# Patient Record
Sex: Female | Born: 1935
Health system: Southern US, Community
[De-identification: ages and names within clinical notes are randomized; demographics above are authoritative.]

## PROBLEM LIST (undated history)

## (undated) DIAGNOSIS — I10 Essential (primary) hypertension: Secondary | ICD-10-CM

## (undated) DIAGNOSIS — F4321 Adjustment disorder with depressed mood: Secondary | ICD-10-CM

## (undated) DIAGNOSIS — M549 Dorsalgia, unspecified: Secondary | ICD-10-CM

## (undated) DIAGNOSIS — E538 Deficiency of other specified B group vitamins: Secondary | ICD-10-CM

## (undated) DIAGNOSIS — S0300XA Dislocation of jaw, unspecified side, initial encounter: Secondary | ICD-10-CM

## (undated) DIAGNOSIS — E049 Nontoxic goiter, unspecified: Secondary | ICD-10-CM

## (undated) HISTORY — PX: KNEE SURGERY: SHX244

## (undated) HISTORY — PX: THYROIDECTOMY: SHX17

## (undated) HISTORY — DX: Dorsalgia, unspecified: M54.9

## (undated) HISTORY — PX: TUBAL LIGATION: SHX77

## (undated) HISTORY — DX: Deficiency of other specified B group vitamins: E53.8

## (undated) HISTORY — DX: Adjustment disorder with depressed mood: F43.21

## (undated) HISTORY — DX: Nontoxic goiter, unspecified: E04.9

## (undated) HISTORY — DX: Dislocation of jaw, unspecified side, initial encounter: S03.00XA

---

## 1997-10-20 ENCOUNTER — Emergency Department (HOSPITAL_COMMUNITY): Admission: EM | Admit: 1997-10-20 | Discharge: 1997-10-20 | Payer: Self-pay | Admitting: *Deleted

## 1998-08-01 ENCOUNTER — Other Ambulatory Visit: Admission: RE | Admit: 1998-08-01 | Discharge: 1998-08-01 | Payer: Self-pay | Admitting: Gynecology

## 1999-10-26 ENCOUNTER — Other Ambulatory Visit: Admission: RE | Admit: 1999-10-26 | Discharge: 1999-10-26 | Payer: Self-pay | Admitting: Gynecology

## 2000-08-15 ENCOUNTER — Other Ambulatory Visit: Admission: RE | Admit: 2000-08-15 | Discharge: 2000-08-15 | Payer: Self-pay | Admitting: Gynecology

## 2002-08-10 ENCOUNTER — Encounter: Payer: Self-pay | Admitting: Gynecology

## 2002-08-10 ENCOUNTER — Ambulatory Visit (HOSPITAL_COMMUNITY): Admission: RE | Admit: 2002-08-10 | Discharge: 2002-08-10 | Payer: Self-pay | Admitting: Family Medicine

## 2002-08-20 ENCOUNTER — Encounter: Admission: RE | Admit: 2002-08-20 | Discharge: 2002-08-20 | Payer: Self-pay | Admitting: Gynecology

## 2002-08-20 ENCOUNTER — Encounter: Payer: Self-pay | Admitting: Gynecology

## 2003-02-16 ENCOUNTER — Encounter: Admission: RE | Admit: 2003-02-16 | Discharge: 2003-02-16 | Payer: Self-pay | Admitting: Family Medicine

## 2003-02-23 ENCOUNTER — Encounter: Admission: RE | Admit: 2003-02-23 | Discharge: 2003-02-23 | Payer: Self-pay | Admitting: Gastroenterology

## 2003-10-13 ENCOUNTER — Ambulatory Visit (HOSPITAL_COMMUNITY): Admission: RE | Admit: 2003-10-13 | Discharge: 2003-10-13 | Payer: Self-pay | Admitting: Family Medicine

## 2004-01-24 ENCOUNTER — Ambulatory Visit: Payer: Self-pay | Admitting: Internal Medicine

## 2004-06-13 ENCOUNTER — Ambulatory Visit: Payer: Self-pay | Admitting: Family Medicine

## 2004-08-02 ENCOUNTER — Ambulatory Visit (HOSPITAL_COMMUNITY): Admission: RE | Admit: 2004-08-02 | Discharge: 2004-08-02 | Payer: Self-pay | Admitting: Endocrinology

## 2004-08-23 ENCOUNTER — Other Ambulatory Visit: Admission: RE | Admit: 2004-08-23 | Discharge: 2004-08-23 | Payer: Self-pay | Admitting: Interventional Radiology

## 2004-08-23 ENCOUNTER — Encounter (INDEPENDENT_AMBULATORY_CARE_PROVIDER_SITE_OTHER): Payer: Self-pay | Admitting: Specialist

## 2004-08-23 ENCOUNTER — Encounter: Admission: RE | Admit: 2004-08-23 | Discharge: 2004-08-23 | Payer: Self-pay | Admitting: Endocrinology

## 2004-10-17 ENCOUNTER — Ambulatory Visit (HOSPITAL_COMMUNITY): Admission: RE | Admit: 2004-10-17 | Discharge: 2004-10-17 | Payer: Self-pay | Admitting: Family Medicine

## 2004-11-20 ENCOUNTER — Ambulatory Visit: Payer: Self-pay | Admitting: Family Medicine

## 2005-02-08 ENCOUNTER — Ambulatory Visit: Payer: Self-pay | Admitting: Family Medicine

## 2005-02-26 ENCOUNTER — Encounter: Admission: RE | Admit: 2005-02-26 | Discharge: 2005-02-26 | Payer: Self-pay | Admitting: Endocrinology

## 2005-03-20 ENCOUNTER — Ambulatory Visit: Payer: Self-pay | Admitting: Internal Medicine

## 2005-05-22 ENCOUNTER — Ambulatory Visit: Payer: Self-pay | Admitting: Internal Medicine

## 2005-07-16 ENCOUNTER — Encounter: Admission: RE | Admit: 2005-07-16 | Discharge: 2005-07-16 | Payer: Self-pay | Admitting: Endocrinology

## 2005-08-06 ENCOUNTER — Ambulatory Visit: Payer: Self-pay | Admitting: Internal Medicine

## 2005-10-02 ENCOUNTER — Ambulatory Visit: Payer: Self-pay | Admitting: Internal Medicine

## 2005-11-02 ENCOUNTER — Ambulatory Visit (HOSPITAL_COMMUNITY): Admission: RE | Admit: 2005-11-02 | Discharge: 2005-11-02 | Payer: Self-pay | Admitting: Internal Medicine

## 2005-12-04 ENCOUNTER — Ambulatory Visit: Payer: Self-pay | Admitting: Internal Medicine

## 2006-01-09 ENCOUNTER — Encounter: Admission: RE | Admit: 2006-01-09 | Discharge: 2006-01-09 | Payer: Self-pay | Admitting: Endocrinology

## 2006-03-06 ENCOUNTER — Ambulatory Visit: Payer: Self-pay | Admitting: Internal Medicine

## 2006-04-22 ENCOUNTER — Ambulatory Visit: Payer: Self-pay | Admitting: Internal Medicine

## 2006-07-22 ENCOUNTER — Ambulatory Visit: Payer: Self-pay | Admitting: Internal Medicine

## 2006-07-22 LAB — CONVERTED CEMR LAB
BUN: 17 mg/dL (ref 6–23)
CO2: 29 meq/L (ref 19–32)
Calcium: 9.4 mg/dL (ref 8.4–10.5)
Chloride: 109 meq/L (ref 96–112)
Creatinine, Ser: 0.7 mg/dL (ref 0.4–1.2)
GFR calc Af Amer: 107 mL/min
GFR calc non Af Amer: 88 mL/min
Glucose, Bld: 90 mg/dL (ref 70–99)
Potassium: 4.1 meq/L (ref 3.5–5.1)
Sodium: 143 meq/L (ref 135–145)
TSH: 0.76 microintl units/mL (ref 0.35–5.50)
Vit D, 1,25-Dihydroxy: 14 — ABNORMAL LOW (ref 20–57)
Vitamin B-12: 263 pg/mL (ref 211–911)

## 2006-12-10 ENCOUNTER — Ambulatory Visit: Payer: Self-pay | Admitting: Internal Medicine

## 2006-12-13 ENCOUNTER — Ambulatory Visit: Payer: Self-pay | Admitting: Internal Medicine

## 2006-12-20 ENCOUNTER — Ambulatory Visit (HOSPITAL_COMMUNITY): Admission: RE | Admit: 2006-12-20 | Discharge: 2006-12-20 | Payer: Self-pay | Admitting: Internal Medicine

## 2007-01-31 ENCOUNTER — Telehealth: Payer: Self-pay | Admitting: Internal Medicine

## 2007-02-05 ENCOUNTER — Encounter: Payer: Self-pay | Admitting: Internal Medicine

## 2007-02-05 ENCOUNTER — Ambulatory Visit: Payer: Self-pay | Admitting: Internal Medicine

## 2007-02-05 DIAGNOSIS — E538 Deficiency of other specified B group vitamins: Secondary | ICD-10-CM | POA: Insufficient documentation

## 2007-02-05 DIAGNOSIS — E042 Nontoxic multinodular goiter: Secondary | ICD-10-CM | POA: Insufficient documentation

## 2007-02-05 DIAGNOSIS — E559 Vitamin D deficiency, unspecified: Secondary | ICD-10-CM | POA: Insufficient documentation

## 2007-02-05 DIAGNOSIS — E1159 Type 2 diabetes mellitus with other circulatory complications: Secondary | ICD-10-CM | POA: Insufficient documentation

## 2007-02-05 DIAGNOSIS — I10 Essential (primary) hypertension: Secondary | ICD-10-CM | POA: Insufficient documentation

## 2007-02-05 DIAGNOSIS — E039 Hypothyroidism, unspecified: Secondary | ICD-10-CM | POA: Insufficient documentation

## 2007-02-07 ENCOUNTER — Encounter: Admission: RE | Admit: 2007-02-07 | Discharge: 2007-02-07 | Payer: Self-pay | Admitting: Internal Medicine

## 2007-03-17 ENCOUNTER — Telehealth (INDEPENDENT_AMBULATORY_CARE_PROVIDER_SITE_OTHER): Payer: Self-pay | Admitting: *Deleted

## 2007-03-17 ENCOUNTER — Encounter: Payer: Self-pay | Admitting: Internal Medicine

## 2007-03-24 ENCOUNTER — Ambulatory Visit: Payer: Self-pay | Admitting: Internal Medicine

## 2007-03-25 DIAGNOSIS — M26629 Arthralgia of temporomandibular joint, unspecified side: Secondary | ICD-10-CM | POA: Insufficient documentation

## 2007-03-25 DIAGNOSIS — F4321 Adjustment disorder with depressed mood: Secondary | ICD-10-CM | POA: Insufficient documentation

## 2007-04-23 LAB — CONVERTED CEMR LAB: Pap Smear: NORMAL

## 2007-05-12 ENCOUNTER — Encounter (INDEPENDENT_AMBULATORY_CARE_PROVIDER_SITE_OTHER): Payer: Self-pay | Admitting: *Deleted

## 2007-05-12 ENCOUNTER — Ambulatory Visit: Payer: Self-pay | Admitting: Internal Medicine

## 2007-05-12 DIAGNOSIS — R229 Localized swelling, mass and lump, unspecified: Secondary | ICD-10-CM | POA: Insufficient documentation

## 2007-05-12 DIAGNOSIS — J069 Acute upper respiratory infection, unspecified: Secondary | ICD-10-CM | POA: Insufficient documentation

## 2007-05-12 LAB — CONVERTED CEMR LAB
BUN: 20 mg/dL (ref 6–23)
CO2: 30 meq/L (ref 19–32)
Calcium: 9.4 mg/dL (ref 8.4–10.5)
Chloride: 104 meq/L (ref 96–112)
Creatinine, Ser: 0.9 mg/dL (ref 0.4–1.2)
Free T4: 0.8 ng/dL (ref 0.6–1.6)
GFR calc Af Amer: 80 mL/min
GFR calc non Af Amer: 66 mL/min
Glucose, Bld: 93 mg/dL (ref 70–99)
Potassium: 4.3 meq/L (ref 3.5–5.1)
Sodium: 141 meq/L (ref 135–145)
TSH: 0.69 microintl units/mL (ref 0.35–5.50)

## 2007-05-15 ENCOUNTER — Encounter: Payer: Self-pay | Admitting: Internal Medicine

## 2007-06-13 ENCOUNTER — Encounter: Admission: RE | Admit: 2007-06-13 | Discharge: 2007-06-13 | Payer: Self-pay | Admitting: Otolaryngology

## 2007-07-02 ENCOUNTER — Encounter: Payer: Self-pay | Admitting: Internal Medicine

## 2007-07-24 ENCOUNTER — Encounter (INDEPENDENT_AMBULATORY_CARE_PROVIDER_SITE_OTHER): Payer: Self-pay | Admitting: Diagnostic Radiology

## 2007-07-24 ENCOUNTER — Other Ambulatory Visit: Admission: RE | Admit: 2007-07-24 | Discharge: 2007-07-24 | Payer: Self-pay | Admitting: Diagnostic Radiology

## 2007-07-24 ENCOUNTER — Encounter: Payer: Self-pay | Admitting: Internal Medicine

## 2007-07-24 ENCOUNTER — Encounter: Admission: RE | Admit: 2007-07-24 | Discharge: 2007-07-24 | Payer: Self-pay | Admitting: Otolaryngology

## 2007-10-03 ENCOUNTER — Ambulatory Visit: Payer: Self-pay | Admitting: Internal Medicine

## 2007-10-03 DIAGNOSIS — M549 Dorsalgia, unspecified: Secondary | ICD-10-CM | POA: Insufficient documentation

## 2007-11-03 ENCOUNTER — Telehealth: Payer: Self-pay | Admitting: Internal Medicine

## 2007-11-13 ENCOUNTER — Encounter (INDEPENDENT_AMBULATORY_CARE_PROVIDER_SITE_OTHER): Payer: Self-pay | Admitting: Otolaryngology

## 2007-11-13 ENCOUNTER — Ambulatory Visit (HOSPITAL_COMMUNITY): Admission: RE | Admit: 2007-11-13 | Discharge: 2007-11-15 | Payer: Self-pay | Admitting: Otolaryngology

## 2007-11-13 ENCOUNTER — Encounter: Payer: Self-pay | Admitting: Internal Medicine

## 2007-12-03 ENCOUNTER — Encounter: Payer: Self-pay | Admitting: Internal Medicine

## 2007-12-22 ENCOUNTER — Ambulatory Visit (HOSPITAL_COMMUNITY): Admission: RE | Admit: 2007-12-22 | Discharge: 2007-12-22 | Payer: Self-pay | Admitting: Internal Medicine

## 2008-01-06 ENCOUNTER — Encounter: Payer: Self-pay | Admitting: Internal Medicine

## 2008-03-25 ENCOUNTER — Ambulatory Visit: Payer: Self-pay | Admitting: Internal Medicine

## 2008-03-25 DIAGNOSIS — K219 Gastro-esophageal reflux disease without esophagitis: Secondary | ICD-10-CM | POA: Insufficient documentation

## 2008-03-25 LAB — CONVERTED CEMR LAB
BUN: 18 mg/dL (ref 6–23)
CO2: 29 meq/L (ref 19–32)
Calcium: 9.4 mg/dL (ref 8.4–10.5)
Chloride: 104 meq/L (ref 96–112)
Cholesterol: 206 mg/dL (ref 0–200)
Creatinine, Ser: 0.8 mg/dL (ref 0.4–1.2)
Direct LDL: 110.9 mg/dL
Folate: 11.2 ng/mL
Free T4: 0.8 ng/dL (ref 0.6–1.6)
GFR calc Af Amer: 91 mL/min
GFR calc non Af Amer: 75 mL/min
Glucose, Bld: 98 mg/dL (ref 70–99)
HDL: 39.4 mg/dL (ref >39.0)
Potassium: 4 meq/L (ref 3.5–5.1)
Sodium: 139 meq/L (ref 135–145)
TSH: 2.21 microintl units/mL (ref 0.35–5.50)
Total CHOL/HDL Ratio: 5.2
Triglycerides: 343 mg/dL (ref 0–149)
VLDL: 69 mg/dL — ABNORMAL HIGH (ref 0–40)
Vitamin B-12: 714 pg/mL (ref 211–911)

## 2008-03-28 ENCOUNTER — Telehealth: Payer: Self-pay | Admitting: Internal Medicine

## 2008-03-28 LAB — CONVERTED CEMR LAB: Vit D, 1,25-Dihydroxy: 37 (ref 30–89)

## 2008-03-30 ENCOUNTER — Encounter: Payer: Self-pay | Admitting: Internal Medicine

## 2008-04-09 HISTORY — PX: EYE SURGERY: SHX253

## 2008-06-29 ENCOUNTER — Encounter: Payer: Self-pay | Admitting: Internal Medicine

## 2008-06-29 ENCOUNTER — Telehealth: Payer: Self-pay | Admitting: Internal Medicine

## 2008-07-12 ENCOUNTER — Encounter: Payer: Self-pay | Admitting: Internal Medicine

## 2009-01-20 ENCOUNTER — Ambulatory Visit (HOSPITAL_COMMUNITY): Admission: RE | Admit: 2009-01-20 | Discharge: 2009-01-20 | Payer: Self-pay | Admitting: Internal Medicine

## 2009-01-26 ENCOUNTER — Encounter: Payer: Self-pay | Admitting: Internal Medicine

## 2009-01-26 ENCOUNTER — Ambulatory Visit: Payer: Self-pay | Admitting: Internal Medicine

## 2009-02-21 ENCOUNTER — Encounter: Payer: Self-pay | Admitting: Internal Medicine

## 2009-02-28 ENCOUNTER — Telehealth: Payer: Self-pay | Admitting: Internal Medicine

## 2009-03-16 ENCOUNTER — Telehealth: Payer: Self-pay | Admitting: Internal Medicine

## 2009-08-25 ENCOUNTER — Telehealth: Payer: Self-pay | Admitting: Internal Medicine

## 2009-10-07 ENCOUNTER — Ambulatory Visit: Payer: Self-pay | Admitting: Internal Medicine

## 2009-10-20 ENCOUNTER — Ambulatory Visit: Payer: Self-pay | Admitting: Internal Medicine

## 2009-10-20 DIAGNOSIS — E1169 Type 2 diabetes mellitus with other specified complication: Secondary | ICD-10-CM | POA: Insufficient documentation

## 2009-10-20 DIAGNOSIS — E781 Pure hyperglyceridemia: Secondary | ICD-10-CM

## 2009-10-20 LAB — CONVERTED CEMR LAB
ALT: 23 units/L (ref 0–35)
AST: 22 units/L (ref 0–37)
Albumin: 4.1 g/dL (ref 3.5–5.2)
Alkaline Phosphatase: 46 units/L (ref 39–117)
BUN: 25 mg/dL — ABNORMAL HIGH (ref 6–23)
Bilirubin, Direct: 0.1 mg/dL (ref 0.0–0.3)
CO2: 27 meq/L (ref 19–32)
Calcium: 9.1 mg/dL (ref 8.4–10.5)
Chloride: 102 meq/L (ref 96–112)
Cholesterol: 294 mg/dL — ABNORMAL HIGH (ref 0–200)
Creatinine, Ser: 0.9 mg/dL (ref 0.4–1.2)
Direct LDL: 72.1 mg/dL
Folate: 14.3 ng/mL
Free T4: 0.8 ng/dL (ref 0.60–1.60)
GFR calc non Af Amer: 63.78 mL/min (ref >60)
Glucose, Bld: 95 mg/dL (ref 70–99)
HDL: 40.9 mg/dL (ref >39.00)
Potassium: 4 meq/L (ref 3.5–5.1)
Sodium: 139 meq/L (ref 135–145)
TSH: 2.27 microintl units/mL (ref 0.35–5.50)
Total Bilirubin: 0.5 mg/dL (ref 0.3–1.2)
Total CHOL/HDL Ratio: 7
Total Protein: 7.1 g/dL (ref 6.0–8.3)
Triglycerides: 1371 mg/dL — ABNORMAL HIGH (ref 0.0–149.0)
VLDL: 274.2 mg/dL — ABNORMAL HIGH (ref 0.0–40.0)
Vit D, 25-Hydroxy: 27 ng/mL — ABNORMAL LOW (ref 30–89)
Vitamin B-12: 432 pg/mL (ref 211–911)

## 2009-10-23 ENCOUNTER — Telehealth: Payer: Self-pay | Admitting: Internal Medicine

## 2010-01-03 ENCOUNTER — Telehealth (INDEPENDENT_AMBULATORY_CARE_PROVIDER_SITE_OTHER): Payer: Self-pay | Admitting: *Deleted

## 2010-01-31 ENCOUNTER — Ambulatory Visit (HOSPITAL_COMMUNITY): Admission: RE | Admit: 2010-01-31 | Discharge: 2010-01-31 | Payer: Self-pay | Admitting: Internal Medicine

## 2010-02-20 ENCOUNTER — Ambulatory Visit: Payer: Self-pay | Admitting: Internal Medicine

## 2010-04-25 ENCOUNTER — Telehealth: Payer: Self-pay | Admitting: Internal Medicine

## 2010-04-26 ENCOUNTER — Telehealth: Payer: Self-pay | Admitting: Internal Medicine

## 2010-04-27 ENCOUNTER — Ambulatory Visit
Admission: RE | Admit: 2010-04-27 | Discharge: 2010-04-27 | Payer: Self-pay | Source: Home / Self Care | Attending: Internal Medicine | Admitting: Internal Medicine

## 2010-04-27 DIAGNOSIS — M79609 Pain in unspecified limb: Secondary | ICD-10-CM | POA: Insufficient documentation

## 2010-04-30 ENCOUNTER — Encounter: Payer: Self-pay | Admitting: Endocrinology

## 2010-04-30 ENCOUNTER — Encounter: Payer: Self-pay | Admitting: Internal Medicine

## 2010-05-11 NOTE — Progress Notes (Signed)
  Phone Note Outgoing Call   Reason for Call: Discuss lab or test results Summary of Call: please call patient:  1. triglycerides were very high and she will need to start tricor - Rx to pharmacy 2. Vit D was low - she will need D3 50,000 weekly - Rx to pharmacy 3. repeat labs 4 weeks after starting tricor  Thanks Initial call taken by: Jacques Navy MD,  October 23, 2009 6:09 PM  Follow-up for Phone Call        lmoam for pt to call back Follow-up by: Ami Bullins CMA,  October 24, 2009 11:20 AM  Additional Follow-up for Phone Call Additional follow up Details #1::        informed pt Additional Follow-up by: Ami Bullins CMA,  October 24, 2009 2:45 PM

## 2010-05-11 NOTE — Assessment & Plan Note (Signed)
Summary: BREAST IS BRUISED AND BACK PAIN AFTER MAMMOGRAM ON 10-25 /NWS   Vital Signs:  Patient profile:   75 year old female Height:      63 inches Weight:      177 pounds BMI:     31.47 O2 Sat:      96 % on Room air Temp:     98.5 degrees F oral Pulse rate:   67 / minute BP sitting:   132 / 80  (left arm) Cuff size:   regular  Vitals Entered By: Bill Salinas CMA (February 20, 2010 1:10 PM)  O2 Flow:  Room air CC: pt had a mammogram on  Jan 31, 2010, she states since that exam she has had continued left breast soreness with back pain/ ab   Primary Care Ed Rayson:  [facility]  CC:  pt had a mammogram on  Jan 31, 2010, and she states since that exam she has had continued left breast soreness with back pain/ ab.  History of Present Illness: Mrs. Rocha presents today with pain under her left breast and back that has been there since having a mammogram on October 25th.  While having the mammogram her breast was smashed much harder than it has ever been before.  She states that at the time she noticed that she even had some blood blister develope due to the pressure of the machine.  Two days after she noticed that her nipples turned black, and this eventually resolved.  Since then she has had a pain under her left breast that feels more achy in nature.  She states that this pain is present when she presses the area as well.  The pain in her back is more of a deep sharp pain that is present under her scapula.  She only notices the pain when she breaths.  She has tried Aleve to help with the pain but it did not.  She states that it is not getting worse, but it is also not gettingany better.  The pain is not bad enough to keep her from sleeping or wake her from sleep.    She denies any abdominal pain, SOB with exertion, chest pain, palpitations, tingling or numbness in her jaw or extremities.    Current Medications (verified): 1)  Levothroid 50 Mcg Tabs (Levothyroxine Sodium) .Marland Kitchen.. 1 Qd 2)   Lovastatin 20 Mg Tabs (Lovastatin) .Marland Kitchen.. 1 Qd 3)  Vitamin D3 1000 Unit  Tabs (Cholecalciferol) .Marland Kitchen.. 1 Qd 4)  Vitamin B-12 1000 Mcg  Tabs (Cyanocobalamin) .Marland Kitchen.. 1 Qd 5)  Aleve 220 Mg  Tabs (Naproxen Sodium) .... As Needed 6)  Calcium 600 1500 Mg  Tabs (Calcium Carbonate) .... Take 1 Tablet By Mouth Two Times A Day 7)  Adult Aspirin Low Strength 81 Mg  Tbdp (Aspirin) .... Take 1 Tablet By Mouth Once A Day 8)  Alprazolam 0.25 Mg  Tabs (Alprazolam) .... As Directed As Needed 9)  Bystolic 10 Mg Tabs (Nebivolol Hcl) .Marland Kitchen.. 1 By Mouth Once Daily 10)  Furosemide 20 Mg Tabs (Furosemide) .Marland Kitchen.. 1po Once Daily 11)  Ranitidine Hcl 150 Mg Caps (Ranitidine Hcl) .Marland Kitchen.. 1 By Mouth Two Times A Day 12)  Neomycin-Polymyxin-Hc 3.5-10000-1 Soln (Neomycin-Polymyxin-Hc) .... 4 Qtts As Three Times A Day 13)  Tricor 48 Mg Tabs (Fenofibrate) .Marland Kitchen.. 1 By Mouth Q Pm 14)  Vitamin D (Ergocalciferol) 50000 Unit Caps (Ergocalciferol) .Marland Kitchen.. 1 By Mouth Weekly X 8 Weeks  Allergies (verified): 1)  ! Codeine  Past History:  Past Medical  History: Last updated: 10/03/2007 Goiter B12 def Vit D def TMJ right grief and loss related depression '08 back pain - chronic  Past Surgical History: Last updated: 03/25/2008 knee surgery-right arthroscopy '05  Dr. Charlann Boxer Tubal ligation Thyroidectomy-August '09 for goiter (Dr. Jearld Fenton)  Family History: Last updated: 02/05/2007 Family History of CAD Female 1st degree relative <50  Social History: Last updated: 10/20/2009 HSG, maiden 2 sons- 2060/09/11 (died Lung cancer 07-Sep-2022), 09-12-67; 3 grandchildren Retired- cutter in Scientist, clinical (histocompatibility and immunogenetics); work in [employer]. Lives alone: I-ADLs, lives in Sr Citizen's complex. End of life issues: patient would want CPR, DNI, not to be maintained in a vegative state. Laymen's guide packet provided (July '11)  Risk Factors: Alcohol Use: 0 (10/20/2009) Caffeine Use: 0 (03/25/2008) Exercise: no (03/25/2008)  Risk Factors: Smoking Status:  never (10/20/2009)  Review of Systems  The patient denies anorexia, fever, weight loss, weight gain, vision loss, decreased hearing, hoarseness, chest pain, syncope, dyspnea on exertion, peripheral edema, prolonged cough, headaches, hemoptysis, abdominal pain, melena, hematochezia, severe indigestion/heartburn, hematuria, incontinence, muscle weakness, suspicious skin lesions, difficulty walking, depression, unusual weight change, abnormal bleeding, and breast masses.    Physical Exam  General:  alert, well-developed, and well-nourished.   Head:  normocephalic and atraumatic.   Eyes:  pupils equal, pupils round, pupils reactive to light, pupils react to accomodation, and corneas and lenses clear.   Ears:  R ear normal and L ear normal.   Nose:  no external deformity, no external erythema, and no nasal discharge.   Mouth:  good dentition, pharynx pink and moist, no erythema, and no exudates.   Neck:  supple, full ROM, no masses, and no cervical lymphadenopathy.   Chest Wall:  no deformities and no mass.  Tenderness to palpation under left breast - in the T5 dermatome/ in the intercostal space under the breast and in the intercostal space (same space) at the lower aspect of the scapula  Breasts:  skin/areolae normal, no abnormal thickening, no nipple discharge, no tenderness, and no adenopathy.  A small cystic structure felt in the 9-7 o'clock position of the left breast - felt on previous exams.  Slight left nipple retraction - normal.  No signs of bruising. Lungs:  normal respiratory effort, normal breath sounds, no dullness, no crackles, and no wheezes.   Heart:  normal rate, regular rhythm, no murmur, no gallop, no rub, and PMI normal.   Abdomen:  soft, non-tender, normal bowel sounds, no distention, no masses, no guarding, no rigidity, no abdominal hernia, and no hepatomegaly.   Msk:  Pain in the T5 dermatome on the with deep breathing.  Pain to palpation in the region of the T5 dermatome.   normal ROM and no joint tenderness.   Pulses:  R radial normal, R posterior tibial normal, R dorsalis pedis normal, L radial normal, L posterior tibial normal, and L dorsalis pedis normal.   Extremities:  No edema, cyanosis, clubbing Neurologic:  alert & oriented X3, cranial nerves II-XII intact, and gait normal.   Skin:  turgor normal and color normal.   Cervical Nodes:  no anterior cervical adenopathy and no posterior cervical adenopathy.   Axillary Nodes:  no R axillary adenopathy and no L axillary adenopathy.   Psych:  Oriented X3, memory intact for recent and remote, normally interactive, good eye contact, not anxious appearing, and not depressed appearing.     Impression & Recommendations:  Problem # 1:  BACK PAIN (ICD-724.5) The back pain she is having is either a  MSK problem vs. nerve damage 2/2 mammogram vs. Cardiac vs. pulmonary.  The pain does not seem to be cardiac because it can be replicated on physical exam and she has no signs of MI.  Since the lungs are clear and she has no SOB pluritic pain seems to be less likely. The pain that she if feeling seems to be secondary to the consequences from her mamogram.  SInce it can be localized to a specific dermatome and the area is the same both dorsally and ventrally what probably happened is that the trauma of the mammogram bruised her anteriorly and cause irritation of the intercostal nerve.  This pain is worse when she breaths because it is moving ner nerve and muscles in the costal space.  Plan:  Try heat on area of pain.           Continue Aleve for pain as needed.  Her updated medication list for this problem includes:    Aleve 220 Mg Tabs (Naproxen sodium) .Marland Kitchen... As needed    Adult Aspirin Low Strength 81 Mg Tbdp (Aspirin) .Marland Kitchen... Take 1 tablet by mouth once a day  Complete Medication List: 1)  Levothroid 50 Mcg Tabs (Levothyroxine sodium) .Marland Kitchen.. 1 qd 2)  Lovastatin 20 Mg Tabs (Lovastatin) .Marland Kitchen.. 1 qd 3)  Vitamin D3 1000 Unit Tabs  (Cholecalciferol) .Marland Kitchen.. 1 qd 4)  Vitamin B-12 1000 Mcg Tabs (Cyanocobalamin) .Marland Kitchen.. 1 qd 5)  Aleve 220 Mg Tabs (Naproxen sodium) .... As needed 6)  Calcium 600 1500 Mg Tabs (Calcium carbonate) .... Take 1 tablet by mouth two times a day 7)  Adult Aspirin Low Strength 81 Mg Tbdp (Aspirin) .... Take 1 tablet by mouth once a day 8)  Alprazolam 0.25 Mg Tabs (Alprazolam) .... As directed as needed 9)  Bystolic 10 Mg Tabs (Nebivolol hcl) .Marland Kitchen.. 1 by mouth once daily 10)  Furosemide 20 Mg Tabs (Furosemide) .Marland Kitchen.. 1po once daily 11)  Ranitidine Hcl 150 Mg Caps (Ranitidine hcl) .Marland Kitchen.. 1 by mouth two times a day 12)  Neomycin-polymyxin-hc 3.5-10000-1 Soln (Neomycin-polymyxin-hc) .... 4 qtts as three times a day 13)  Tricor 48 Mg Tabs (Fenofibrate) .Marland Kitchen.. 1 by mouth q pm 14)  Vitamin D (ergocalciferol) 50000 Unit Caps (Ergocalciferol) .Marland Kitchen.. 1 by mouth weekly x 8 weeks Prescriptions: BYSTOLIC 10 MG TABS (NEBIVOLOL HCL) 1 by mouth once daily  #30 Each x 12   Entered and Authorized by:   Jacques Navy MD   Signed by:   Jacques Navy MD on 02/20/2010   Method used:   Electronically to        [facility] [address] (retail)       [phone]       [place], [place]  [address]       Ph: [phone]       Fax: [phone]   RxID:   [id number] LEVOTHROID 50 MCG TABS (LEVOTHYROXINE SODIUM) 1 qd  #90 Each x 3   Entered and Authorized by:   Jacques Navy MD   Signed by:   Jacques Navy MD on 02/20/2010   Method used:   Electronically to        [facility] [address] (retail)       [phone]       [place], [place]  [address]       Ph: [phone]  Fax: [phone]   RxID:   [id number]    Orders Added: 1)  Est. Patient Level III [[address]]

## 2010-05-11 NOTE — Progress Notes (Signed)
    Immunization History:  Influenza Immunization History:    Influenza:  left deltoid lot # [id number] 0.66ml (01/03/2010)

## 2010-05-11 NOTE — Progress Notes (Signed)
Summary: KNOT ON FOOT - ov tomorrow  Phone Note Call from Patient Call back at Home Phone [phone]   Summary of Call: Ret call - wants a call back.  Initial call taken by: Lamar Sprinkles, CMA,  April 26, 2010 4:36 PM  Follow-up for Phone Call        scheduled for office visit tomorrow for eval - has painful knot on left foot x 1 mth. Advise eval by PCP first, pt agreed Follow-up by: Lamar Sprinkles, CMA,  April 26, 2010 4:42 PM

## 2010-05-11 NOTE — Assessment & Plan Note (Signed)
Summary: BLEEDING LEFT EAR  STC   Vital Signs:  Patient profile:   75 year old female Height:      63 inches Weight:      179 pounds BMI:     31.82 O2 Sat:      96 % on Room air Temp:     98.1 degrees F oral Pulse rate:   63 / minute BP sitting:   158 / 90  (left arm) Cuff size:   regular  Vitals Entered By: Bill Salinas CMA (October 07, 2009 1:57 PM)  O2 Flow:  Room air CC: pt here with c/o bleeding from Left ear. Pt had episode of dizziness about 1 month ago/ ab   Primary Care Provider:  Norins  CC:  pt here with c/o bleeding from Left ear. Pt had episode of dizziness about 1 month ago/ ab.  History of Present Illness: Patient is seen acutely for blood coming from the left ear. No pain, no change in hearing. This started [facility] and has continue with small amounts of blood since. She does use "Q" tips to clean her ears. No other recollection of trauma or FB in the ear.   Current Medications (verified): 1)  Levothroid 50 Mcg Tabs (Levothyroxine Sodium) .Marland Kitchen.. 1 Qd 2)  Lovastatin 20 Mg Tabs (Lovastatin) .Marland Kitchen.. 1 Qd 3)  Vitamin D3 1000 Unit  Tabs (Cholecalciferol) .Marland Kitchen.. 1 Qd 4)  Vitamin B-12 1000 Mcg  Tabs (Cyanocobalamin) .Marland Kitchen.. 1 Qd 5)  Aleve 220 Mg  Tabs (Naproxen Sodium) .... As Needed 6)  Calcium 600 1500 Mg  Tabs (Calcium Carbonate) .... Take 1 Tablet By Mouth Two Times A Day 7)  Adult Aspirin Low Strength 81 Mg  Tbdp (Aspirin) .... Take 1 Tablet By Mouth Once A Day 8)  Alprazolam 0.25 Mg  Tabs (Alprazolam) .... As Directed As Needed 9)  Bystolic 10 Mg Tabs (Nebivolol Hcl) .Marland Kitchen.. 1 By Mouth Once Daily 10)  Furosemide 20 Mg Tabs (Furosemide) .Marland Kitchen.. 1po Once Daily 11)  Ranitidine Hcl 150 Mg Caps (Ranitidine Hcl) .Marland Kitchen.. 1 By Mouth Two Times A Day  Allergies: 1)  ! Codeine PMH-FH-SH reviewed-no changes except otherwise noted  Review of Systems  The patient denies anorexia, fever, weight loss, weight gain, vision loss, decreased hearing, chest pain, peripheral edema, abdominal pain,  hematochezia, incontinence, suspicious skin lesions, difficulty walking, and abnormal bleeding.    Physical Exam  General:  Well-developed,well-nourished,in no acute distress; alert,appropriate and cooperative throughout examination Head:  normocephalic and atraumatic.   Ears:  Right EAC normal. Left EAC with excoriation on the posterior wall with some dried blood on the floor of the canal.  Neck:  supple and full ROM.   Lungs:  normal respiratory effort.   Heart:  normal rate and regular rhythm.   Msk:  normal ROM.   Neurologic:  alert & oriented X3, cranial nerves II-XII intact, strength normal in all extremities, and gait normal.   Skin:  turgor normal and color normal.   Cervical Nodes:  no anterior cervical adenopathy and no posterior cervical adenopathy.     Impression & Recommendations:  Problem # 1:  INJURY OF FACE AND NECK OTHER AND UNSPECIFIED (ICD-959.09) Patient with excoriation of Left EAC as source of bleeding. TM looked normal.   Plan - neomycin/polymiinB/hydrocotrisone qtts 4 qtts to AS three times a day  Complete Medication List: 1)  Levothroid 50 Mcg Tabs (Levothyroxine sodium) .Marland Kitchen.. 1 qd 2)  Lovastatin 20 Mg Tabs (Lovastatin) .Marland Kitchen.. 1 qd 3)  Vitamin D3 1000 Unit Tabs (Cholecalciferol) .Marland Kitchen.. 1 qd 4)  Vitamin B-12 1000 Mcg Tabs (Cyanocobalamin) .Marland Kitchen.. 1 qd 5)  Aleve 220 Mg Tabs (Naproxen sodium) .... As needed 6)  Calcium 600 1500 Mg Tabs (Calcium carbonate) .... Take 1 tablet by mouth two times a day 7)  Adult Aspirin Low Strength 81 Mg Tbdp (Aspirin) .... Take 1 tablet by mouth once a day 8)  Alprazolam 0.25 Mg Tabs (Alprazolam) .... As directed as needed 9)  Bystolic 10 Mg Tabs (Nebivolol hcl) .Marland Kitchen.. 1 by mouth once daily 10)  Furosemide 20 Mg Tabs (Furosemide) .Marland Kitchen.. 1po once daily 11)  Ranitidine Hcl 150 Mg Caps (Ranitidine hcl) .Marland Kitchen.. 1 by mouth two times a day 12)  Neomycin-polymyxin-hc 3.5-10000-1 Soln (Neomycin-polymyxin-hc) .... 4 qtts as three times a  day Prescriptions: ALPRAZOLAM 0.25 MG  TABS (ALPRAZOLAM) as directed as needed  #60 x 2   Entered and Authorized by:   Jacques Navy MD   Signed by:   Jacques Navy MD on 10/07/2009   Method used:   Handwritten   RxID:   [id number] FUROSEMIDE 20 MG TABS (FUROSEMIDE) 1po once daily  #30 Each x 12   Entered and Authorized by:   Jacques Navy MD   Signed by:   Jacques Navy MD on 10/07/2009   Method used:   Electronically to        [facility] [address] (retail)       [phone]       [place], [place]  [address]       Ph: [phone]       Fax: [phone]   RxID:   [id number] LOVASTATIN 20 MG TABS (LOVASTATIN) 1 qd  #90 Each x 1   Entered and Authorized by:   Jacques Navy MD   Signed by:   Jacques Navy MD on 10/07/2009   Method used:   Electronically to        [facility] [address] (retail)       [phone]       [place], [place]  [address]       Ph: [phone]       Fax: [phone]   RxID:   [id number] NEOMYCIN-POLYMYXIN-HC 3.5-10000-1 SOLN [facility]) 4 qtts AS three times a day  #10cc x 0   Entered and Authorized by:   Jacques Navy MD   Signed by:   Jacques Navy MD on 10/07/2009   Method used:   Electronically to        [facility] [address] (retail)       [phone]       [place], [place]  [address]       Ph: [phone]       Fax: [phone]   RxID:   [id number]    Preventive Care Screening  Last Flu Shot:    Date:  01/31/2009    Results:  given   Bone Density:    Date:  01/26/2009    Results:  normal std dev  Colonoscopy:    Date:  02/09/2003    Results:  normal

## 2010-05-11 NOTE — Assessment & Plan Note (Signed)
Summary: PER MD EXT OV--BACK AND BODY ACHES AND SWELLING--STC   Vital Signs:  Patient profile:   75 year old female Height:      63 inches Weight:      181 pounds BMI:     32.18 O2 Sat:      96 % on Room air Temp:     98.7 degrees F oral Pulse rate:   64 / minute BP sitting:   136 / 78  (left arm) Cuff size:   regular  Vitals Entered By: Bill Salinas CMA (October 20, 2009 2:54 PM)  O2 Flow:  Room air CC: pt here for cpx/ ab   Primary Care Provider:  Norins  CC:  pt here for cpx/ ab.  History of Present Illness: Patient presents for routine exam. She is feelng well. she is current with mammography. Still having some back pain but this is usual.  Preventive Screening-Counseling & Management  Alcohol-Tobacco     Alcohol drinks/day: 0     Smoking Status: never  Current Medications (verified): 1)  Levothroid 50 Mcg Tabs (Levothyroxine Sodium) .Marland Kitchen.. 1 Qd 2)  Lovastatin 20 Mg Tabs (Lovastatin) .Marland Kitchen.. 1 Qd 3)  Vitamin D3 1000 Unit  Tabs (Cholecalciferol) .Marland Kitchen.. 1 Qd 4)  Vitamin B-12 1000 Mcg  Tabs (Cyanocobalamin) .Marland Kitchen.. 1 Qd 5)  Aleve 220 Mg  Tabs (Naproxen Sodium) .... As Needed 6)  Calcium 600 1500 Mg  Tabs (Calcium Carbonate) .... Take 1 Tablet By Mouth Two Times A Day 7)  Adult Aspirin Low Strength 81 Mg  Tbdp (Aspirin) .... Take 1 Tablet By Mouth Once A Day 8)  Alprazolam 0.25 Mg  Tabs (Alprazolam) .... As Directed As Needed 9)  Bystolic 10 Mg Tabs (Nebivolol Hcl) .Marland Kitchen.. 1 By Mouth Once Daily 10)  Furosemide 20 Mg Tabs (Furosemide) .Marland Kitchen.. 1po Once Daily 11)  Ranitidine Hcl 150 Mg Caps (Ranitidine Hcl) .Marland Kitchen.. 1 By Mouth Two Times A Day 12)  Neomycin-Polymyxin-Hc 3.5-10000-1 Soln (Neomycin-Polymyxin-Hc) .... 4 Qtts As Three Times A Day  Allergies (verified): 1)  ! Codeine  Past History:  Past Medical History: Last updated: 10/03/2007 Goiter B12 def Vit D def TMJ right grief and loss related depression '08 back pain - chronic  Past Surgical History: Last updated:  03/25/2008 knee surgery-right arthroscopy '05  Dr. Charlann Boxer Tubal ligation Thyroidectomy-August '09 for goiter (Dr. Jearld Fenton)  Family History: Last updated: 02/05/2007 Family History of CAD Female 1st degree relative <50  Social History: Last updated: 10/20/2009 HSG, maiden 2 sons- 2060/08/21 (died Lung cancer 08-17-2022), 08-22-67; 3 grandchildren Retired- cutter in Scientist, clinical (histocompatibility and immunogenetics); work in [employer]. Lives alone: I-ADLs, lives in Sr Citizen's complex. End of life issues: patient would want CPR, DNI, not to be maintained in a vegative state. Laymen's guide packet provided (July '11)  Risk Factors: Smoking Status: never (10/20/2009)  Social History: HSG, maiden 2 sons- 08/21/60 (died Lung cancer 08/17/22), Aug 22, 2067; 3 grandchildren RetiredProduction manager in Scientist, clinical (histocompatibility and immunogenetics); work in [employer]. Lives alone: I-ADLs, lives in Sr Citizen's complex. End of life issues: patient would want CPR, DNI, not to be maintained in a vegative state. Laymen's guide packet provided (July '11)  Review of Systems       The patient complains of peripheral edema and severe indigestion/heartburn.  The patient denies anorexia, fever, weight loss, weight gain, vision loss, decreased hearing, chest pain, dyspnea on exertion, prolonged cough, abdominal pain, incontinence, muscle weakness, suspicious skin lesions, difficulty walking, depression, abnormal bleeding, enlarged lymph nodes, angioedema, and breast  masses.    Physical Exam  General:  WNWD white female in no distress Head:  normocephalic and atraumatic.   Eyes:  No corneal or conjunctival inflammation noted. EOMI. Perrla. Funduscopic exam benign, without hemorrhages, exudates or papilledema. Vision grossly normal. IOLs clear Ears:  External ear exam shows no significant lesions or deformities.  Otoscopic examination reveals clear canals, tympanic membranes are intact bilaterally without bulging, retraction, inflammation or  discharge. Hearing is grossly normal bilaterally. Nose:  no external deformity and no external erythema.   Mouth:  Oral mucosa and oropharynx without lesions or exudates.  Teeth in good repair. Neck:  supple, no masses, no thyromegaly, and no carotid bruits.   Chest Wall:  No deformities, masses, or tenderness noted. Breasts:  No mass, nodules, thickening, tenderness, bulging, retraction, inflamation, nipple discharge or skin changes noted.  Mild fibrocystic changes noted Lungs:  Normal respiratory effort, chest expands symmetrically. Lungs are clear to auscultation, no crackles or wheezes. Heart:  Normal rate and regular rhythm. S1 and S2 normal without gallop, murmur, click, rub or other extra sounds. Abdomen:  soft, non-tender, normal bowel sounds, no guarding, no rigidity, and no hepatomegaly.   Msk:  normal ROM, no joint tenderness, no joint swelling, no joint warmth, no joint deformities, and no joint instability.   Pulses:  2+ radial and DP pulses Extremities:  No clubbing, cyanosis, edema, or deformity noted with normal full range of motion of all joints.   Neurologic:  alert & oriented X3, cranial nerves II-XII intact, strength normal in all extremities, gait normal, and DTRs symmetrical and normal.   Skin:  turgor normal, color normal, no rashes, and no suspicious lesions.   Cervical Nodes:  no anterior cervical adenopathy and no posterior cervical adenopathy.   Axillary Nodes:  no R axillary adenopathy and no L axillary adenopathy.   Psych:  Oriented X3, memory intact for recent and remote, normally interactive, good eye contact, and not anxious appearing.     Impression & Recommendations:  Problem # 1:  OTHER AND UNSPECIFIED HYPERLIPIDEMIA (ICD-272.4)  Her updated medication list for this problem includes:    Lovastatin 20 Mg Tabs (Lovastatin) .Marland Kitchen... 1 qd    Tricor 48 Mg Tabs (Fenofibrate) .Marland Kitchen... 1 by mouth q pm  Orders: TLB-Lipid Panel (80061-LIPID) TLB-Hepatic/Liver Function  Pnl (80076-HEPATIC)  Paient with markedly elevated triglycerides with an LDL of 72.   Plan - continue low dose lovastatin           add tricor 48mg  by mouth at dinner           low at diet  Problem # 2:  GERD (ICD-530.81) stable on present medication  Her updated medication list for this problem includes:    Ranitidine Hcl 150 Mg Caps (Ranitidine hcl) .Marland Kitchen... 1 by mouth two times a day  Problem # 3:  HYPOTHYROIDISM, UNSPECIFIED (ICD-244.9)  Her updated medication list for this problem includes:    Levothroid 50 Mcg Tabs (Levothyroxine sodium) .Marland Kitchen... 1 qd  Orders: TLB-TSH (Thyroid Stimulating Hormone) (84443-TSH) TLB-T4 (Thyrox), Free [phone])  Lab results indicate good control on present medication  Problem # 4:  HYPERTENSION (ICD-401.1)  Her updated medication list for this problem includes:    Bystolic 10 Mg Tabs (Nebivolol hcl) .Marland Kitchen... 1 by mouth once daily    Furosemide 20 Mg Tabs (Furosemide) .Marland Kitchen... 1po once daily  Orders: TLB-BMP (Basic Metabolic Panel-BMET) (80048-METABOL)  BP today: 136/78 Prior BP: 158/90 (10/07/2009)  Adequate control on present medications - will continue the  same.  Problem # 5:  VITAMIN D DEFICIENCY (ICD-268.9)  Orders: T-Vitamin D (25-Hydroxy) ([id number])  Vitamin D level 27 = insufficient  Plan - Ergocalcifierol 50,000 units weeky x 8 weeks, then otc Vit D 1000 international units daily.  Problem # 6:  B12 DEFICIENCY (ICD-266.2)  Orders: TLB-B12 + Folate Pnl (62952_84132-G40/NUU)  B12 level in normal range.  Problem # 7:  Preventive Health Care (ICD-V70.0) Normal physical exam. Lab results are OK except for triglycerides and Vit D. She is current with mammography and colorectal cancer screening.  She is a candidate for immunizations: pneumovax, Zostavax at her next visit.   She remains independent in all ADLs. She is in good spirits with no evidence of depressed mood. She has minimal fall risk and is careful and at minimal risk  for injury.  In summary - a delightful woman who appears medically stable who will need an adjustment in medications as outlined above. she will return for lipid panel in 4 months. She will return for follow-up visit in 6 months.   Complete Medication List: 1)  Levothroid 50 Mcg Tabs (Levothyroxine sodium) .Marland Kitchen.. 1 qd 2)  Lovastatin 20 Mg Tabs (Lovastatin) .Marland Kitchen.. 1 qd 3)  Vitamin D3 1000 Unit Tabs (Cholecalciferol) .Marland Kitchen.. 1 qd 4)  Vitamin B-12 1000 Mcg Tabs (Cyanocobalamin) .Marland Kitchen.. 1 qd 5)  Aleve 220 Mg Tabs (Naproxen sodium) .... As needed 6)  Calcium 600 1500 Mg Tabs (Calcium carbonate) .... Take 1 tablet by mouth two times a day 7)  Adult Aspirin Low Strength 81 Mg Tbdp (Aspirin) .... Take 1 tablet by mouth once a day 8)  Alprazolam 0.25 Mg Tabs (Alprazolam) .... As directed as needed 9)  Bystolic 10 Mg Tabs (Nebivolol hcl) .Marland Kitchen.. 1 by mouth once daily 10)  Furosemide 20 Mg Tabs (Furosemide) .Marland Kitchen.. 1po once daily 11)  Ranitidine Hcl 150 Mg Caps (Ranitidine hcl) .Marland Kitchen.. 1 by mouth two times a day 12)  Neomycin-polymyxin-hc 3.5-10000-1 Soln (Neomycin-polymyxin-hc) .... 4 qtts as three times a day 13)  Tricor 48 Mg Tabs (Fenofibrate) .Marland Kitchen.. 1 by mouth q pm 14)  Vitamin D (ergocalciferol) 50000 Unit Caps (Ergocalciferol) .Marland Kitchen.. 1 by mouth weekly x 8 weeks  Other Orders: Subsequent annual wellness visit with prevention plan (V2536)   Patient: Kayla Craig Note: All result statuses are Final unless otherwise noted.  Tests: (1) TSH (TSH)   FastTSH                   2.27 uIU/mL                 0.35-5.50  Tests: (2) T4, Free (FT4R)   Free T4                   0.80 ng/dL                  0.60-1.60  Tests: (3) BMP (METABOL)   Sodium                    139 mEq/L                   135-145   Potassium                 4.0 mEq/L                   3.5-5.1   Chloride  102 mEq/L                   96-112   Carbon Dioxide            27 mEq/L                    19-32   Glucose                   95  mg/dL                    04-54   BUN                  [H]  25 mg/dL                    0-98   Creatinine                0.9 mg/dL                   1.1-9.1   Calcium                   9.1 mg/dL                   4.7-82.9   GFR                       63.78 mL/min                >60  Tests: (4) B12 + Folate Panel (B12/FOL)   Vitamin B12               432 pg/mL                   211-911   Folate                    14.3 ng/mL Tests: (1) Vitamin D (25-Hydroxy) (56213)  Vitamin D (25-Hydroxy)                        [L]  27 ng/mL                    30-89     This assay accurately quantifies Vitamin D, which is the sum of the     25-Hydroxy forms of Vitamin D2 and D3.  Studies have shown that the     optimum concentration of 25-Hydroxy Vitamin D is 30 ng/mL or higher.      Concentrations of Vitamin D between 20 and 29 ng/mL are considered to     be insufficient and concentrations less than 20 ng/mL are considered     to be deficient for Vitamin D.  Note: All result statuses are Final unless otherwise noted.  Tests: (1) Lipid Panel (LIPID)   Cholesterol          [H]  294 mg/dL                   0-865     ATP III Classification            Desirable:  < 200 mg/dL                    Borderline High:  200 - 239 mg/dL  High:  > = 240 mg/dL   Triglycerides        [H]  1371.0 mg/dL                6.2-130.8     Normal:  <150 mg/dL     Borderline High:  657 - 199 mg/dL     Verified by manual dilution.   HDL                       84.69 mg/dL                 >62.95   VLDL Cholesterol     [H]  274.2 mg/dL                 2.8-41.3  CHO/HDL Ratio:  CHD Risk                             7                    Men          Women     1/2 Average Risk     3.4          3.3     Average Risk          5.0          4.4     2X Average Risk          9.6          7.1     3X Average Risk          15.0          11.0                           Tests: (2) Hepatic/Liver Function Panel (HEPATIC)   Total  Bilirubin           0.5 mg/dL                   2.4-4.0   Direct Bilirubin          0.1 mg/dL                   1.0-2.7   Alkaline Phosphatase      46 U/L                      39-117   AST                       22 U/L                      0-37   ALT                       23 U/L                      0-35   Total Protein             7.1 g/dL                    2.5-3.6   Albumin                   4.1 g/dL  3.5-5.2  Tests: (3) Cholesterol LDL - Direct (DIRLDL)   Order Note: Verified by manual dilution.  Cholesterol LDL - Direct                             72.1 mg/dLPrescriptions: VITAMIN D (ERGOCALCIFEROL) 50000 UNIT CAPS (ERGOCALCIFEROL) 1 by mouth weekly x 8 weeks  #8 x 0   Entered and Authorized by:   Jacques Navy MD   Signed by:   Jacques Navy MD on 10/23/2009   Method used:   Electronically to        [facility] [address] (retail)       [phone]       [place], [place]  [address]       Ph: [phone]       Fax: [phone]   RxID:   [id number] TRICOR 48 MG TABS (FENOFIBRATE) 1 by mouth q PM  #30 x 12   Entered and Authorized by:   Jacques Navy MD   Signed by:   Jacques Navy MD on 10/23/2009   Method used:   Electronically to        [facility] [address] (retail)       [phone]       [place], [place]  [address]       Ph: [phone]       Fax: [phone]   RxID:   [id number]

## 2010-05-11 NOTE — Progress Notes (Signed)
  Phone Note Refill Request Message from:  Fax from Pharmacy on Aug 25, 2009 3:55 PM  Refills Requested: Medication #1:  LEVOTHROID 50 MCG TABS 1 qd  Medication #2:  BYSTOLIC 10 MG TABS 1 by mouth once daily Initial call taken by: Ami Bullins CMA,  Aug 25, 2009 3:55 PM    Prescriptions: BYSTOLIC 10 MG TABS (NEBIVOLOL HCL) 1 by mouth once daily  #30 Each x 6   Entered by:   Ami Bullins CMA   Authorized by:   Jacques Navy MD   Signed by:   Bill Salinas CMA on 08/25/2009   Method used:   Electronically to        [facility] [address] (retail)       [phone]       [place], [place]  [address]       Ph: [phone]       Fax: [phone]   RxID:   [id number] LEVOTHROID 50 MCG TABS (LEVOTHYROXINE SODIUM) 1 qd  #90 Each x 1   Entered by:   Bill Salinas CMA   Authorized by:   Jacques Navy MD   Signed by:   Bill Salinas CMA on 08/25/2009   Method used:   Electronically to        [facility] [address] (retail)       [phone]       [place], [place]  [address]       Ph: [phone]       Fax: [phone]   RxID:   [id number]

## 2010-05-11 NOTE — Assessment & Plan Note (Signed)
Summary: foot pain/SD   Vital Signs:  Patient profile:   75 year old female Height:      63 inches Weight:      179 pounds BMI:     31.82 O2 Sat:      98 % on Room air Temp:     98.6 degrees F oral Pulse rate:   61 / minute BP sitting:   142 / 86  (left arm) Cuff size:   regular  Vitals Entered By: Bill Salinas CMA (April 27, 2010 4:41 PM)  O2 Flow:  Room air CC: pt here for evaluation  of left foot. Pt c/o knot with soreness and swelling x 1 month/ ab   Primary Care Provider:  Norins  CC:  pt here for evaluation  of left foot. Pt c/o knot with soreness and swelling x 1 month/ ab.  History of Present Illness: Patient presents for pain of the left foot for several weeks. There is slight redness at the 1st MTP joint. She has pain at the lateal foot where the is a knot. She does not recall any injury or strain. It does hurt to wear a shoe. Not a problem with walking. She has seen a podiartrist in the past about the great toe.   Current Medications (verified): 1)  Levothroid 50 Mcg Tabs (Levothyroxine Sodium) .Marland Kitchen.. 1 Qd 2)  Lovastatin 20 Mg Tabs (Lovastatin) .Marland Kitchen.. 1 Qd 3)  Vitamin D3 1000 Unit  Tabs (Cholecalciferol) .Marland Kitchen.. 1 Qd 4)  Vitamin B-12 1000 Mcg  Tabs (Cyanocobalamin) .Marland Kitchen.. 1 Qd 5)  Aleve 220 Mg  Tabs (Naproxen Sodium) .... As Needed 6)  Calcium 600 1500 Mg  Tabs (Calcium Carbonate) .... Take 1 Tablet By Mouth Two Times A Day 7)  Adult Aspirin Low Strength 81 Mg  Tbdp (Aspirin) .... Take 1 Tablet By Mouth Once A Day 8)  Alprazolam 0.25 Mg  Tabs (Alprazolam) .... As Directed As Needed 9)  Bystolic 10 Mg Tabs (Nebivolol Hcl) .Marland Kitchen.. 1 By Mouth Once Daily 10)  Furosemide 20 Mg Tabs (Furosemide) .Marland Kitchen.. 1po Once Daily 11)  Ranitidine Hcl 150 Mg Caps (Ranitidine Hcl) .Marland Kitchen.. 1 By Mouth Two Times A Day 12)  Neomycin-Polymyxin-Hc 3.5-10000-1 Soln (Neomycin-Polymyxin-Hc) .... 4 Qtts As Three Times A Day 13)  Tricor 48 Mg Tabs (Fenofibrate) .Marland Kitchen.. 1 By Mouth Q Pm 14)  Vitamin D  (Ergocalciferol) 50000 Unit Caps (Ergocalciferol) .Marland Kitchen.. 1 By Mouth Weekly X 8 Weeks  Allergies (verified): 1)  ! Codeine  Past History:  Past Medical History: Last updated: 10/03/2007 Goiter B12 def Vit D def TMJ right grief and loss related depression '08 back pain - chronic  Past Surgical History: Last updated: 03/25/2008 knee surgery-right arthroscopy '05  Dr. Charlann Boxer Tubal ligation Thyroidectomy-August '09 for goiter (Dr. Jearld Fenton) FH reviewed for relevance, SH/Risk Factors reviewed for relevance  Review of Systems  The patient denies fever, weight loss, chest pain, abdominal pain, muscle weakness, difficulty walking, and enlarged lymph nodes.    Physical Exam  General:  Well-developed,well-nourished,in no acute distress; alert,appropriate and cooperative throughout examination Lungs:  normal respiratory effort and normal breath sounds.   Heart:  normal rate and regular rhythm.   Msk:  slight valgus deformity 1st MTP left. Slight overlap great toe with 2nd toe. Hard nodule at the lateral aspect of the foot, not mobile, tender. Skin:  turgor normal and color normal.   Psych:  good eye contact and not anxious appearing.     Impression & Recommendations:  Problem #  1:  FOOT PAIN, LEFT (ICD-729.5) Early stage "bunion" left great toe. She has seen podiatry but is not interested in surgery. Now with a hard nodule lateral foot that may be a bone spur. the area is tender to the touch.  Plan - for continued pain recommend either a return to podiatry or to an orthopedist.  Complete Medication List: 1)  Levothroid 50 Mcg Tabs (Levothyroxine sodium) .Marland Kitchen.. 1 qd 2)  Lovastatin 20 Mg Tabs (Lovastatin) .Marland Kitchen.. 1 qd 3)  Vitamin D3 1000 Unit Tabs (Cholecalciferol) .Marland Kitchen.. 1 qd 4)  Vitamin B-12 1000 Mcg Tabs (Cyanocobalamin) .Marland Kitchen.. 1 qd 5)  Aleve 220 Mg Tabs (Naproxen sodium) .... As needed 6)  Calcium 600 1500 Mg Tabs (Calcium carbonate) .... Take 1 tablet by mouth two times a day 7)  Adult  Aspirin Low Strength 81 Mg Tbdp (Aspirin) .... Take 1 tablet by mouth once a day 8)  Alprazolam 0.25 Mg Tabs (Alprazolam) .... As directed as needed 9)  Bystolic 10 Mg Tabs (Nebivolol hcl) .Marland Kitchen.. 1 by mouth once daily 10)  Furosemide 20 Mg Tabs (Furosemide) .Marland Kitchen.. 1po once daily 11)  Ranitidine Hcl 150 Mg Caps (Ranitidine hcl) .Marland Kitchen.. 1 by mouth two times a day 12)  Neomycin-polymyxin-hc 3.5-10000-1 Soln (Neomycin-polymyxin-hc) .... 4 qtts as three times a day 13)  Tricor 48 Mg Tabs (Fenofibrate) .Marland Kitchen.. 1 by mouth q pm 14)  Vitamin D (ergocalciferol) 50000 Unit Caps (Ergocalciferol) .Marland Kitchen.. 1 by mouth weekly x 8 weeks   Orders Added: 1)  Est. Patient Level II [[address]]

## 2010-05-11 NOTE — Progress Notes (Signed)
Summary: REFERRAL  Phone Note Call from Patient   Summary of Call: Patient is requesting referral to ortho for her foot. [place] [facility] does not take [facility].  Initial call taken by: Lamar Sprinkles, CMA,  April 25, 2010 3:46 PM  Follow-up for Phone Call        records reviewed: no evaluation over the past several months for any foot problem. What is the nature of the problem? Would an OV be helpful before making a referral.  Follow-up by: Jacques Navy MD,  April 25, 2010 6:29 PM  Additional Follow-up for Phone Call Additional follow up Details #1::        left vm on pt's vm to call office w/details of referral needed Additional Follow-up by: Lamar Sprinkles, CMA,  April 26, 2010 3:40 PM

## 2010-08-22 NOTE — Op Note (Signed)
NAMEJULYANA, Kayla Craig NO.:  [id number]   MEDICAL RECORD NO.:  [id number]          PATIENT TYPE:  OIB   LOCATION:  5123                         FACILITY:  [facility]   PHYSICIAN:  Suzanna Obey, M.D.       DATE OF BIRTH:  07-Apr-1936   DATE OF PROCEDURE:  DATE OF DISCHARGE:                               OPERATIVE REPORT   PREOPERATIVE DIAGNOSIS:  Right thyroid mass.   POSTOPERATIVE DIAGNOSIS:  Right thyroid mass.   SURGICAL PROCEDURE:  Right thyroidectomy.   ANESTHESIA:  General.   ESTIMATED BLOOD LOSS:  Less than 20 mL.   INDICATIONS:  This is a 75 year old who has had a very large mass in her  right thyroid that has been needled, which showed some atypical cells  that were possibly consistent with papillary carcinoma.  The patient has  been informed of the issues, and she wants to have this mass removed.  She understands the risks and benefits as well as options.  All  questions were answered and consent was obtained.   OPERATION:  The patient was taken to the operating room and placed in  supine position.  After adequate general endotracheal tube anesthesia,  she was prepped and draped in usual sterile manner.  An incision was  made at the base of her neck right above the collar bone in a horizontal  fashion.  Dissection was carried with a inferior and superior flap  raise.  The midline was dissected.  There are large masses immediately  identified that was distorting the isthmus to the left.  This mass was  dissected along its capsule down to the lateral aspect where the thyroid  tissue was incorporated into this mass and dissected layer by layer  right along its capsule.  This was then removed and the nerve was  identified and preserved and the stimulator with the endotracheal tube  was used and on touching the nerve, there was stimulation of the vocal  cord.  A parathyroid was identified in the lateral tissue.  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  Given  this, the left side was left alone and it was palpable with no obvious  mass that could be felt.  The patient was then closed with drain #7 and  interrupted 4-0 chromic and then a Dermabond to close the skin.  This  drain was sutured and placed with a 5-0 nylon.  The patient was awakened  and brought to recovery room in stable condition.  Counts were correct.           ______________________________  Suzanna Obey, M.D.     JB/MEDQ  D:  11/13/2007  T:  11/13/2007  Job:  [id number]

## 2010-10-21 ENCOUNTER — Other Ambulatory Visit: Payer: Self-pay | Admitting: Internal Medicine

## 2010-10-22 ENCOUNTER — Other Ambulatory Visit: Payer: Self-pay | Admitting: Internal Medicine

## 2011-01-03 ENCOUNTER — Other Ambulatory Visit: Payer: Self-pay | Admitting: Internal Medicine

## 2011-01-03 DIAGNOSIS — Z1231 Encounter for screening mammogram for malignant neoplasm of breast: Secondary | ICD-10-CM

## 2011-01-05 LAB — BASIC METABOLIC PANEL
BUN: 16
CO2: 28
Calcium: 9.7
Creatinine, Ser: 0.93
GFR calc non Af Amer: 60 — ABNORMAL LOW
Glucose, Bld: 101 — ABNORMAL HIGH

## 2011-01-05 LAB — CBC
MCHC: 33.8
Platelets: 237
RDW: 14.4

## 2011-02-22 ENCOUNTER — Ambulatory Visit
Admission: RE | Admit: 2011-02-22 | Discharge: 2011-02-22 | Disposition: A | Discharge status: Home / Self Care | Payer: Medicare PPO | Source: Ambulatory Visit | Attending: Internal Medicine | Admitting: Internal Medicine

## 2011-02-22 DIAGNOSIS — Z1231 Encounter for screening mammogram for malignant neoplasm of breast: Secondary | ICD-10-CM

## 2011-02-26 ENCOUNTER — Other Ambulatory Visit: Payer: Self-pay | Admitting: Internal Medicine

## 2011-03-07 ENCOUNTER — Other Ambulatory Visit: Payer: Self-pay | Admitting: Internal Medicine

## 2011-05-22 ENCOUNTER — Other Ambulatory Visit: Payer: Self-pay | Admitting: Internal Medicine

## 2011-12-19 ENCOUNTER — Other Ambulatory Visit: Payer: Self-pay | Admitting: *Deleted

## 2011-12-19 MED ORDER — FUROSEMIDE 20 MG PO TABS: 20.0000 mg | ORAL_TABLET | Freq: Every day | ORAL | Status: DC

## 2011-12-19 NOTE — Telephone Encounter (Signed)
REFILL LASIX TO PHARMACY. PT. NEEDS APPT. FOR FURTHER REFILLS

## 2012-02-19 ENCOUNTER — Other Ambulatory Visit: Payer: Self-pay | Admitting: *Deleted

## 2012-02-19 ENCOUNTER — Other Ambulatory Visit: Payer: Self-pay | Admitting: Internal Medicine

## 2012-02-19 DIAGNOSIS — Z1231 Encounter for screening mammogram for malignant neoplasm of breast: Secondary | ICD-10-CM

## 2012-02-19 MED ORDER — LEVOTHYROXINE SODIUM 50 MCG PO TABS: ORAL_TABLET | ORAL | Status: DC

## 2012-02-19 NOTE — Telephone Encounter (Signed)
R'cd fax from [facility] pharmacy for refill of Synthroid.

## 2012-03-17 ENCOUNTER — Encounter: Payer: Self-pay | Admitting: Internal Medicine

## 2012-03-17 ENCOUNTER — Ambulatory Visit (INDEPENDENT_AMBULATORY_CARE_PROVIDER_SITE_OTHER): Payer: Medicare Other | Admitting: Internal Medicine

## 2012-03-17 ENCOUNTER — Telehealth: Payer: Self-pay | Admitting: Internal Medicine

## 2012-03-17 VITALS — BP 162/102 | HR 67 | Temp 98.4°F | Ht 63.0 in | Wt 176.0 lb

## 2012-03-17 DIAGNOSIS — I10 Essential (primary) hypertension: Secondary | ICD-10-CM

## 2012-03-17 MED ORDER — LISINOPRIL 20 MG PO TABS: 20.0000 mg | ORAL_TABLET | Freq: Every day | ORAL | Status: DC

## 2012-03-17 MED ORDER — NEBIVOLOL HCL 10 MG PO TABS: 10.0000 mg | ORAL_TABLET | Freq: Every day | ORAL | Status: DC

## 2012-03-17 MED ORDER — FUROSEMIDE 20 MG PO TABS: 20.0000 mg | ORAL_TABLET | Freq: Every day | ORAL | Status: DC

## 2012-03-17 NOTE — Telephone Encounter (Signed)
Patient Information:  Caller Name: Brannon  Phone: [phone]  Patient: Kayla Craig, Kayla Craig  Gender: Female  DOB: [birth year]  Age: 76 Years  PCP: Illene Regulus (Adults only)   Symptoms  Reason For Call & Symptoms: Called regarding hypertension.  BP taken by facility nurse was 174/100 at 1030 and headache present with puffy eyes.  Reviewed Health History In EMR: Yes  Reviewed Medications In EMR: Yes  Reviewed Allergies In EMR: Yes  Reviewed Surgeries / Procedures: Yes  Date of Onset of Symptoms: 03/17/2012  Guideline(s) Used:  High Blood Pressure  Disposition Per Guideline:   Discuss with PCP and Callback by Nurse Today  Reason For Disposition Reached:   Taking BP medications and feels is having side effects (e.g., impotence, cough, dizziness)  Advice Given:  General:  Untreated high blood pressure may cause damage to the heart, brain, kidneys, and eyes.  Treatment of high blood pressure can reduce the risk of stroke, heart attack, and heart failure.  The goal of blood pressure treatment for most patients with hypertension is to keep the blood pressure under 140/90.  Lifestyle Changes  Maintain a healthy weight. Lose weight if you are overweight.  Eat a diet high in fresh fruits and low-fat dairy products. Limit your intake of saturated and total fat. Choose foods that are lower in salt.  Office Follow Up:  Does the office need to follow up with this patient?: No  Instructions For The Office: N/A  RN Overrode Recommendation:  Make Appointment  RN judgement; advised to see MD within 24 hours  Appointment Scheduled:  03/17/2012 15:45:00 Appointment Scheduled Provider:  Nicki Reaper  RN Note: Mild right-sided headache present. Will run out of Lasix and antihypertensive medication at end of December.  Unable to get physical appointment until 05/09/11. Coughs "all the time" for past 6 months but feels it may be related to smokers in adjacent appartments.  Stopped taking  Tricor due to nightmares and leg cramping. RN advised appointment within 24 hours due to nursing judgement.

## 2012-03-17 NOTE — Patient Instructions (Addendum)

## 2012-03-17 NOTE — Progress Notes (Signed)
Subjective:    Patient ID: Kayla Craig, female    DOB: 01/06/1936, 76 y.o.   MRN: [id number]  HPI  Pt presents to the clinic today for evaluation of high blood pressure as well as a headache. She said that the nurse at the facility she lives at checked her blood pressure today and it was 172/100. She stated that her blood pressure has been high over the last year but she has not been to the doctor about it. She has had frequent headaches over the last 2 months as well associated with the high blood pressure. She is taking the lasix and bystolic as prescribed. She does not check her blood pressure at home. She only has it checked once a month by the nurse at the facility. She does not add salt to her food.  Review of Systems      Past Medical History  Diagnosis Date  . Goiter   . B12 deficiency   . Vitamin D deficiency   . TMJ (dislocation of temporomandibular joint)     Right Side  . 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 Back pain     Chronic    Current Outpatient Prescriptions  Medication Sig Dispense Refill  . aspirin 81 MG EC tablet Take 81 mg by mouth daily.        . Calcium Carbonate-Vitamin D (CALCIUM 600 + D PO) Take 1 tablet by mouth 2 (two) times daily.      . Cholecalciferol (VITAMIN D-3) 1000 UNITS CAPS Take 1 capsule by mouth daily.      . furosemide (LASIX) 20 MG tablet Take 1 tablet (20 mg total) by mouth daily.  30 tablet  0  . levothyroxine (SYNTHROID, LEVOTHROID) 50 MCG tablet TAKE ONE TABLET BY MOUTH EVERY DAY  30 tablet  1  . nebivolol (BYSTOLIC) 10 MG tablet Take 1 tablet (10 mg total) by mouth daily.  30 tablet  0  . ranitidine (ZANTAC) 150 MG tablet Take 150 mg by mouth 2 (two) times daily.        . TRICOR 48 MG tablet TAKE ONE TABLET BY MOUTH IN THE EVENING  30 each  5  . vitamin B-12 (CYANOCOBALAMIN) 1000 MCG tablet Take 1,000 mcg by mouth daily.        . [DISCONTINUED] BYSTOLIC 10 MG tablet TAKE ONE TABLET BY MOUTH EVERY DAY  30 each  11   . [DISCONTINUED] furosemide (LASIX) 20 MG tablet Take 1 tablet (20 mg total) by mouth daily.  30 tablet  1  . lisinopril (PRINIVIL,ZESTRIL) 20 MG tablet Take 1 tablet (20 mg total) by mouth daily.  90 tablet  3    Allergies  Allergen Reactions  . Codeine     History reviewed. No pertinent family history.  History   Social History  . Marital Status: Single    Spouse Name: N/A    Number of Children: N/A  . Years of Education: N/A   Occupational History  . Not on file.   Social History Main Topics  . Smoking status: Never Smoker   . Smokeless tobacco: Not on file  . Alcohol Use: No  . Drug Use: Not on file  . Sexually Active: Not on file   Other Topics Concern  . 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 Laymen's guide packet provided (july '11)     Constitutional: Pt reports headache. Denies fever, malaise, fatigue or abrupt weight changes.  HEENT: Denies eye pain, eye redness, ear pain, ringing in the ears, wax buildup, runny nose, nasal congestion, bloody nose, or sore throat. Respiratory: Denies difficulty breathing, shortness of breath, cough or sputum production.   Cardiovascular: Denies chest pain, chest tightness, palpitations or swelling in the hands or feet.  Neurological: Denies dizziness, difficulty with memory, difficulty with speech or problems with balance and coordination.   No other specific complaints in a complete review of systems (except as listed in HPI above).  Objective:   Physical Exam   BP 162/102  Pulse 67  Temp 98.4 F (36.9 C) (Oral)  Ht 5\' 3"  (1.6 m)  Wt 176 lb (79.833 kg)  BMI 31.18 kg/m2  SpO2 97% Wt Readings from Last 3 Encounters:   03/17/12 176 lb (79.833 kg)  04/27/10 179 lb (81.194 kg)  02/20/10 177 lb (80.287 kg)    General: Appears her stated age, well developed, well nourished in NAD.  Cardiovascular: Normal rate and rhythm. S1,S2 noted.  No murmur, rubs or gallops noted. No JVD or BLE edema. No carotid bruits noted. Pulmonary/Chest: Normal effort and positive vesicular breath sounds. No respiratory distress. No wheezes, rales or ronchi noted.  Neurological: Alert and oriented. Cranial nerves II-XII intact. Coordination normal. +DTRs bilaterally. Psychiatric: Mood and affect normal. Behavior is normal. Judgment and thought content normal.   EKG:  BMET    Component Value Date/Time   NA 139 10/20/2009 1559   K 4.0 10/20/2009 1559   CL 102 10/20/2009 1559   CO2 27 10/20/2009 1559   GLUCOSE 95 10/20/2009 1559   BUN 25* 10/20/2009 1559   CREATININE 0.9 10/20/2009 1559   CALCIUM 9.1 10/20/2009 1559   GFRNONAA 63.78 10/20/2009 1559   GFRAA 91 03/25/2008 1217    Lipid Panel     Component Value Date/Time   CHOL 294* 10/20/2009 1606   TRIG 1371.0* 10/20/2009 1606   HDL 40.90 10/20/2009 1606   CHOLHDL 7 10/20/2009 1606   VLDL 274.2* 10/20/2009 1606    CBC    Component Value Date/Time   WBC 5.8 11/07/2007 1003   RBC 4.55 11/07/2007 1003   HGB 13.2 11/07/2007 1003   HCT 39.1 11/07/2007 1003   PLT 237 11/07/2007 1003   MCV 85.9 11/07/2007 1003   MCHC 33.8 11/07/2007 1003   RDW 14.4 11/07/2007 1003    Hgb A1C No results found for this basename: HGBA1C        Assessment & Plan:   Hypertension, uncontrolled on dual therapy:  Continue current medications. Refilled x 1 month until you follow up with Dr. Debby Bud Start taking Lisinopril 20 mg daily Have nurse at your facility monitor blood pressures daily and keep a record to bring back to your follow up.  RTC i n 1 month for yearly physical and  F/u of blood pressure

## 2012-03-27 ENCOUNTER — Ambulatory Visit
Admission: RE | Admit: 2012-03-27 | Discharge: 2012-03-27 | Disposition: A | Discharge status: Home / Self Care | Payer: Medicare Other | Source: Ambulatory Visit | Attending: Internal Medicine | Admitting: Internal Medicine

## 2012-03-27 DIAGNOSIS — Z1231 Encounter for screening mammogram for malignant neoplasm of breast: Secondary | ICD-10-CM

## 2012-04-21 ENCOUNTER — Other Ambulatory Visit: Payer: Self-pay | Admitting: *Deleted

## 2012-04-21 ENCOUNTER — Other Ambulatory Visit: Payer: Self-pay | Admitting: Internal Medicine

## 2012-04-21 DIAGNOSIS — I10 Essential (primary) hypertension: Secondary | ICD-10-CM

## 2012-04-21 MED ORDER — LISINOPRIL 20 MG PO TABS: 20.0000 mg | ORAL_TABLET | Freq: Every day | ORAL | Status: DC

## 2012-04-21 MED ORDER — NEBIVOLOL HCL 10 MG PO TABS: 10.0000 mg | ORAL_TABLET | Freq: Every day | ORAL | Status: DC

## 2012-04-28 ENCOUNTER — Other Ambulatory Visit: Payer: Self-pay | Admitting: *Deleted

## 2012-04-28 MED ORDER — FUROSEMIDE 20 MG PO TABS: 20.0000 mg | ORAL_TABLET | Freq: Every day | ORAL | Status: DC

## 2012-04-28 NOTE — Telephone Encounter (Signed)
R'cd fax from [facility] for refill of Lasix.

## 2012-05-08 ENCOUNTER — Encounter: Payer: Self-pay | Admitting: Internal Medicine

## 2012-05-08 ENCOUNTER — Other Ambulatory Visit (INDEPENDENT_AMBULATORY_CARE_PROVIDER_SITE_OTHER): Payer: Medicare Other

## 2012-05-08 ENCOUNTER — Ambulatory Visit (INDEPENDENT_AMBULATORY_CARE_PROVIDER_SITE_OTHER): Payer: Medicare Other | Admitting: Internal Medicine

## 2012-05-08 VITALS — BP 148/90 | HR 65 | Temp 98.5°F | Resp 10 | Ht 65.0 in | Wt 178.2 lb

## 2012-05-08 DIAGNOSIS — E538 Deficiency of other specified B group vitamins: Secondary | ICD-10-CM

## 2012-05-08 DIAGNOSIS — Z0000 Encounter for general adult medical examination without abnormal findings: Secondary | ICD-10-CM

## 2012-05-08 DIAGNOSIS — I10 Essential (primary) hypertension: Secondary | ICD-10-CM

## 2012-05-08 DIAGNOSIS — E785 Hyperlipidemia, unspecified: Secondary | ICD-10-CM

## 2012-05-08 DIAGNOSIS — E039 Hypothyroidism, unspecified: Secondary | ICD-10-CM

## 2012-05-08 DIAGNOSIS — Z23 Encounter for immunization: Secondary | ICD-10-CM

## 2012-05-08 DIAGNOSIS — E042 Nontoxic multinodular goiter: Secondary | ICD-10-CM

## 2012-05-08 LAB — HEPATIC FUNCTION PANEL
ALT: 23 U/L (ref 0–35)
AST: 18 U/L (ref 0–37)
Alkaline Phosphatase: 45 U/L (ref 39–117)
Bilirubin, Direct: 0 mg/dL (ref 0.0–0.3)
Total Bilirubin: 0.7 mg/dL (ref 0.3–1.2)

## 2012-05-08 LAB — LIPID PANEL
Cholesterol: 291 mg/dL — ABNORMAL HIGH (ref 0–200)
HDL: 37.2 mg/dL — ABNORMAL LOW (ref >39.00)
Total CHOL/HDL Ratio: 8
Triglycerides: 888 mg/dL — ABNORMAL HIGH (ref 0.0–149.0)
VLDL: 177.6 mg/dL — ABNORMAL HIGH (ref 0.0–40.0)

## 2012-05-08 LAB — COMPREHENSIVE METABOLIC PANEL
ALT: 23 U/L (ref 0–35)
AST: 18 U/L (ref 0–37)
Alkaline Phosphatase: 45 U/L (ref 39–117)
BUN: 19 mg/dL (ref 6–23)
Creatinine, Ser: 0.8 mg/dL (ref 0.4–1.2)
Total Bilirubin: 0.7 mg/dL (ref 0.3–1.2)

## 2012-05-08 NOTE — Patient Instructions (Addendum)
Thanks for coming to see me.  Your exam is good. Will get full labs today and the results will be in the report I send you.  Immunizations today: Tetanus and pneumonia vaccine.  Call Dr. Randa Evens office to schedule your colonoscopy.  Come back in 1 year or sooner as needed.

## 2012-05-08 NOTE — Progress Notes (Signed)
Subjective:    Patient ID: Kayla Craig, female    DOB: 08/18/1935, 77 y.o.   MRN: [id number]  HPI The patient is here for annual Medicare wellness examination and management of other chronic and acute problems. Last physical with labs in '11. She was recently seen by Ms. Baity for hypertension and had lisinopril added to her regimen which she has tolerated well. She did stop takingTricor in June due to leg pain and bad dreams. She has otherwise been doing OK w/o major illness, surgery, or injury.   The risk factors are reflected in the social history.  The roster of all physicians providing medical care to patient - is listed in the Snapshot section of the chart.  Activities of daily living:  The patient is 100% inedpendent in all ADLs: dressing, toileting, feeding as well as independent mobility  Home safety : The patient has smoke detectors in the home. Falls - fell once in August, tripped on an escalator. They wear seatbelts. No firearms at home  There is no risks for hepatitis, STDs or HIV. There is no   history of blood transfusion. They have no travel history to infectious disease endemic areas of the world.  The patient has seen their dentist in the last six month. They have seen their eye doctor in the last year. They deny any hearing difficulty and have not had audiologic testing in the last year.    They do not  have excessive sun exposure. Discussed the need for sun protection: hats, long sleeves and use of sunscreen if there is significant sun exposure.   Diet: the importance of a healthy diet is discussed. They do have a healthy  diet.  The patient has no regular exercise program.  The benefits of regular aerobic exercise were discussed.  Depression screen: there are no signs or vegative symptoms of depression- irritability, change in appetite, anhedonia, sadness/tearfullness.  Cognitive assessment: the patient manages all their financial and personal affairs and is  actively engaged.  The following portions of the patient's history were reviewed and updated as appropriate: allergies, current medications, past family history, past medical history,  past surgical history, past social history  and problem list.  Past Medical History  Diagnosis Date  . Goiter   . B12 deficiency   . Vitamin D deficiency   . TMJ (dislocation of temporomandibular joint)     Right Side  . 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 Back pain     Chronic   Past Surgical History  Procedure Date  . Knee surgery     Right Arthroscopy '05 Dr Charlann Boxer  . Tubal ligation   . Thyroidectomy     August '09 for goiter (Dr Jearld Fenton)  . Eye surgery 2010    cataract surgery with implants   Family History  Problem Relation Age of Onset  . Cancer Mother   . Heart disease Father     CAD/MI  . Heart disease Brother     CAD/MI  . Diabetes Neg Hx   . Heart disease Brother     CAD/MI  . Cancer Brother    History   Social History  . Marital Status: Single    Spouse Name: N/A    Number of Children: 2  . Years of Education: 12   Occupational History  . retired    Social History Main Topics  . Smoking status: Never Smoker   . Smokeless tobacco: Never Used  .  Alcohol Use: No  . Drug Use: Not on file  . Sexually Active: No   Other Topics Concern  . Not on file   Social History Narrative   HSG. [place]. 2 sons- 09/04/2060 (died lung cancer 09/01/22), Sep 05, 2067; 3 grandchildren. 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 End of life issues: patient would want CPR, DNI, not to be maintained in a vegative state. Laymen's guide packet provided (july 2022-09-05)    Current Outpatient Prescriptions on File Prior to Visit  Medication Sig Dispense Refill  . aspirin 81 MG EC tablet Take 81 mg by mouth daily.        . Cholecalciferol (VITAMIN D-3) 1000 UNITS CAPS Take 1 capsule by mouth daily.      .  furosemide (LASIX) 20 MG tablet Take 1 tablet (20 mg total) by mouth daily.  30 tablet  0  . levothyroxine (SYNTHROID, LEVOTHROID) 50 MCG tablet TAKE ONE TABLET BY MOUTH EVERY DAY  30 tablet  2  . lisinopril (PRINIVIL,ZESTRIL) 20 MG tablet Take 1 tablet (20 mg total) by mouth daily.  30 tablet  0  . nebivolol (BYSTOLIC) 10 MG tablet Take 1 tablet (10 mg total) by mouth daily.  30 tablet  0  . ranitidine (ZANTAC) 150 MG tablet Take 150 mg by mouth 2 (two) times daily.        . vitamin B-12 (CYANOCOBALAMIN) 1000 MCG tablet Take 1,000 mcg by mouth daily.           Vision, hearing, body mass index were assessed and reviewed.   During the course of the visit the patient was educated and counseled about appropriate screening and preventive services including : fall prevention , diabetes screening, nutrition counseling, colorectal cancer screening, and recommended immunizations.    Review of Systems Constitutional:  Negative for fever, chills, activity change and unexpected weight change.  HEENT:  Negative for hearing loss, ear pain, congestion, neck stiffness and postnasal drip. Negative for sore throat or swallowing problems. Negative for dental complaints.   Eyes: Negative for vision loss or change in visual acuity.  Respiratory: Negative for chest tightness and wheezing. Negative for DOE.   Cardiovascular: Negative for chest pain or palpitations. No decreased exercise tolerance Gastrointestinal: No change in bowel habit. No bloating or gas. No reflux or indigestion Genitourinary: Negative for urgency, frequency, flank pain and difficulty urinating.  Musculoskeletal: Negative for myalgias, back pain, arthralgias and gait problem.  Neurological: Negative for dizziness, tremors, weakness and headaches.  Hematological: Negative for adenopathy.  Psychiatric/Behavioral: Negative for behavioral problems and dysphoric mood.       Objective:   Physical Exam Filed Vitals:   05/08/12 1333  BP:  148/90  Pulse: 65  Temp: 98.5 F (36.9 C)  Resp: 10   Wt Readings from Last 3 Encounters:  05/08/12 178 lb 3.2 oz (80.831 kg)  03/17/12 176 lb (79.833 kg)  04/27/10 179 lb (81.194 kg)   Gen'l: well nourished, well developed white Woman in no distress HEENT - Elfrida/AT, EACs/TMs normal, oropharynx with native dentition in good condition, no buccal or palatal lesions, posterior pharynx clear, mucous membranes moist. C&S clear, PERRLA, fundi - normal Neck - supple, no thyromegaly Nodes- negative submental, cervical, supraclavicular regions Chest - no deformity, no CVAT Lungs - clear without rales, wheezes. No increased work of breathing Breast - - Skin normal, nipples w/o discharge, no fixed mass or lesion, no axillary adenopathy. Cardiovascular - regular  rate and rhythm, quiet precordium, no murmurs, rubs or gallops, 2+ radial, DP and PT pulses Abdomen - BS+ x 4, no HSM, no guarding or rebound or tenderness Pelvic - deferred to age Rectal - deferred to GI Extremities - no clubbing, cyanosis, edema or deformity.  Neuro - A&O x 3, CN II-XII normal, motor strength normal and equal, DTRs 2+ and symmetrical biceps, radial, and patellar tendons. Cerebellar - no tremor, no rigidity, fluid movement and normal gait. Derm - Head, neck, back, abdomen and extremities without suspicious lesions  Lab Results  Component Value Date   WBC 5.8 11/07/2007   HGB 13.2 11/07/2007   HCT 39.1 11/07/2007   PLT 237 11/07/2007   GLUCOSE 91 05/08/2012   CHOL 291* 05/08/2012   TRIG 888.0 Triglyceride is over 400; calculations on Lipids are invalid.* 05/08/2012   HDL 37.20* 05/08/2012   LDLDIRECT 93.0 05/08/2012        ALT 23 05/08/2012   AST 18 05/08/2012        NA 136 05/08/2012   K 4.3 05/08/2012   CL 101 05/08/2012   CREATININE 0.8 05/08/2012   BUN 19 05/08/2012   CO2 27 05/08/2012   TSH 1.90 05/08/2012   HGBA1C 6.1 05/08/2012          Assessment & Plan:

## 2012-05-10 DIAGNOSIS — Z0000 Encounter for general adult medical examination without abnormal findings: Secondary | ICD-10-CM | POA: Insufficient documentation

## 2012-05-10 MED ORDER — OMEGA-3-ACID ETHYL ESTERS 1 G PO CAPS: 2.0000 g | ORAL_CAPSULE | Freq: Two times a day (BID) | ORAL | Status: DC

## 2012-05-10 NOTE — Assessment & Plan Note (Signed)
Lab Results  Component Value Date   VITAMINB12 249 05/08/2012   Low normal range.

## 2012-05-10 NOTE — Assessment & Plan Note (Signed)
Intolerant of Tricor and she stopped. F/u Triglyerides - 888.  Plan -  Trial of high dose fish oil, e.g. Lovasa (Rx to pharmacy) 2 g bid.  Follow up lipid panel 6-8 weeks

## 2012-05-10 NOTE — Assessment & Plan Note (Signed)
BP Readings from Last 3 Encounters:  05/08/12 148/90  03/17/12 162/102  04/27/10 142/86   Borderline control. Will continue triple drug therapy

## 2012-05-10 NOTE — Assessment & Plan Note (Signed)
Lab Results  Component Value Date   TSH 1.90 05/08/2012   Good control on present medication

## 2012-05-10 NOTE — Assessment & Plan Note (Signed)
Interval history is unremarkable. Physical exam is normal. Labs reviewed and OK except for elevated TC and triglycerides. Last colonoscopy '04 - due for routine follow up. Immunizations brought up todate today but she will need to checkon coverage for shingles vaccine. Current with mammography.  In summary -a nice woman who appears to be medically stable and up to date on health maintenance. She will return in 1 year, sooner as needed.

## 2012-05-10 NOTE — Assessment & Plan Note (Signed)
Lab Results  Component Value Date   TSH 1.90 05/08/2012   Good thyroid function. No progressive enlargement of neck.

## 2012-05-13 ENCOUNTER — Telehealth: Payer: Self-pay | Admitting: Internal Medicine

## 2012-05-13 NOTE — Telephone Encounter (Signed)
Patient can not afford the medication that was called in for her cholesterol (lovaza) and she would like a generic medication sent in to [facility] [address]

## 2012-05-13 NOTE — Telephone Encounter (Signed)
Please read note below and advise.  

## 2012-05-14 ENCOUNTER — Telehealth: Payer: Self-pay | Admitting: *Deleted

## 2012-05-14 NOTE — Telephone Encounter (Signed)
Called pt and left detailed vm that she can take OTC, generic fish oil capsules 3 g daily. If she has any questions she can call us back.

## 2012-05-14 NOTE — Telephone Encounter (Signed)
lovaza was on her med list at her visit. She may take generic fish oil capsules 3 g daily

## 2012-05-19 ENCOUNTER — Other Ambulatory Visit: Payer: Self-pay | Admitting: *Deleted

## 2012-05-19 NOTE — Telephone Encounter (Signed)
Opened in error

## 2012-05-30 ENCOUNTER — Other Ambulatory Visit: Payer: Self-pay | Admitting: Internal Medicine

## 2012-06-06 ENCOUNTER — Other Ambulatory Visit: Payer: Self-pay | Admitting: Internal Medicine

## 2012-06-27 ENCOUNTER — Other Ambulatory Visit: Payer: Self-pay | Admitting: Internal Medicine

## 2012-07-17 ENCOUNTER — Other Ambulatory Visit: Payer: Self-pay | Admitting: Internal Medicine

## 2012-12-26 ENCOUNTER — Encounter (HOSPITAL_COMMUNITY): Payer: Self-pay

## 2012-12-26 ENCOUNTER — Inpatient Hospital Stay (HOSPITAL_COMMUNITY)
Admission: EM | Admit: 2012-12-26 | Discharge: 2012-12-27 | DRG: 392 | Disposition: A | Discharge status: Home / Self Care | Payer: Medicare Other | Attending: Internal Medicine | Admitting: Internal Medicine

## 2012-12-26 DIAGNOSIS — E039 Hypothyroidism, unspecified: Secondary | ICD-10-CM | POA: Diagnosis present

## 2012-12-26 DIAGNOSIS — E1169 Type 2 diabetes mellitus with other specified complication: Secondary | ICD-10-CM | POA: Diagnosis present

## 2012-12-26 DIAGNOSIS — R229 Localized swelling, mass and lump, unspecified: Secondary | ICD-10-CM

## 2012-12-26 DIAGNOSIS — R1110 Vomiting, unspecified: Secondary | ICD-10-CM

## 2012-12-26 DIAGNOSIS — F4321 Adjustment disorder with depressed mood: Secondary | ICD-10-CM

## 2012-12-26 DIAGNOSIS — Z0000 Encounter for general adult medical examination without abnormal findings: Secondary | ICD-10-CM

## 2012-12-26 DIAGNOSIS — M26629 Arthralgia of temporomandibular joint, unspecified side: Secondary | ICD-10-CM

## 2012-12-26 DIAGNOSIS — K219 Gastro-esophageal reflux disease without esophagitis: Secondary | ICD-10-CM | POA: Diagnosis present

## 2012-12-26 DIAGNOSIS — K529 Noninfective gastroenteritis and colitis, unspecified: Secondary | ICD-10-CM | POA: Diagnosis present

## 2012-12-26 DIAGNOSIS — E1159 Type 2 diabetes mellitus with other circulatory complications: Secondary | ICD-10-CM | POA: Diagnosis present

## 2012-12-26 DIAGNOSIS — K5289 Other specified noninfective gastroenteritis and colitis: Principal | ICD-10-CM | POA: Diagnosis present

## 2012-12-26 DIAGNOSIS — E559 Vitamin D deficiency, unspecified: Secondary | ICD-10-CM

## 2012-12-26 DIAGNOSIS — R531 Weakness: Secondary | ICD-10-CM

## 2012-12-26 DIAGNOSIS — M549 Dorsalgia, unspecified: Secondary | ICD-10-CM

## 2012-12-26 DIAGNOSIS — Z79899 Other long term (current) drug therapy: Secondary | ICD-10-CM

## 2012-12-26 DIAGNOSIS — R197 Diarrhea, unspecified: Secondary | ICD-10-CM

## 2012-12-26 DIAGNOSIS — E042 Nontoxic multinodular goiter: Secondary | ICD-10-CM

## 2012-12-26 DIAGNOSIS — E538 Deficiency of other specified B group vitamins: Secondary | ICD-10-CM

## 2012-12-26 DIAGNOSIS — E860 Dehydration: Secondary | ICD-10-CM | POA: Diagnosis present

## 2012-12-26 DIAGNOSIS — E781 Pure hyperglyceridemia: Secondary | ICD-10-CM

## 2012-12-26 DIAGNOSIS — I10 Essential (primary) hypertension: Secondary | ICD-10-CM | POA: Diagnosis present

## 2012-12-26 HISTORY — DX: Essential (primary) hypertension: I10

## 2012-12-26 LAB — POCT I-STAT TROPONIN I: Troponin i, poc: 0 ng/mL (ref 0.00–0.08)

## 2012-12-26 LAB — COMPREHENSIVE METABOLIC PANEL
AST: 20 U/L (ref 0–37)
Albumin: 3.7 g/dL (ref 3.5–5.2)
Alkaline Phosphatase: 46 U/L (ref 39–117)
BUN: 21 mg/dL (ref 6–23)
Calcium: 9.3 mg/dL (ref 8.4–10.5)
Chloride: 98 mEq/L (ref 96–112)
Creatinine, Ser: 0.8 mg/dL (ref 0.50–1.10)
Potassium: 3.6 mEq/L (ref 3.5–5.1)
Total Bilirubin: 0.4 mg/dL (ref 0.3–1.2)
Total Protein: 7.3 g/dL (ref 6.0–8.3)

## 2012-12-26 LAB — CBC WITH DIFFERENTIAL/PLATELET
Basophils Absolute: 0 10*3/uL (ref 0.0–0.1)
Basophils Relative: 0 % (ref 0–1)
Eosinophils Absolute: 0 10*3/uL (ref 0.0–0.7)
MCH: 28.8 pg (ref 26.0–34.0)
MCHC: 33.7 g/dL (ref 30.0–36.0)
Monocytes Absolute: 0.5 10*3/uL (ref 0.1–1.0)
Neutro Abs: 7.6 10*3/uL (ref 1.7–7.7)
Neutrophils Relative %: 82 % — ABNORMAL HIGH (ref 43–77)
Platelets: 211 10*3/uL (ref 150–400)
RDW: 13.8 % (ref 11.5–15.5)

## 2012-12-26 LAB — LIPASE, BLOOD: Lipase: 41 U/L (ref 11–59)

## 2012-12-26 MED ORDER — METOCLOPRAMIDE HCL 5 MG/ML IJ SOLN
10.0000 mg | Freq: Once | INTRAMUSCULAR | Status: AC
  Administered 2012-12-26: 10 mg via INTRAVENOUS
  Filled 2012-12-26: qty 2

## 2012-12-26 MED ORDER — FAMOTIDINE IN NACL 20-0.9 MG/50ML-% IV SOLN
20.0000 mg | Freq: Once | INTRAVENOUS | Status: AC
  Administered 2012-12-26: 20 mg via INTRAVENOUS
  Filled 2012-12-26: qty 50

## 2012-12-26 MED ORDER — SODIUM CHLORIDE 0.9 % IV BOLUS (SEPSIS)
1000.0000 mL | Freq: Once | INTRAVENOUS | Status: AC
  Administered 2012-12-26: 1000 mL via INTRAVENOUS

## 2012-12-26 NOTE — ED Notes (Signed)
Pt notified that urine sample is needed 

## 2012-12-26 NOTE — ED Provider Notes (Signed)
CSN: [id number]     Arrival date & time 12/26/12  2135 History   First MD Initiated Contact with Patient 12/26/12 2159     Chief Complaint  Patient presents with  . Nausea  . Emesis  . Diarrhea  . Dizziness   (Consider location/radiation/quality/duration/timing/severity/associated sxs/prior Treatment) HPI This 77 year old female developed nausea after eating dinner tonight followed shortly thereafter by nonbloody vomiting and diarrhea without abdominal pain, she does feel a bit lightheaded but has no vertigo, she is no headache or altered mental status, she is no change in speech vision swallowing or understanding, she is no chest pain shortness of breath back pain or abdominal pain, she is no dysuria, she is no bloody vomiting or bloody stools, treatment prior to arrival consists of Zofran from EMS which is improved but not resolved her nausea, she does feel generally weak since she started having vomiting and diarrhea onset within the last hour prior to arrival without syncope. Past Medical History  Diagnosis Date  . Goiter   . B12 deficiency   . Vitamin D deficiency   . TMJ (dislocation of temporomandibular joint)     Right Side  . 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 Back pain     Chronic  . Hypertension    Past Surgical History  Procedure Laterality Date  . Knee surgery      Right Arthroscopy '05 Dr Charlann Boxer  . Tubal ligation    . Thyroidectomy      August '09 for goiter (Dr Jearld Fenton)  . Eye surgery  2010    cataract surgery with implants   Family History  Problem Relation Age of Onset  . Cancer Mother   . Heart disease Father     CAD/MI  . Heart disease Brother     CAD/MI  . Diabetes Neg Hx   . Heart disease Brother     CAD/MI  . Cancer Brother    History  Substance Use Topics  . Smoking status: Never Smoker   . Smokeless tobacco: Never Used  . Alcohol Use: No   OB History   Grav Para Term Preterm Abortions TAB SAB Ect Mult Living                  Review of Systems 10 Systems reviewed and are negative for acute change except as noted in the HPI. Allergies  Codeine  Home Medications   No current outpatient prescriptions on file. BP 150/59  Pulse 60  Temp(Src) 98.6 F (37 C) (Oral)  Resp 16  Ht 5\' 4"  (1.626 m)  Wt 183 lb 6.4 oz (83.19 kg)  BMI 31.47 kg/m2  SpO2 97% Physical Exam  Nursing note and vitals reviewed. Constitutional:  Awake, alert, nontoxic appearance with baseline speech for patient.  HENT:  Head: Atraumatic.  Mouth/Throat: No oropharyngeal exudate.  No nystagmus noted  Eyes: EOM are normal. Pupils are equal, round, and reactive to light. Right eye exhibits no discharge. Left eye exhibits no discharge.  Neck: Neck supple.  Cardiovascular: Normal rate and regular rhythm.   No murmur heard. Pulmonary/Chest: Effort normal and breath sounds normal. No stridor. No respiratory distress. She has no wheezes. She has no rales. She exhibits no tenderness.  Abdominal: Soft. Bowel sounds are normal. She exhibits no mass. There is no tenderness. There is no rebound.  Musculoskeletal: She exhibits no tenderness.  Baseline ROM, moves extremities with no obvious new focal weakness.  Lymphadenopathy:  She has no cervical adenopathy.  Neurological:  Awake, alert, cooperative and aware of situation; motor strength bilaterally; sensation normal to light touch bilaterally; peripheral visual fields full to confrontation; no facial asymmetry; tongue midline; major cranial nerves appear intact; no pronator drift, normal finger to nose bilaterally  Skin: No rash noted.  Psychiatric: She has a normal mood and affect.    ED Course  Procedures (including critical care time) ECG: Sinus rhythm, ventricular rate 61, first degree AV block, RSR prime, left ventricular hypertrophy, no acute ischemic changes noted, no comparison ECG available  Patient states nausea resolved and has not had any vomiting in ED or any vertigo at all,  however states she feels lightheaded and generally weak and I was able to sit her up in the bed, but she states she is too weak and still too lightheaded to try to walk and does not want discharge. [facility] paged. 0013 D/w [facility] for Obs; [facility] will see Pt in ED. 0025  Labs Review Labs Reviewed  CBC WITH DIFFERENTIAL - Abnormal; Notable for the following:    Neutrophils Relative % 82 (*)    All other components within normal limits  COMPREHENSIVE METABOLIC PANEL - Abnormal; Notable for the following:    Sodium 134 (*)    Glucose, Bld 172 (*)    GFR calc non Af Amer 70 (*)    GFR calc Af Amer 82 (*)    All other components within normal limits  URINALYSIS, ROUTINE W REFLEX MICROSCOPIC - Abnormal; Notable for the following:    Ketones, ur 15 (*)    Leukocytes, UA TRACE (*)    All other components within normal limits  COMPREHENSIVE METABOLIC PANEL - Abnormal; Notable for the following:    Glucose, Bld 115 (*)    Calcium 8.3 (*)    Albumin 3.0 (*)    GFR calc non Af Amer 83 (*)    All other components within normal limits  LIPASE, BLOOD  URINE MICROSCOPIC-ADD ON  POCT I-STAT TROPONIN I   Imaging Review No results found.  MDM   1. Vomiting and diarrhea   2. Dehydration   3. Weakness   4. Essential hypertension, benign   5. Gastroenteritis   6. HYPOTHYROIDISM, UNSPECIFIED   7. Hypertriglyceridemia without hypercholesterolemia   8. HYPERTENSION   9. GERD   10. GOITER, NONTOXIC MULTINODULAR   11. B12 DEFICIENCY   12. VITAMIN D DEFICIENCY   13. GRIEF REACTION   14. TMJ PAIN   15. BACK PAIN   16. LOCALIZED SUPERFICIAL SWELLING MASS OR LUMP   17. Routine health maintenance    The patient appears reasonably stabilized for admission considering the current resources, flow, and capabilities available in the ED at this time, and I doubt any other [facility] requiring further screening and/or treatment in the ED prior to admission.    Hurman Horn, MD 12/27/12 1320

## 2012-12-26 NOTE — ED Notes (Signed)
PER [facility] Pt presents with N/V/D 1 hour ago vomiting x 1 diarrhea at the same time.  Dizziness to follow Hx of the same.  #18 to Left hand 450 NS zofran 4 mg given IVP. Denies CP SOB 12 lead unremarkable

## 2012-12-26 NOTE — ED Notes (Signed)
Pt attempted to urinate - unable event thought she states that she feels the urge to do so.

## 2012-12-26 NOTE — ED Notes (Signed)
Bed: ZO10 Expected date:  Expected time:  Means of arrival:  Comments: EMS/77 yo female wit N/V-hx vertigo-Zofran per EMS

## 2012-12-27 DIAGNOSIS — K529 Noninfective gastroenteritis and colitis, unspecified: Secondary | ICD-10-CM | POA: Diagnosis present

## 2012-12-27 DIAGNOSIS — R197 Diarrhea, unspecified: Secondary | ICD-10-CM

## 2012-12-27 DIAGNOSIS — R1110 Vomiting, unspecified: Secondary | ICD-10-CM

## 2012-12-27 DIAGNOSIS — K5289 Other specified noninfective gastroenteritis and colitis: Principal | ICD-10-CM

## 2012-12-27 DIAGNOSIS — E860 Dehydration: Secondary | ICD-10-CM

## 2012-12-27 DIAGNOSIS — I10 Essential (primary) hypertension: Secondary | ICD-10-CM

## 2012-12-27 LAB — COMPREHENSIVE METABOLIC PANEL
ALT: 21 U/L (ref 0–35)
AST: 18 U/L (ref 0–37)
Albumin: 3 g/dL — ABNORMAL LOW (ref 3.5–5.2)
Alkaline Phosphatase: 39 U/L (ref 39–117)
BUN: 15 mg/dL (ref 6–23)
Chloride: 104 mEq/L (ref 96–112)
Creatinine, Ser: 0.68 mg/dL (ref 0.50–1.10)
Potassium: 3.9 mEq/L (ref 3.5–5.1)
Total Bilirubin: 0.3 mg/dL (ref 0.3–1.2)
Total Protein: 6.3 g/dL (ref 6.0–8.3)

## 2012-12-27 LAB — URINALYSIS, ROUTINE W REFLEX MICROSCOPIC
Glucose, UA: NEGATIVE mg/dL
Ketones, ur: 15 mg/dL — AB
Nitrite: NEGATIVE
Specific Gravity, Urine: 1.017 (ref 1.005–1.030)
Urobilinogen, UA: 0.2 mg/dL (ref 0.0–1.0)
pH: 7 (ref 5.0–8.0)

## 2012-12-27 LAB — URINE MICROSCOPIC-ADD ON

## 2012-12-27 MED ORDER — ACETAMINOPHEN 650 MG RE SUPP: 650.0000 mg | Freq: Four times a day (QID) | RECTAL | Status: DC | PRN

## 2012-12-27 MED ORDER — FAMOTIDINE IN NACL 20-0.9 MG/50ML-% IV SOLN
20.0000 mg | Freq: Two times a day (BID) | INTRAVENOUS | Status: DC
  Administered 2012-12-27 (×2): 20 mg via INTRAVENOUS
  Filled 2012-12-27 (×3): qty 50

## 2012-12-27 MED ORDER — ONDANSETRON HCL 4 MG PO TABS: 4.0000 mg | ORAL_TABLET | Freq: Four times a day (QID) | ORAL | Status: DC | PRN

## 2012-12-27 MED ORDER — ONDANSETRON HCL 4 MG/2ML IJ SOLN: 4.0000 mg | Freq: Four times a day (QID) | INTRAMUSCULAR | Status: DC | PRN

## 2012-12-27 MED ORDER — LEVOTHYROXINE SODIUM 50 MCG PO TABS
50.0000 ug | ORAL_TABLET | Freq: Every day | ORAL | Status: DC
  Administered 2012-12-27: 50 ug via ORAL
  Filled 2012-12-27 (×2): qty 1

## 2012-12-27 MED ORDER — SODIUM CHLORIDE 0.9 % IV SOLN: INTRAVENOUS | Status: DC

## 2012-12-27 MED ORDER — ACETAMINOPHEN 325 MG PO TABS
650.0000 mg | ORAL_TABLET | Freq: Four times a day (QID) | ORAL | Status: DC | PRN
  Administered 2012-12-27: 650 mg via ORAL
  Filled 2012-12-27: qty 2

## 2012-12-27 MED ORDER — ENOXAPARIN SODIUM 40 MG/0.4ML ~~LOC~~ SOLN
40.0000 mg | SUBCUTANEOUS | Status: DC
  Administered 2012-12-27: 40 mg via SUBCUTANEOUS
  Filled 2012-12-27: qty 0.4

## 2012-12-27 MED ORDER — NEBIVOLOL HCL 10 MG PO TABS
10.0000 mg | ORAL_TABLET | Freq: Every day | ORAL | Status: DC
  Administered 2012-12-27: 10 mg via ORAL
  Filled 2012-12-27: qty 1

## 2012-12-27 MED ORDER — SODIUM CHLORIDE 0.9 % IV BOLUS (SEPSIS)
1000.0000 mL | Freq: Once | INTRAVENOUS | Status: AC
  Administered 2012-12-27: 1000 mL via INTRAVENOUS

## 2012-12-27 MED ORDER — SODIUM CHLORIDE 0.9 % IV SOLN
INTRAVENOUS | Status: DC
  Administered 2012-12-27: 02:00:00 via INTRAVENOUS

## 2012-12-27 NOTE — Progress Notes (Signed)
Subjective: Kayla Craig was admitted with N/V - question of food poisoning vs gastroenteritis. Feeling better. Able to eat and keep down breakfast.  Objective: Lab:  Recent Labs  12/26/12 2236  WBC 9.3  NEUTROABS 7.6  HGB 13.6  HCT 40.3  MCV 85.4  PLT 211    Recent Labs  12/26/12 2236 12/27/12 0430  NA 134* 137  K 3.6 3.9  CL 98 104  GLUCOSE 172* 115*  BUN 21 15  CREATININE 0.80 0.68  CALCIUM 9.3 8.3*    Imaging:  Scheduled Meds: . enoxaparin (LOVENOX) injection  40 mg Subcutaneous Q24H  . famotidine (PEPCID) IV  20 mg Intravenous Q12H  . levothyroxine  50 mcg Oral QAC breakfast  . nebivolol  10 mg Oral Daily   Continuous Infusions: . sodium chloride 100 mL/hr at 12/27/12 0142   PRN Meds:.acetaminophen, acetaminophen, ondansetron (ZOFRAN) IV, ondansetron   Physical Exam: Filed Vitals:   12/27/12 0532  BP: 150/59  Pulse: 60  Temp: 98.6 F (37 C)  Resp: 16   awake and alert GI- abd soft      Assessment/Plan: 1. GI gastroenteritis - d/c summary by Dr. Elisabeth Pigeon. OK for d/c   [employer] [facility] (o) [phone]; (c) [phone] Call-grp - P[facility]  Tele: [phone]  12/27/2012, 12:47 PM

## 2012-12-27 NOTE — Progress Notes (Signed)
PT Cancellation Note  Patient Details Name: OTIE HEADLEE MRN: [id number] DOB: 30-Jan-1936   Cancelled Treatment:     Pt reports she is back to normal and is going home this afternoon. She says she does not need PT   Donnetta Hail 12/27/2012, 1:08 PM

## 2012-12-27 NOTE — Progress Notes (Signed)
Patient received discharge instructions and verbalized understanding without further questions. Patient belongings packed. Patient follow up appointment discussed and medications discussed. Patient to be taken downstairs via wheelchair to be discharged home.

## 2012-12-27 NOTE — H&P (Signed)
[facility] History and Physical  Kayla Craig ZOX:[id number] DOB: [birth year] DOA: 12/26/2012  Referring physician: Fonnie Jarvis MD PCP: Illene Regulus, MD  Specialists: none  Chief Complaint: vomiting and diarrhea  HPI: Kayla Craig is a 77 y.o. female with PMH as below start to have vomiting and diarrhea 30 min after eating pasta that she cooked at home mixed in [place].  No abd pain. She did feel hot. 3-4 episodes of vomiting and diarrhea x 1. She called EMS. Crawled to door to allow EMS in. Feels very swimmy headed now.    Review of Systems: The patient denies anorexia, fever, weight loss, vision loss, decreased hearing, hoarseness, chest pain, syncope, dyspnea on exertion, peripheral edema, balance deficits,  abdominal pain, melena, hematochezia, hematuria, incontinence, genital sores,  suspicious skin lesions, transient blindness, difficulty walking, depression, unusual weight change, abnormal bleeding, enlarged lymph nodes  breast masses.   Positive for chronic cough with sputum, indigestion,   Past Medical History  Diagnosis Date  . Goiter   . B12 deficiency   . Vitamin D deficiency   . TMJ (dislocation of temporomandibular joint)     Right Side  . 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 Back pain     Chronic  . Hypertension    Past Surgical History  Procedure Laterality Date  . Knee surgery      Right Arthroscopy '05 Dr Charlann Boxer  . Tubal ligation    . Thyroidectomy      August '09 for goiter (Dr Jearld Fenton)  . Eye surgery  2010    cataract surgery with implants   Social History:  reports that she has never smoked. She has never used smokeless tobacco. She reports that she does not drink alcohol. Her drug history is not on file. Live alone - does all ADLs  Allergies  Allergen Reactions  . Codeine Nausea And Vomiting    Dizzy    Family History  Problem Relation Age of Onset  . Cancer Mother   . Heart disease Father     CAD/MI  . Heart disease  Brother     CAD/MI  . Diabetes Neg Hx   . Heart disease Brother     CAD/MI  . Cancer Brother     Prior to Admission medications   Medication Sig Start Date End Date Taking? Authorizing Provider  aspirin 81 MG EC tablet Take 81 mg by mouth daily.     Yes Historical Provider, MD  Cholecalciferol (VITAMIN D-3) 1000 UNITS CAPS Take 1 capsule by mouth daily.   Yes Historical Provider, MD  furosemide (LASIX) 20 MG tablet Take 1 tablet (20 mg total) by mouth daily. 06/06/12  Yes Jacques Navy, MD  levothyroxine (SYNTHROID, LEVOTHROID) 50 MCG tablet Take 1 tablet (50 mcg total) by mouth daily. 07/17/12  Yes Jacques Navy, MD  lisinopril (PRINIVIL,ZESTRIL) 20 MG tablet Take 1 tablet (20 mg total) by mouth daily. 04/21/12  Yes Nicki Reaper, NP  nebivolol (BYSTOLIC) 10 MG tablet Take 10 mg by mouth daily.   Yes Historical Provider, MD  omega-3 acid ethyl esters (LOVAZA) 1 G capsule Take 2 capsules (2 g total) by mouth 2 (two) times daily. 05/10/12  Yes Jacques Navy, MD  ranitidine (ZANTAC) 150 MG tablet Take 150 mg by mouth 2 (two) times daily.   Yes Historical Provider, MD  vitamin B-12 (CYANOCOBALAMIN) 1000 MCG tablet Take 1,000 mcg by mouth daily.     Yes Historical  Provider, MD   Physical Exam: Filed Vitals:   12/27/12 0000  BP: 164/84  Pulse: 73  Temp:   Resp: 18     General:  AAO x 3, no distress  Eyes: PERRLA, EOM I, conjunctiva pink  ENT: dry oral mucosa  Cardiovascular: RRR, no murmurs  Respiratory: CTA b/l   Abdomen: soft NT, ND, BS +  Skin: warm and dry  Psychiatric: mood and affect normal  Neurologic: CN 2-12 intact, strength intact in all 4 extermities  Labs on Admission:  Basic Metabolic Panel:  Recent Labs Lab 12/26/12 2236  NA 134*  K 3.6  CL 98  CO2 22  GLUCOSE 172*  BUN 21  CREATININE 0.80  CALCIUM 9.3   Liver Function Tests:  Recent Labs Lab 12/26/12 2236  AST 20  ALT 25  ALKPHOS 46  BILITOT 0.4  PROT 7.3  ALBUMIN 3.7    Recent  Labs Lab 12/26/12 2236  LIPASE 41   No results found for this basename: AMMONIA,  in the last 168 hours CBC:  Recent Labs Lab 12/26/12 2236  WBC 9.3  NEUTROABS 7.6  HGB 13.6  HCT 40.3  MCV 85.4  PLT 211   Cardiac Enzymes: No results found for this basename: CKTOTAL, CKMB, CKMBINDEX, TROPONINI,  in the last 168 hours  BNP (last 3 results) No results found for this basename: PROBNP,  in the last 8760 hours CBG: No results found for this basename: GLUCAP,  in the last 168 hours  Radiological Exams on Admission: No results found.  EKG: Independently reviewed. Sinus rhythm  With 1st degree AV block  Assessment/Plan Principal Problem:   Gastroenteritis - clear liquids, IVF and antiemetics  Active Problems:   Dehydration - IVF    HYPOTHYROIDISM, UNSPECIFIED - cont synthroid    Hypertriglyceridemia without hypercholesterolemia Hold Lovaza    HYPERTENSION  cont bystolic but hold Lisinopril as is it a diuretic    GERD Change Pepcid to IV     Code Status: Full Code- has a living will  Family Communication:none Disposition Plan: 1-2 days  Time spent: 45 min  Greg Cratty, MD [facility] Pager [phone]  If 7PM-7AM, please contact night-coverage [email or web address] Password [email or web address] 12/27/2012, 1:09 AM

## 2012-12-27 NOTE — Discharge Summary (Signed)
Physician Discharge Summary  Kayla Craig:[id number] DOB: 19-Jul-1935 DOA: 12/26/2012  PCP: Illene Regulus, MD  Admit date: 12/26/2012 Discharge date: 12/27/2012  Recommendations for Outpatient Follow-up:  1. Follow up with Dr. Debby Bud in 1-2 weeks after discharge to make sure symptoms are improving  Discharge Diagnoses:  Principal Problem:   Gastroenteritis Active Problems:   HYPOTHYROIDISM, UNSPECIFIED   Hypertriglyceridemia without hypercholesterolemia   HYPERTENSION   GERD   Dehydration    Discharge Condition: medically stable for discharge home today  Diet recommendation: as tolerated  History of present illness:  77 year old female who presented to [facility] ED with ongoing nausea, vomiting and diarrhea after eating outside (food contained mayo). In ED, vitals were stable and pt received IV fluids with antiemetics.  Hospital Course:  Principal Problem:   Gastroenteritis - continued supportive care with IV fluids - now feels better and tolerates PO intake Active Problems:   HYPERTENSION - continue home meds   GERD - on protonix   Dehydration - continued IV fluids - now feels better   Signed:  Manson Passey, MD  [facility] 12/27/2012, 8:37 AM  Pager #: [phone]  Procedures:  None   Consultations:  None   Discharge Exam: Filed Vitals:   12/27/12 0532  BP: 150/59  Pulse: 60  Temp: 98.6 F (37 C)  Resp: 16   Filed Vitals:   12/27/12 0100 12/27/12 0127 12/27/12 0139 12/27/12 0532  BP: 165/76  136/56 150/59  Pulse: 69  71 60  Temp:  98.1 F (36.7 C) 97.9 F (36.6 C) 98.6 F (37 C)  TempSrc:  Oral Oral Oral  Resp: 15  16 16   Height:   5\' 4"  (1.626 m)   Weight:   83.19 kg (183 lb 6.4 oz)   SpO2: 100%  97% 97%    General: Pt is alert, follows commands appropriately, not in acute distress Cardiovascular: Regular rate and rhythm, S1/S2 +, no murmurs, no rubs, no gallops Respiratory: Clear to auscultation bilaterally, no wheezing, no  crackles, no rhonchi Abdominal: Soft, non tender, non distended, bowel sounds +, no guarding Extremities: no edema, no cyanosis, pulses palpable bilaterally DP and PT Neuro: Grossly nonfocal  Discharge Instructions  Discharge Orders   Future Orders Complete By Expires   Call MD for:  difficulty breathing, headache or visual disturbances  As directed    Call MD for:  persistant dizziness or light-headedness  As directed    Call MD for:  persistant nausea and vomiting  As directed    Call MD for:  severe uncontrolled pain  As directed    Diet - low sodium heart healthy  As directed    Discharge instructions  As directed    Comments:     1. you have been hospitalized for possible viral gastroenteritis. Advance diet slowly. Followup with primary care physician in one to 2 weeks to make sure symptoms have  resolved   Increase activity slowly  As directed        Medication List         aspirin 81 MG EC tablet  Take 81 mg by mouth daily.     furosemide 20 MG tablet  Commonly known as:  LASIX  Take 1 tablet (20 mg total) by mouth daily.     levothyroxine 50 MCG tablet  Commonly known as:  SYNTHROID, LEVOTHROID  Take 1 tablet (50 mcg total) by mouth daily.     lisinopril 20 MG tablet  Commonly known as:  PRINIVIL,ZESTRIL  Take 1 tablet (20 mg total) by mouth daily.     nebivolol 10 MG tablet  Commonly known as:  BYSTOLIC  Take 10 mg by mouth daily.     omega-3 acid ethyl esters 1 G capsule  Commonly known as:  LOVAZA  Take 2 capsules (2 g total) by mouth 2 (two) times daily.     ranitidine 150 MG tablet  Commonly known as:  ZANTAC  Take 150 mg by mouth 2 (two) times daily.     vitamin B-12 1000 MCG tablet  Commonly known as:  CYANOCOBALAMIN  Take 1,000 mcg by mouth daily.     Vitamin D-3 1000 UNITS Caps  Take 1 capsule by mouth daily.           Follow-up Information   Follow up with Illene Regulus, MD In 1 week.   Specialty:  Internal Medicine   Contact  information:   [address] [place] [address] [phone]        The results of significant diagnostics from this hospitalization (including imaging, microbiology, ancillary and laboratory) are listed below for reference.    Significant Diagnostic Studies: No results found.  Microbiology: No results found for this or any previous visit (from the past 240 hour(s)).   Labs: Basic Metabolic Panel:  Recent Labs Lab 12/26/12 2236 12/27/12 0430  NA 134* 137  K 3.6 3.9  CL 98 104  CO2 22 23  GLUCOSE 172* 115*  BUN 21 15  CREATININE 0.80 0.68  CALCIUM 9.3 8.3*   Liver Function Tests:  Recent Labs Lab 12/26/12 2236 12/27/12 0430  AST 20 18  ALT 25 21  ALKPHOS 46 39  BILITOT 0.4 0.3  PROT 7.3 6.3  ALBUMIN 3.7 3.0*    Recent Labs Lab 12/26/12 2236  LIPASE 41   No results found for this basename: AMMONIA,  in the last 168 hours CBC:  Recent Labs Lab 12/26/12 2236  WBC 9.3  NEUTROABS 7.6  HGB 13.6  HCT 40.3  MCV 85.4  PLT 211   Cardiac Enzymes: No results found for this basename: CKTOTAL, CKMB, CKMBINDEX, TROPONINI,  in the last 168 hours BNP: BNP (last 3 results) No results found for this basename: PROBNP,  in the last 8760 hours CBG: No results found for this basename: GLUCAP,  in the last 168 hours  Time coordinating discharge: Over 30 minutes

## 2012-12-27 NOTE — ED Notes (Signed)
Pt attempted to urinate again without production.  Spoke with Dr. Fonnie Jarvis and he will order more fluid

## 2012-12-31 ENCOUNTER — Other Ambulatory Visit: Payer: Self-pay | Admitting: Internal Medicine

## 2013-01-14 ENCOUNTER — Other Ambulatory Visit: Payer: Self-pay | Admitting: Internal Medicine

## 2013-01-14 ENCOUNTER — Ambulatory Visit (INDEPENDENT_AMBULATORY_CARE_PROVIDER_SITE_OTHER): Payer: Medicare Other | Admitting: Internal Medicine

## 2013-01-14 ENCOUNTER — Encounter: Payer: Self-pay | Admitting: Internal Medicine

## 2013-01-14 VITALS — BP 128/90 | HR 81 | Temp 98.9°F | Wt 180.0 lb

## 2013-01-14 DIAGNOSIS — R05 Cough: Secondary | ICD-10-CM

## 2013-01-14 DIAGNOSIS — A088 Other specified intestinal infections: Secondary | ICD-10-CM

## 2013-01-14 DIAGNOSIS — R059 Cough, unspecified: Secondary | ICD-10-CM

## 2013-01-14 DIAGNOSIS — A084 Viral intestinal infection, unspecified: Secondary | ICD-10-CM

## 2013-01-14 NOTE — Patient Instructions (Signed)
1. Viral gastroenteritis - resolved. NOrmal exam. No further testing needed  2. Cough - it may be a side effect of the Lisnopril Plan Stop lisinopril  Take the Benicar for 7 days.  If the cough goes away will send in Rx for losartan.  3. Sore place scalp - may need to check with a dermatologist.

## 2013-01-14 NOTE — Progress Notes (Signed)
Subjective:    Patient ID: Kayla Craig, female    DOB: [birth year], 77 y.o.   MRN: [id number]  HPI Kayla Craig was hospitalized 24 hr Sept 19-20, '14 for viral gastroenteritis with dehydration. Chart reivewed: notes and labs. She was given IV fluids and did wel. Post hopsital she had headache for about a week but this resolved. She did well with PO intake. Her bowel habit has been normal. She is back to herself.  She does c/o dry, hacky non productive cough. She is on ACE-I  Past Medical History  Diagnosis Date  . Goiter   . B12 deficiency   . Vitamin D deficiency   . TMJ (dislocation of temporomandibular joint)     Right Side  . 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 Back pain     Chronic  . Hypertension    Past Surgical History  Procedure Laterality Date  . Knee surgery      Right Arthroscopy '05 Dr Charlann Boxer  . Tubal ligation    . Thyroidectomy      August '09 for goiter (Dr Jearld Fenton)  . Eye surgery  09/06/08    cataract surgery with implants   Family History  Problem Relation Age of Onset  . Cancer Mother   . Heart disease Father     CAD/MI  . Heart disease Brother     CAD/MI  . Diabetes Neg Hx   . Heart disease Brother     CAD/MI  . Cancer Brother    History   Social History  . Marital Status: Single    Spouse Name: N/A    Number of Children: 2  . Years of Education: 12   Occupational History  . retired    Social History Main Topics  . Smoking status: Never Smoker   . Smokeless tobacco: Never Used  . Alcohol Use: No  . Drug Use: Not on file  . Sexual Activity: No   Other Topics Concern  . Not on file   Social History Narrative   HSG. [place]. 2 sons- 06-Sep-2060 (died lung cancer Sep 04, 2022), 09/07/2067; 3 grandchildren. 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 End of life issues: patient would want CPR, DNI, not to be maintained in a vegative state. Laymen's guide  packet provided (july '11)    Current Outpatient Prescriptions on File Prior to Visit  Medication Sig Dispense Refill  . aspirin 81 MG EC tablet Take 81 mg by mouth daily.        Marland Kitchen BYSTOLIC 10 MG tablet TAKE ONE TABLET BY MOUTH ONCE DAILY  30 tablet  0  . Cholecalciferol (VITAMIN D-3) 1000 UNITS CAPS Take 1 capsule by mouth daily.      . furosemide (LASIX) 20 MG tablet Take 1 tablet (20 mg total) by mouth daily.  30 tablet  10  . lisinopril (PRINIVIL,ZESTRIL) 20 MG tablet Take 1 tablet (20 mg total) by mouth daily.  30 tablet  0  . omega-3 acid ethyl esters (LOVAZA) 1 G capsule Take 2 capsules (2 g total) by mouth 2 (two) times daily.  120 capsule  5  . ranitidine (ZANTAC) 150 MG tablet Take 150 mg by mouth 2 (two) times daily.      . vitamin B-12 (CYANOCOBALAMIN) 1000 MCG tablet Take 1,000 mcg by mouth daily.         No current facility-administered medications on file  prior to visit.      Review of Systems System review is negative for any constitutional, cardiac, pulmonary, GI or neuro symptoms or complaints other than as described in the HPI.     Objective:   Physical Exam Filed Vitals:   01/14/13 1353  BP: 128/90  Pulse: 81  Temp: 98.9 F (37.2 C)   Wt Readings from Last 3 Encounters:  01/14/13 180 lb (81.647 kg)  12/27/12 183 lb 6.4 oz (83.19 kg)  05/08/12 178 lb 3.2 oz (80.831 kg)   Gen'l WNWD white woman HEENT- C&S clear Cor - 2+ radial, RRR Pulm - CTAP Abdominal - BS+ , no guarding or rebound, no tenderness.       Assessment & Plan:  1. Cough - may be lisinopril (ACE-I)  Plan  stop lisinopril  Samples of Benicar 20 mg #7  If cough resolves will Rx Losartan.   2. Viral gastroenteritis - resolved.

## 2013-02-09 ENCOUNTER — Telehealth: Payer: Self-pay | Admitting: *Deleted

## 2013-02-09 NOTE — Telephone Encounter (Signed)
Rx for losartan 100 mg once a day, #30 refill x 11

## 2013-02-09 NOTE — Telephone Encounter (Signed)
Pt called states cough resolved after Lisinopril stopped.  Pt is requesting Rx for Benicar.  Please advise

## 2013-02-10 MED ORDER — LOSARTAN POTASSIUM 100 MG PO TABS: 100.0000 mg | ORAL_TABLET | Freq: Every day | ORAL | Status: DC

## 2013-02-10 NOTE — Telephone Encounter (Signed)
Pt notified Rx sent 

## 2013-02-11 ENCOUNTER — Other Ambulatory Visit: Payer: Self-pay | Admitting: Family Medicine

## 2013-02-11 MED ORDER — NEBIVOLOL HCL 10 MG PO TABS: ORAL_TABLET | ORAL | Status: DC

## 2013-02-11 NOTE — Telephone Encounter (Signed)
RX refill sent to [facility].  Forwarded to Dr. Debby Bud, pt's PCP.

## 2013-03-17 ENCOUNTER — Other Ambulatory Visit: Payer: Self-pay

## 2013-03-17 MED ORDER — NEBIVOLOL HCL 10 MG PO TABS: ORAL_TABLET | ORAL | Status: DC

## 2013-04-13 ENCOUNTER — Ambulatory Visit (INDEPENDENT_AMBULATORY_CARE_PROVIDER_SITE_OTHER): Payer: Medicare PPO | Admitting: Internal Medicine

## 2013-04-13 ENCOUNTER — Encounter: Payer: Self-pay | Admitting: Internal Medicine

## 2013-04-13 VITALS — BP 142/88 | HR 64 | Temp 98.2°F | Wt 178.0 lb

## 2013-04-13 DIAGNOSIS — J069 Acute upper respiratory infection, unspecified: Secondary | ICD-10-CM

## 2013-04-13 MED ORDER — PROMETHAZINE-CODEINE 6.25-10 MG/5ML PO SYRP: 5.0000 mL | ORAL_SOLUTION | ORAL | Status: DC | PRN

## 2013-04-13 MED ORDER — AZITHROMYCIN 250 MG PO TABS: ORAL_TABLET | ORAL | Status: DC

## 2013-04-13 NOTE — Progress Notes (Signed)
Pre visit review using our clinic review tool, if applicable. No additional management support is needed unless otherwise documented below in the visit note. 

## 2013-04-13 NOTE — Progress Notes (Signed)
Subjective:    Patient ID: Kayla Craig, female    DOB: 02/05/1936, 78 y.o.   MRN: [id number]  HPI Kayla Craig presents for a week long h/o cold like symptoms: cough - productive of thick tenacious mucus, disturbing her sleep. She may have had low grade fever at the onset but no documented fever since. She denies any shortness of breath. No N/V/D  Past Medical History  Diagnosis Date  . Goiter   . B12 deficiency   . Vitamin D deficiency   . TMJ (dislocation of temporomandibular joint)     Right Side  . 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 Back pain     Chronic  . Hypertension    Past Surgical History  Procedure Laterality Date  . Knee surgery      Right Arthroscopy '05 Dr Charlann Boxer  . Tubal ligation    . Thyroidectomy      August '09 for goiter (Dr Jearld Fenton)  . Eye surgery  07-Sep-2008    cataract surgery with implants   Family History  Problem Relation Age of Onset  . Cancer Mother   . Heart disease Father     CAD/MI  . Heart disease Brother     CAD/MI  . Diabetes Neg Hx   . Heart disease Brother     CAD/MI  . Cancer Brother    History   Social History  . Marital Status: Single    Spouse Name: N/A    Number of Children: 2  . Years of Education: 12   Occupational History  . retired    Social History Main Topics  . Smoking status: Never Smoker   . Smokeless tobacco: Never Used  . Alcohol Use: No  . Drug Use: Not on file  . Sexual Activity: No   Other Topics Concern  . Not on file   Social History Narrative   HSG. [place]. 2 sons- Sep 07, 2060 (died lung cancer Sep 06, 2022), 09/08/2067; 3 grandchildren. 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 End of life issues: patient would want CPR, DNI, not to be maintained in a vegative state. Laymen's guide packet provided (july '11)     Current Outpatient Prescriptions on File Prior to Visit  Medication Sig Dispense Refill  .  aspirin 81 MG EC tablet Take 81 mg by mouth daily.        . Cholecalciferol (VITAMIN D-3) 1000 UNITS CAPS Take 1 capsule by mouth daily.      . furosemide (LASIX) 20 MG tablet Take 1 tablet (20 mg total) by mouth daily.  30 tablet  10  . levothyroxine (SYNTHROID, LEVOTHROID) 50 MCG tablet TAKE ONE TABLET BY MOUTH ONCE DAILY  30 tablet  5  . lisinopril (PRINIVIL,ZESTRIL) 20 MG tablet Take 1 tablet (20 mg total) by mouth daily.  30 tablet  0  . losartan (COZAAR) 100 MG tablet Take 1 tablet (100 mg total) by mouth daily.  30 tablet  11  . nebivolol (BYSTOLIC) 10 MG tablet TAKE ONE TABLET BY MOUTH ONCE DAILY  30 tablet  5  . omega-3 acid ethyl esters (LOVAZA) 1 G capsule Take 2 capsules (2 g total) by mouth 2 (two) times daily.  120 capsule  5  . ranitidine (ZANTAC) 150 MG tablet Take 150 mg by mouth 2 (two) times daily.      . vitamin B-12 (CYANOCOBALAMIN) 1000 MCG tablet Take  1,000 mcg by mouth daily.         No current facility-administered medications on file prior to visit.       Review of Systems System review is negative for any constitutional, cardiac, pulmonary, GI or neuro symptoms or complaints other than as described in the HPI.     Objective:   Physical Exam Filed Vitals:   04/13/13 1212  BP: 142/88  Pulse: 64  Temp: 98.2 F (36.8 C)   Gen'l- WNWD woman in no acute distress HEENT - C&S clear, no mucus discharge Cor - RRR Pulm - normal breath sounds, no rales or wheezes, no increased WOB Neuor - A&O x 3       Assessment & Plan:  Upper respiratory infection - no signs of influenza, no signs of pneumonia  Plan Z-pak  Phenergan with codeine cough syrup 1 tsp every 6 hours as needed for cough  Hydrate  Vitamin C  Tylenol of any fever or aches.

## 2013-04-13 NOTE — Patient Instructions (Signed)
Upper respiratory infection - no signs of influenza, no signs of pneumonia  Plan Z-pak  Phenergan with codeine cough syrup 1 tsp every 6 hours as needed for cough  Hydrate  Vitamin C  Tylenol of any fever or aches.

## 2013-05-07 ENCOUNTER — Telehealth: Payer: Self-pay | Admitting: Internal Medicine

## 2013-05-07 NOTE — Telephone Encounter (Signed)
Pt needs a [employer] referral to Dr. Carles Colletonald Digsby for a medical eye exam.  DX code [phone].  His office fax [phone]. The appointment is on Feb. 10 at 9:45. She will bring the card on Fri Jan 30.

## 2013-05-07 NOTE — Telephone Encounter (Signed)
OK - pass along information to ColgateDebra Craig

## 2013-05-14 ENCOUNTER — Telehealth: Payer: Self-pay | Admitting: Internal Medicine

## 2013-05-14 NOTE — Telephone Encounter (Signed)
Per pt call wanted our office to submit  a referral to Dr Hazle Quantigby appt scheduled for 05-19-2013 , faxed to [employer] @[phone] awaiting authorization

## 2013-05-21 ENCOUNTER — Telehealth: Payer: Self-pay | Admitting: Internal Medicine

## 2013-05-21 ENCOUNTER — Telehealth: Payer: Self-pay

## 2013-05-21 NOTE — Telephone Encounter (Addendum)
Received authorization back from [employer] for pt to see dr Hazle Quantdigby  Authorization #[id number] [facility]FERRAL  NUMBER OF VISITS 4 start 05-19-2013 expire 08-16-2013

## 2013-05-21 NOTE — Telephone Encounter (Signed)
Phone call from patient [phone] states she is returning your call

## 2013-07-15 ENCOUNTER — Telehealth: Payer: Self-pay | Admitting: Internal Medicine

## 2013-07-15 MED ORDER — FUROSEMIDE 20 MG PO TABS: 20.0000 mg | ORAL_TABLET | Freq: Every day | ORAL | Status: DC

## 2013-07-15 MED ORDER — NEBIVOLOL HCL 10 MG PO TABS: ORAL_TABLET | ORAL | Status: DC

## 2013-07-15 MED ORDER — LEVOTHYROXINE SODIUM 50 MCG PO TABS: 50.0000 ug | ORAL_TABLET | Freq: Every day | ORAL | Status: DC

## 2013-07-15 MED ORDER — LOSARTAN POTASSIUM 100 MG PO TABS: 100.0000 mg | ORAL_TABLET | Freq: Every day | ORAL | Status: DC

## 2013-07-15 NOTE — Telephone Encounter (Signed)
Pt is out of furosemide.  She needs it called into [facility] on [address].   She is a pt of Dr. Debby BudNorins.  She is on Dr. Loann QuillKollars list for the fall.

## 2013-07-15 NOTE — Telephone Encounter (Signed)
Patient informed of furosemide refill.  Also did while on the phone request other refills that were taken care of at the time for this patient.

## 2014-01-11 ENCOUNTER — Other Ambulatory Visit (INDEPENDENT_AMBULATORY_CARE_PROVIDER_SITE_OTHER): Payer: Medicare HMO

## 2014-01-11 ENCOUNTER — Encounter: Payer: Self-pay | Admitting: Internal Medicine

## 2014-01-11 ENCOUNTER — Ambulatory Visit (INDEPENDENT_AMBULATORY_CARE_PROVIDER_SITE_OTHER): Payer: Medicare HMO | Admitting: Internal Medicine

## 2014-01-11 VITALS — BP 172/90 | HR 72 | Temp 98.7°F | Resp 18 | Ht 65.0 in | Wt 185.8 lb

## 2014-01-11 DIAGNOSIS — E039 Hypothyroidism, unspecified: Secondary | ICD-10-CM

## 2014-01-11 DIAGNOSIS — Z0000 Encounter for general adult medical examination without abnormal findings: Secondary | ICD-10-CM

## 2014-01-11 DIAGNOSIS — K219 Gastro-esophageal reflux disease without esophagitis: Secondary | ICD-10-CM

## 2014-01-11 DIAGNOSIS — E781 Pure hyperglyceridemia: Secondary | ICD-10-CM

## 2014-01-11 DIAGNOSIS — I10 Essential (primary) hypertension: Secondary | ICD-10-CM

## 2014-01-11 DIAGNOSIS — Z23 Encounter for immunization: Secondary | ICD-10-CM

## 2014-01-11 LAB — BASIC METABOLIC PANEL
BUN: 19 mg/dL (ref 6–23)
CO2: 25 mEq/L (ref 19–32)
CREATININE: 0.9 mg/dL (ref 0.4–1.2)
Calcium: 9.5 mg/dL (ref 8.4–10.5)
Chloride: 100 mEq/L (ref 96–112)
GFR: 63.04 mL/min (ref >60.00)
Glucose, Bld: 71 mg/dL (ref 70–99)
POTASSIUM: 4 meq/L (ref 3.5–5.1)
Sodium: 133 mEq/L — ABNORMAL LOW (ref 135–145)

## 2014-01-11 LAB — TSH: TSH: 3.3 u[IU]/mL (ref 0.35–4.50)

## 2014-01-11 LAB — LIPID PANEL
CHOLESTEROL: 305 mg/dL — AB (ref 0–200)
HDL: 37.5 mg/dL — ABNORMAL LOW (ref >39.00)
NonHDL: 267.5
Total CHOL/HDL Ratio: 8
Triglycerides: 903 mg/dL — ABNORMAL HIGH (ref 0.0–149.0)
VLDL: 180.6 mg/dL — ABNORMAL HIGH (ref 0.0–40.0)

## 2014-01-11 LAB — LDL CHOLESTEROL, DIRECT: Direct LDL: 110.2 mg/dL

## 2014-01-11 MED ORDER — PANTOPRAZOLE SODIUM 40 MG PO TBEC: 40.0000 mg | DELAYED_RELEASE_TABLET | Freq: Every day | ORAL | Status: DC

## 2014-01-11 MED ORDER — HYDROCHLOROTHIAZIDE 12.5 MG PO CAPS: 12.5000 mg | ORAL_CAPSULE | Freq: Every day | ORAL | Status: DC

## 2014-01-11 NOTE — Progress Notes (Signed)
   Subjective:    Patient ID: Kayla Craig, female    DOB: 28-Feb-1936, 78 y.o.   MRN: [id number]  HPI The patient is a 78 YO woman who is coming in today to establish care. She has PMH of B12 deficiency, GERD, hypothyroidism, HTN. She has been doing well overall and feels that she has a chronic cough. She does not take anything for her acid reflux but mustard has been helping the symptoms and she is using this. She does still have a cough, not productive. She occasionally has some food that gets stuck in her throat and she has to drink extra liquids to get it down. She denies chest pains, SOB, abdominal pain. She denies diarrhea or constipation. She has seen a GI doctor in the past and thinks it is likely time for her next colonoscopy. She does get occasional headaches and is not having one today. She thinks that her blood pressure usually runs somewhat high.   Review of Systems  Constitutional: Negative for fever, activity change, appetite change and fatigue.  Eyes: Negative.   Respiratory: Positive for cough. Negative for chest tightness, shortness of breath and wheezing.   Cardiovascular: Negative for chest pain, palpitations and leg swelling.  Gastrointestinal: Negative for nausea, abdominal pain, diarrhea, constipation and abdominal distention.  Genitourinary: Negative.   Musculoskeletal: Negative for arthralgias and myalgias.  Skin: Negative.   Neurological: Positive for headaches. Negative for dizziness, weakness, light-headedness and numbness.      Objective:   Physical Exam  Vitals reviewed. Constitutional: She is oriented to person, place, and time. She appears well-developed and well-nourished. No distress.  HENT:  Head: Normocephalic and atraumatic.  Eyes: EOM are normal.  Neck: Normal range of motion.  Cardiovascular: Normal rate and regular rhythm.   Pulmonary/Chest: Effort normal and breath sounds normal. No respiratory distress. She has no wheezes. She has no rales.    Abdominal: Soft. Bowel sounds are normal. She exhibits no distension. There is no tenderness. There is no rebound.  Neurological: She is alert and oriented to person, place, and time. Coordination normal.  Skin: Skin is warm and dry.   Filed Vitals:   01/11/14 1306  BP: 172/90  Pulse: 72  Temp: 98.7 F (37.1 C)  TempSrc: Oral  Resp: 18  Height: 5\' 5"  (1.651 m)  Weight: 185 lb 12.8 oz (84.278 kg)  SpO2: 96%      Assessment & Plan:  Flu shot given today.

## 2014-01-11 NOTE — Assessment & Plan Note (Signed)
Patient due for repeat colonoscopy and referral placed for GI, flu shot given today.

## 2014-01-11 NOTE — Patient Instructions (Addendum)
We have sent in protonix for your stomach. Take 1 pill before breakfast to help with the acid reflux. If it helps please keep taking it. It may take up to 2-3 months of good control for the cough to disappear.   STOP furosemide (or lasix). Start taking HCTZ (hyrochlorothiazide) 1 pill per day in the morning.  We will check your blood work today and call you with the results.   We will have you go back to see Dr. Randa Evens for your stomach and your colonoscopy.  Gastroesophageal Reflux Disease, Adult Gastroesophageal reflux disease (GERD) happens when acid from your stomach flows up into the esophagus. When acid comes in contact with the esophagus, the acid causes soreness (inflammation) in the esophagus. Over time, GERD may create small holes (ulcers) in the lining of the esophagus. CAUSES   Increased body weight. This puts pressure on the stomach, making acid rise from the stomach into the esophagus.  Smoking. This increases acid production in the stomach.  Drinking alcohol. This causes decreased pressure in the lower esophageal sphincter (valve or ring of muscle between the esophagus and stomach), allowing acid from the stomach into the esophagus.  Late evening meals and a full stomach. This increases pressure and acid production in the stomach.  A malformed lower esophageal sphincter. Sometimes, no cause is found. SYMPTOMS   Burning pain in the lower part of the mid-chest behind the breastbone and in the mid-stomach area. This may occur twice a week or more often.  Trouble swallowing.  Sore throat.  Dry cough.  Asthma-like symptoms including chest tightness, shortness of breath, or wheezing. DIAGNOSIS  Your caregiver may be able to diagnose GERD based on your symptoms. In some cases, X-rays and other tests may be done to check for complications or to check the condition of your stomach and esophagus. TREATMENT  Your caregiver may recommend over-the-counter or prescription  medicines to help decrease acid production. Ask your caregiver before starting or adding any new medicines.  HOME CARE INSTRUCTIONS   Change the factors that you can control. Ask your caregiver for guidance concerning weight loss, quitting smoking, and alcohol consumption.  Avoid foods and drinks that make your symptoms worse, such as:  Caffeine or alcoholic drinks.  Chocolate.  Peppermint or mint flavorings.  Garlic and onions.  Spicy foods.  Citrus fruits, such as oranges, lemons, or limes.  Tomato-based foods such as sauce, chili, salsa, and pizza.  Fried and fatty foods.  Avoid lying down for the 3 hours prior to your bedtime or prior to taking a nap.  Eat small, frequent meals instead of large meals.  Wear loose-fitting clothing. Do not wear anything tight around your waist that causes pressure on your stomach.  Raise the head of your bed 6 to 8 inches with wood blocks to help you sleep. Extra pillows will not help.  Only take over-the-counter or prescription medicines for pain, discomfort, or fever as directed by your caregiver.  Do not take aspirin, ibuprofen, or other nonsteroidal anti-inflammatory drugs (NSAIDs). SEEK IMMEDIATE MEDICAL CARE IF:   You have pain in your arms, neck, jaw, teeth, or back.  Your pain increases or changes in intensity or duration.  You develop nausea, vomiting, or sweating (diaphoresis).  You develop shortness of breath, or you faint.  Your vomit is green, yellow, black, or looks like coffee grounds or blood.  Your stool is red, bloody, or black. These symptoms could be signs of other problems, such as heart disease, gastric bleeding,  or esophageal bleeding. MAKE SURE YOU:   Understand these instructions.  Will watch your condition.  Will get help right away if you are not doing well or get worse. Document Released: 01/03/2005 Document Revised: 06/18/2011 Document Reviewed: 10/13/2010 [facility] Patient Information 2015  [place], [place]. This information is not intended to replace advice given to you by your health care provider. Make sure you discuss any questions you have with your health care provider.

## 2014-01-11 NOTE — Progress Notes (Signed)
Pre visit review using our clinic review tool, if applicable. No additional management support is needed unless otherwise documented below in the visit note. 

## 2014-01-11 NOTE — Assessment & Plan Note (Signed)
This is likely the cause of her cough and will rx protonix and have her trial this for her acid reflux. Advised her if it does work that her cough may take 2-3 months to resolve. She will also see GI for her occasional food sticking and repeat colonoscopy.

## 2014-01-11 NOTE — Assessment & Plan Note (Signed)
Check lipid panel today. She is currently taking fish oil daily.

## 2014-01-11 NOTE — Assessment & Plan Note (Signed)
Check TSH today as it has not been checked in some time.

## 2014-01-11 NOTE — Assessment & Plan Note (Signed)
BP elevated and has been several times in the past. Will stop lasix 20 mg daily and start HCTZ 12.5 mg daily. BMP check today. Continue losartan 100 mg daily and bystolic 10 mg daily. At next visit can increase HCTZ if needed.

## 2014-01-15 ENCOUNTER — Other Ambulatory Visit: Payer: Self-pay | Admitting: Internal Medicine

## 2014-02-12 ENCOUNTER — Telehealth: Payer: Self-pay | Admitting: Internal Medicine

## 2014-02-16 ENCOUNTER — Other Ambulatory Visit: Payer: Self-pay | Admitting: Geriatric Medicine

## 2014-02-16 MED ORDER — LEVOTHYROXINE SODIUM 50 MCG PO TABS: ORAL_TABLET | ORAL | Status: DC

## 2014-02-16 NOTE — Telephone Encounter (Signed)
Pt called in again, she is very upset that her meds have not been filled and sent to [facility] for levothyroxine (SYNTHROID, LEVOTHROID) 50 MCG tablet [Pharmacy Med Name: LEVOTHYROXIN TAB]

## 2014-03-19 ENCOUNTER — Other Ambulatory Visit: Payer: Self-pay | Admitting: Geriatric Medicine

## 2014-03-19 MED ORDER — NEBIVOLOL HCL 10 MG PO TABS: ORAL_TABLET | ORAL | Status: DC

## 2014-05-20 ENCOUNTER — Telehealth: Payer: Self-pay | Admitting: Internal Medicine

## 2014-05-21 ENCOUNTER — Other Ambulatory Visit: Payer: Self-pay | Admitting: Geriatric Medicine

## 2014-05-21 MED ORDER — PANTOPRAZOLE SODIUM 40 MG PO TBEC: 40.0000 mg | DELAYED_RELEASE_TABLET | Freq: Every day | ORAL | Status: DC

## 2014-05-21 MED ORDER — HYDROCHLOROTHIAZIDE 12.5 MG PO CAPS: 12.5000 mg | ORAL_CAPSULE | Freq: Every day | ORAL | Status: DC

## 2014-05-21 NOTE — Telephone Encounter (Signed)
Please refill hydrochlorothiazide (MICROZIDE) 12.5 MG capsule [[id number]][[id number]] ,   pantoprazole (PROTONIX) 40 MG tablet [[id number]][[id number]]      Sent to [facility] on [address]

## 2014-05-21 NOTE — Telephone Encounter (Signed)
Sent to pharmacy 

## 2014-06-16 ENCOUNTER — Other Ambulatory Visit: Payer: Self-pay | Admitting: Internal Medicine

## 2014-07-29 ENCOUNTER — Other Ambulatory Visit: Payer: Self-pay | Admitting: Internal Medicine

## 2014-10-04 ENCOUNTER — Other Ambulatory Visit: Payer: Self-pay | Admitting: Internal Medicine

## 2014-10-07 ENCOUNTER — Other Ambulatory Visit: Payer: Self-pay | Admitting: Internal Medicine

## 2014-11-01 ENCOUNTER — Other Ambulatory Visit: Payer: Self-pay | Admitting: Internal Medicine

## 2014-11-10 ENCOUNTER — Other Ambulatory Visit: Payer: Self-pay | Admitting: Internal Medicine

## 2014-11-30 ENCOUNTER — Encounter: Payer: Self-pay | Admitting: Family

## 2014-11-30 ENCOUNTER — Ambulatory Visit (INDEPENDENT_AMBULATORY_CARE_PROVIDER_SITE_OTHER): Payer: PPO | Admitting: Family

## 2014-11-30 VITALS — BP 182/106 | HR 67 | Temp 97.8°F | Resp 18 | Ht 65.0 in | Wt 189.0 lb

## 2014-11-30 DIAGNOSIS — I10 Essential (primary) hypertension: Secondary | ICD-10-CM | POA: Diagnosis not present

## 2014-11-30 DIAGNOSIS — M25562 Pain in left knee: Secondary | ICD-10-CM | POA: Insufficient documentation

## 2014-11-30 MED ORDER — HYDROCHLOROTHIAZIDE 25 MG PO TABS: 25.0000 mg | ORAL_TABLET | Freq: Every day | ORAL | Status: DC

## 2014-11-30 NOTE — Patient Instructions (Signed)
Thank you for choosing [facility].  Summary/Instructions:  Your prescription(s) have been submitted to your pharmacy or been printed and provided for you. Please take as directed and contact our office if you believe you are having problem(s) with the medication(s) or have any questions.  If your symptoms worsen or fail to improve, please contact our office for further instruction, or in case of emergency go directly to the emergency room at the closest medical facility.    cases).   HEAT AND COLD  Cold treatment (icing) relieves pain and reduces inflammation. Cold treatment should be applied for 10 to 15 minutes every 2 to 3 hours, and immediately after activity that aggravates your symptoms. Use ice packs or an ice massage.  Heat treatment may be used before performing the stretching and strengthening activities prescribed by your caregiver, physical therapist, or athletic trainer. Use a heat pack or a warm water soak. SEEK MEDICAL CARE IF:   Symptoms get worse or do not improve in 2 weeks, despite treatment.  New, unexplained symptoms develop. (Drugs used in treatment may produce side effects.) EXERCISES RANGE OF MOTION (ROM) AND STRETCHING EXERCISES - Hamstring Strain These exercises may help you when beginning to rehabilitate your injury. Your symptoms may go away with or without further involvement from your physician, physical therapist or athletic trainer. While completing these exercises, remember:   Restoring tissue flexibility helps normal motion to return to the joints. This allows healthier, less painful movement and activity.  An effective stretch should be held for at least 30 seconds.  A stretch should never be painful. You should only feel a gentle lengthening or release in the stretched tissue. STRETCH - Hamstrings, Standing  Stand or sit, and extend your right / left leg, placing your foot on a chair or foot stool.  Keep a slight arch in your low back and  your hips straight forward.  Lead with your chest, and lean forward at the waist until you feel a gentle stretch in the back of your right / left knee or thigh. (When done correctly, this exercise requires leaning only a small distance.)  Hold this position for __________ seconds. Repeat __________ times. Complete this stretch __________ times per day. STRETCH - Hamstrings, Supine   Lie on your back. Loop a belt or towel over the ball of your right / left foot.  Straighten your right / left knee and slowly pull on the belt to raise your leg. Do not allow the right / left knee to bend. Keep your opposite leg flat on the floor.  Raise the leg until you feel a gentle stretch behind your right / left knee or thigh. Hold this position for __________ seconds. Repeat __________ times. Complete this stretch __________ times per day.  STRETCH - Hamstrings, Doorway  Lie on your back with your right / left leg extended and resting on the wall, and the opposite leg flat on the ground through the door. Initially, position your bottom farther away from the wall.  Keep your right / left knee straight. If you feel a stretch behind your knee or thigh, hold this position for __________ seconds.  If you do not feel a stretch, scoot your bottom closer to the door and hold __________ seconds. Repeat __________ times. Complete this stretch __________ times per day.  STRETCH - Hamstrings/Adductors, V-Sit   Sit on the floor with your legs extended in a large "V," keeping your knees straight.  With your head and chest upright, bend  at your waist reaching for your left foot to stretch your right thigh muscles.  You should feel a stretch in your right inner thigh. Hold for __________ seconds.  Return to the upright position to relax your leg muscles.  Continuing to keep your chest upright, bend straight forward at your waist to stretch your hamstrings.  You should feel a stretch behind both of your thighs and  knees. Hold for __________ seconds.  Return to the upright position to relax your leg muscles.  With your head and chest upright, bend at your waist reaching for your right foot to stretch your left thigh muscles.  You should feel a stretch in your left inner thigh. Hold for __________ seconds.  Return to the upright position to relax your leg muscles. Repeat __________ times. Complete this exercise __________ times per day.  STRENGTHENING EXERCISES - Hamstring Strain These exercises may help you when beginning to rehabilitate your injury. They may resolve your symptoms with or without further involvement from your physician, physical therapist or athletic trainer. While completing these exercises, remember:   Muscles can gain both the endurance and the strength needed for everyday activities through controlled exercises.  Complete these exercises as instructed by your physician, physical therapist or athletic trainer. Increase the resistance and repetitions only as guided.  You may experience muscle soreness or fatigue, but the pain or discomfort you are trying to eliminate should never get worse during these exercises. If this pain does get worse, stop and make certain you are following the directions exactly. If the pain is still present after adjustments, discontinue the exercise until you can discuss the trouble with your clinician. STRENGTH - Hip Extensors, Straight Leg Raises   Lie on your stomach on a firm surface.  Tense the muscles in your buttocks to lift your right / left leg about 4 inches. If you cannot lift your leg this high without arching your back, place a pillow under your hips.  Keep your knee straight. Hold for __________ seconds.  Slowly lower your leg to the starting position and allow it to relax completely before starting the next repetition. Repeat __________ times. Complete this exercise __________ times per day.  STRENGTH - Hamstring, Isometrics   Lie on  your back on a firm surface.  Bend your right / left knee approximately __________ degrees.  Dig your heel into the surface, as if you are trying to pull it toward your buttocks. Tighten the muscles in the back of your thighs to "dig" as hard as you can, without increasing any pain.  Hold this position for __________ seconds.  Release the tension gradually and allow your muscles to completely relax for __________ seconds between each exercise. Repeat __________ times. Complete this exercise __________ times per day.  STRENGTH - Hamstring, Curls   Lay on your stomach with your legs extended. (If you lay on a bed, your feet may hang over the edge.)  Tighten the muscles in the back of your thigh to bend your right / left knee up to 90 degrees. Keep your hips flat on the bed or floor.  Hold this position for __________ seconds.  Slowly lower your leg back to the starting position. Repeat __________ times. Complete this exercise __________ times per day.  OPTIONAL ANKLE WEIGHTS: Begin with ____________________, but DO NOT exceed ____________________. Increase in 1 pound/0.5 kilogram increments. Document Released: 03/26/2005 Document Revised: 06/18/2011 Document Reviewed: 07/08/2008 [facility] Patient Information 2015 [place], [place]. This information is not intended to replace  advice given to you by your health care provider. Make sure you discuss any questions you have with your health care provider.

## 2014-11-30 NOTE — Progress Notes (Signed)
Subjective:    Patient ID: Kayla Craig, female    DOB: 1936-01-30, 79 y.o.   MRN: [id number]  Chief Complaint  Patient presents with  . Leg Pain    x4 weeks, has beeing having leg pain in both legs, states hurts all he way to the tip of her toes, can barely sleep at night, also says her BP was up yesterday 170/103, legs started hurting when she took potassium, stopped taking it but she states her legs still hurt    HPI:  Kayla Craig is a 79 y.o. female with a PMH of vitamin D deficiency, hypothyroidism, hypertriglyceridemia, GERD, and B12 deficiency who presents today  for an acute office visit.    1.) Bilateral leg pain - Associated symptom of pain located in her bilateral legs from the knees down and has been going on for about 4 weeks. Timing of the pain is constant and described as throbbing with a severity of about 7-8/10. Symptoms are worst at night. Modifying factors include Advil which has helped on occasion. States that her legs give out. Today the pain is not as bad.   2.) Hypertension - Currently maintained on losartan and hydrocholorthiazide. Indicates her blood pressure was elevated yesterday at 170/103. Takes her medications as prescribed and denies adverse side effects.   BP Readings from Last 3 Encounters:  11/30/14 182/106  01/11/14 172/90  04/13/13 142/88    Allergies  Allergen Reactions  . Codeine Nausea And Vomiting    Dizzy    Current Outpatient Prescriptions on File Prior to Visit  Medication Sig Dispense Refill  . aspirin 81 MG EC tablet Take 81 mg by mouth daily.      Marland Kitchen BYSTOLIC 10 MG tablet TAKE ONE TABLET BY MOUTH ONCE DAILY 30 tablet 0  . Cholecalciferol (VITAMIN D-3) 1000 UNITS CAPS Take 1 capsule by mouth daily.    Marland Kitchen levothyroxine (SYNTHROID, LEVOTHROID) 50 MCG tablet TAKE ONE TABLET BY MOUTH ONCE DAILY BEFORE BREAKFAST 90 tablet 2  . losartan (COZAAR) 100 MG tablet TAKE ONE TABLET BY MOUTH ONCE DAILY 30 tablet 5  . omega-3 acid ethyl esters  (LOVAZA) 1 G capsule Take 2 capsules (2 g total) by mouth 2 (two) times daily. 120 capsule 5  . pantoprazole (PROTONIX) 40 MG tablet TAKE ONE TABLET BY MOUTH ONCE DAILY 30 tablet 0  . vitamin B-12 (CYANOCOBALAMIN) 1000 MCG tablet Take 1,000 mcg by mouth daily.       No current facility-administered medications on file prior to visit.    Past Medical History  Diagnosis Date  . Goiter   . B12 deficiency   . Vitamin D deficiency   . TMJ (dislocation of temporomandibular joint)     Right Side  . 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 Back pain     Chronic  . Hypertension      Review of Systems  Constitutional: Negative for fever and chills.  Eyes:       Negative for changes in vision.  Respiratory: Negative for chest tightness.   Cardiovascular: Negative for chest pain, palpitations and leg swelling.  Musculoskeletal: Positive for arthralgias.      Objective:    BP 182/106 mmHg  Pulse 67  Temp(Src) 97.8 F (36.6 C) (Oral)  Resp 18  Ht 5\' 5"  (1.651 m)  Wt 189 lb (85.73 kg)  BMI 31.45 kg/m2  SpO2 99% Nursing note and vital signs reviewed.  Physical Exam  Constitutional:  She is oriented to person, place, and time. She appears well-developed and well-nourished. No distress.  Cardiovascular: Normal rate, regular rhythm, normal heart sounds and intact distal pulses.   Pulmonary/Chest: Effort normal and breath sounds normal.  Musculoskeletal:  Left knee - No obvious deformity or discoloration noted. Mild edema. Tenderness elicited popliteal fossa and medial hamstring tendons. ROM and strength are normal. Distal pulses and sensation are intact and appropriate. Ligamentous and meniscal testing negative.   Neurological: She is alert and oriented to person, place, and time.  Skin: Skin is warm and dry.  Psychiatric: She has a normal mood and affect. Her behavior is normal. Judgment and thought content normal.       Assessment & Plan:   Problem List Items  Addressed This Visit      Cardiovascular and Mediastinum   HYPERTENSION    Blood pressure remains uncontrolled and greater than goal of 140/90. Increase hydrochlorothiazide. Continue current dosages of Bystolic and losartan. Follow-up in 2 weeks for basic metabolic panel to check potassium. Continue to monitor blood pressure at home.      Relevant Medications   hydrochlorothiazide (HYDRODIURIL) 25 MG tablet   Other Relevant Orders   Basic Metabolic Panel (BMET)     Other   Left knee pain - Primary    Left posterior knee pain in area of hamstring tendon, however cannot rule out arthritis. Treat conservatively with over-the-counter anti-inflammatories as needed, ice, and home exercise therapy provided. Follow-up if symptoms worsen or fail to improve.

## 2014-11-30 NOTE — Assessment & Plan Note (Signed)
Blood pressure remains uncontrolled and greater than goal of 140/90. Increase hydrochlorothiazide. Continue current dosages of Bystolic and losartan. Follow-up in 2 weeks for basic metabolic panel to check potassium. Continue to monitor blood pressure at home.

## 2014-11-30 NOTE — Assessment & Plan Note (Addendum)
Left posterior knee pain in area of hamstring tendon, however cannot rule out arthritis. Treat conservatively with over-the-counter anti-inflammatories as needed, ice, and home exercise therapy provided. Follow-up if symptoms worsen or fail to improve.

## 2014-11-30 NOTE — Progress Notes (Signed)
Pre visit review using our clinic review tool, if applicable. No additional management support is needed unless otherwise documented below in the visit note. 

## 2014-12-03 ENCOUNTER — Other Ambulatory Visit: Payer: Self-pay | Admitting: Internal Medicine

## 2014-12-06 ENCOUNTER — Telehealth: Payer: Self-pay

## 2014-12-06 NOTE — Telephone Encounter (Signed)
Agreed to come in for AWV on 9/6 at 10am / apt scheduled for 9/12 with Dr. Dorise Hiss

## 2014-12-10 ENCOUNTER — Other Ambulatory Visit: Payer: Self-pay | Admitting: Internal Medicine

## 2014-12-14 ENCOUNTER — Ambulatory Visit (INDEPENDENT_AMBULATORY_CARE_PROVIDER_SITE_OTHER): Payer: PPO

## 2014-12-14 ENCOUNTER — Other Ambulatory Visit (INDEPENDENT_AMBULATORY_CARE_PROVIDER_SITE_OTHER): Payer: PPO

## 2014-12-14 ENCOUNTER — Telehealth: Payer: Self-pay | Admitting: Family

## 2014-12-14 VITALS — BP 180/88 | Ht 65.0 in | Wt 187.2 lb

## 2014-12-14 DIAGNOSIS — I10 Essential (primary) hypertension: Secondary | ICD-10-CM

## 2014-12-14 DIAGNOSIS — Z1231 Encounter for screening mammogram for malignant neoplasm of breast: Secondary | ICD-10-CM

## 2014-12-14 DIAGNOSIS — Z0000 Encounter for general adult medical examination without abnormal findings: Secondary | ICD-10-CM | POA: Diagnosis not present

## 2014-12-14 DIAGNOSIS — Z23 Encounter for immunization: Secondary | ICD-10-CM | POA: Diagnosis not present

## 2014-12-14 LAB — BASIC METABOLIC PANEL
BUN: 24 mg/dL — ABNORMAL HIGH (ref 6–23)
CHLORIDE: 103 meq/L (ref 96–112)
CO2: 25 meq/L (ref 19–32)
CREATININE: 1 mg/dL (ref 0.40–1.20)
Calcium: 9.9 mg/dL (ref 8.4–10.5)
GFR: 57.12 mL/min — ABNORMAL LOW (ref >60.00)
Glucose, Bld: 113 mg/dL — ABNORMAL HIGH (ref 70–99)
POTASSIUM: 4.1 meq/L (ref 3.5–5.1)
SODIUM: 137 meq/L (ref 135–145)

## 2014-12-14 NOTE — Patient Instructions (Addendum)
Kayla Craig , Thank you for taking time to come for your Medicare Wellness Visit. I appreciate your ongoing commitment to your health goals. Please review the following plan we discussed and let me know if I can assist you in the future.   Will speak with Dr. Doug Sou about colonsocopy  Will fup on bp reading and diet; will journal when she comes back  Will fup on getting eye apt this year  Will order mammogram external  Will discuss dexa with dr. Doug Sou  Will take prevnar today    These are the goals we discussed: Goals    . Cut out extra servings     Cut back on what I eat; discussed plate method for portion  Monitor sodium; suggest monitoring food intake with bp checks for awhile        This is a list of the screening recommended for you and due dates:  Health Maintenance  Topic Date Due  . Shingles Vaccine  10/19/1997  . Colon Cancer Screening  05/08/2012  . Pneumonia vaccines (2 of 2 - PCV13) 05/08/2013  . Flu Shot  11/08/2014  . Tetanus Vaccine  05/08/2022  . DEXA scan (bone density measurement)  Completed    Fat and Cholesterol Control Diet Fat and cholesterol levels in your blood and organs are influenced by your diet. High levels of fat and cholesterol may lead to diseases of the heart, small and large blood vessels, gallbladder, liver, and pancreas. CONTROLLING FAT AND CHOLESTEROL WITH DIET Although exercise and lifestyle factors are important, your diet is key. That is because certain foods are known to raise cholesterol and others to lower it. The goal is to balance foods for their effect on cholesterol and more importantly, to replace saturated and trans fat with other types of fat, such as monounsaturated fat, polyunsaturated fat, and omega-3 fatty acids. On average, a person should consume no more than 15 to 17 g of saturated fat daily. Saturated and trans fats are considered "bad" fats, and they will raise LDL cholesterol. Saturated fats are primarily found in  animal products such as meats, butter, and cream. However, that does not mean you need to give up all your favorite foods. Today, there are good tasting, low-fat, low-cholesterol substitutes for most of the things you like to eat. Choose low-fat or nonfat alternatives. Choose round or loin cuts of red meat. These types of cuts are lowest in fat and cholesterol. Chicken (without the skin), fish, veal, and ground [place] breast are great choices. Eliminate fatty meats, such as hot dogs and salami. Even shellfish have little or no saturated fat. Have a 3 oz (85 g) portion when you eat lean meat, poultry, or fish. Trans fats are also called "partially hydrogenated oils." They are oils that have been scientifically manipulated so that they are solid at room temperature resulting in a longer shelf life and improved taste and texture of foods in which they are added. Trans fats are found in stick margarine, some tub margarines, cookies, crackers, and baked goods.  When baking and cooking, oils are a great substitute for butter. The monounsaturated oils are especially beneficial since it is believed they lower LDL and raise HDL. The oils you should avoid entirely are saturated tropical oils, such as coconut and palm.  Remember to eat a lot from food groups that are naturally free of saturated and trans fat, including fish, fruit, vegetables, beans, grains (barley, rice, couscous, bulgur wheat), and pasta (without cream sauces).  IDENTIFYING  FOODS THAT LOWER FAT AND CHOLESTEROL  Soluble fiber may lower your cholesterol. This type of fiber is found in fruits such as apples, vegetables such as broccoli, potatoes, and carrots, legumes such as beans, peas, and lentils, and grains such as barley. Foods fortified with plant sterols (phytosterol) may also lower cholesterol. You should eat at least 2 g per day of these foods for a cholesterol lowering effect.  Read package labels to identify low-saturated fats, trans fat free,  and low-fat foods at the supermarket. Select cheeses that have only 2 to 3 g saturated fat per ounce. Use a heart-healthy tub margarine that is free of trans fats or partially hydrogenated oil. When buying baked goods (cookies, crackers), avoid partially hydrogenated oils. Breads and muffins should be made from whole grains (whole-wheat or whole oat flour, instead of "flour" or "enriched flour"). Buy non-creamy canned soups with reduced salt and no added fats.  FOOD PREPARATION TECHNIQUES  Never deep-fry. If you must fry, either stir-fry, which uses very little fat, or use non-stick cooking sprays. When possible, broil, bake, or roast meats, and steam vegetables. Instead of putting butter or margarine on vegetables, use lemon and herbs, applesauce, and cinnamon (for squash and sweet potatoes). Use nonfat yogurt, salsa, and low-fat dressings for salads.  LOW-SATURATED FAT / LOW-FAT FOOD SUBSTITUTES Meats / Saturated Fat (g)  Avoid: Steak, marbled (3 oz/85 g) / 11 g  Choose: Steak, lean (3 oz/85 g) / 4 g  Avoid: Hamburger (3 oz/85 g) / 7 g  Choose: Hamburger, lean (3 oz/85 g) / 5 g  Avoid: Ham (3 oz/85 g) / 6 g  Choose: Ham, lean cut (3 oz/85 g) / 2.4 g  Avoid: Chicken, with skin, dark meat (3 oz/85 g) / 4 g  Choose: Chicken, skin removed, dark meat (3 oz/85 g) / 2 g  Avoid: Chicken, with skin, light meat (3 oz/85 g) / 2.5 g  Choose: Chicken, skin removed, light meat (3 oz/85 g) / 1 g Dairy / Saturated Fat (g)  Avoid: Whole milk (1 cup) / 5 g  Choose: Low-fat milk, 2% (1 cup) / 3 g  Choose: Low-fat milk, 1% (1 cup) / 1.5 g  Choose: Skim milk (1 cup) / 0.3 g  Avoid: Hard cheese (1 oz/28 g) / 6 g  Choose: Skim milk cheese (1 oz/28 g) / 2 to 3 g  Avoid: Cottage cheese, 4% fat (1 cup) / 6.5 g  Choose: Low-fat cottage cheese, 1% fat (1 cup) / 1.5 g  Avoid: Ice cream (1 cup) / 9 g  Choose: Sherbet (1 cup) / 2.5 g  Choose: Nonfat frozen yogurt (1 cup) / 0.3 g  Choose: Frozen  fruit bar / trace  Avoid: Whipped cream (1 tbs) / 3.5 g  Choose: Nondairy whipped topping (1 tbs) / 1 g Condiments / Saturated Fat (g)  Avoid: Mayonnaise (1 tbs) / 2 g  Choose: Low-fat mayonnaise (1 tbs) / 1 g  Avoid: Butter (1 tbs) / 7 g  Choose: Extra light margarine (1 tbs) / 1 g  Avoid: Coconut oil (1 tbs) / 11.8 g  Choose: Olive oil (1 tbs) / 1.8 g  Choose: Corn oil (1 tbs) / 1.7 g  Choose: Safflower oil (1 tbs) / 1.2 g  Choose: Sunflower oil (1 tbs) / 1.4 g  Choose: Soybean oil (1 tbs) / 2.4 g  Choose: Canola oil (1 tbs) / 1 g Document Released: 03/26/2005 Document Revised: 07/21/2012 Document Reviewed: 06/24/2013 [employer] Patient Information  2015 [employer]. This information is not intended to replace advice given to you by your health care provider. Make sure you discuss any questions you have with your health care provider.  Low-Sodium Eating Plan Sodium raises blood pressure and causes water to be held in the body. Getting less sodium from food will help lower your blood pressure, reduce any swelling, and protect your heart, liver, and kidneys. We get sodium by adding salt (sodium chloride) to food. Most of our sodium comes from canned, boxed, and frozen foods. Restaurant foods, fast foods, and pizza are also very high in sodium. Even if you take medicine to lower your blood pressure or to reduce fluid in your body, getting less sodium from your food is important. WHAT IS MY PLAN? Most people should limit their sodium intake to 2,300 mg a day. Your health care provider recommends that you limit your sodium intake to __________ a day.  WHAT DO I NEED TO KNOW ABOUT THIS EATING PLAN? For the low-sodium eating plan, you will follow these general guidelines:  Choose foods with a % Daily Value for sodium of less than 5% (as listed on the food label).   Use salt-free seasonings or herbs instead of table salt or sea salt.   Check with your health care provider or  pharmacist before using salt substitutes.   Eat fresh foods.  Eat more vegetables and fruits.  Limit canned vegetables. If you do use them, rinse them well to decrease the sodium.   Limit cheese to 1 oz (28 g) per day.   Eat lower-sodium products, often labeled as "lower sodium" or "no salt added."  Avoid foods that contain monosodium glutamate (MSG). MSG is sometimes added to [place] food and some canned foods.  Check food labels (Nutrition Facts labels) on foods to learn how much sodium is in one serving.  Eat more home-cooked food and less restaurant, buffet, and fast food.  When eating at a restaurant, ask that your food be prepared with less salt or none, if possible.  HOW DO I READ FOOD LABELS FOR SODIUM INFORMATION? The Nutrition Facts label lists the amount of sodium in one serving of the food. If you eat more than one serving, you must multiply the listed amount of sodium by the number of servings. Food labels may also identify foods as:  Sodium free--Less than 5 mg in a serving.  Very low sodium--35 mg or less in a serving.  Low sodium--140 mg or less in a serving.  Light in sodium--50% less sodium in a serving. For example, if a food that usually has 300 mg of sodium is changed to become light in sodium, it will have 150 mg of sodium.  Reduced sodium--25% less sodium in a serving. For example, if a food that usually has 400 mg of sodium is changed to reduced sodium, it will have 300 mg of sodium. WHAT FOODS CAN I EAT? Grains Low-sodium cereals, including oats, puffed wheat and rice, and shredded wheat cereals. Low-sodium crackers. Unsalted rice and pasta. Lower-sodium bread.  Vegetables Frozen or fresh vegetables. Low-sodium or reduced-sodium canned vegetables. Low-sodium or reduced-sodium tomato sauce and paste. Low-sodium or reduced-sodium tomato and vegetable juices.  Fruits Fresh, frozen, and canned fruit. Fruit juice.  Meat and Other Protein  Products Low-sodium canned tuna and salmon. Fresh or frozen meat, poultry, seafood, and fish. Lamb. Unsalted nuts. Dried beans, peas, and lentils without added salt. Unsalted canned beans. Homemade soups without salt. Eggs.  Dairy Milk. Soy  milk. Ricotta cheese. Low-sodium or reduced-sodium cheeses. Yogurt.  Condiments Fresh and dried herbs and spices. Salt-free seasonings. Onion and garlic powders. Low-sodium varieties of mustard and ketchup. Lemon juice.  Fats and Oils Reduced-sodium salad dressings. Unsalted butter.  Other Unsalted popcorn and pretzels.  The items listed above may not be a complete list of recommended foods or beverages. Contact your dietitian for more options. WHAT FOODS ARE NOT RECOMMENDED? Grains Instant hot cereals. Bread stuffing, pancake, and biscuit mixes. Croutons. Seasoned rice or pasta mixes. Noodle soup cups. Boxed or frozen macaroni and cheese. Self-rising flour. Regular salted crackers. Vegetables Regular canned vegetables. Regular canned tomato sauce and paste. Regular tomato and vegetable juices. Frozen vegetables in sauces. Salted french fries. Olives. Angie Fava. Relishes. Sauerkraut. Salsa. Meat and Other Protein Products Salted, canned, smoked, spiced, or pickled meats, seafood, or fish. Bacon, ham, sausage, hot dogs, corned beef, chipped beef, and packaged luncheon meats. Salt pork. Jerky. Pickled herring. Anchovies, regular canned tuna, and sardines. Salted nuts. Dairy Processed cheese and cheese spreads. Cheese curds. Blue cheese and cottage cheese. Buttermilk.  Condiments Onion and garlic salt, seasoned salt, table salt, and sea salt. Canned and packaged gravies. Worcestershire sauce. Tartar sauce. Barbecue sauce. Teriyaki sauce. Soy sauce, including reduced sodium. Steak sauce. Fish sauce. Oyster sauce. Cocktail sauce. Horseradish. Regular ketchup and mustard. Meat flavorings and tenderizers. Bouillon cubes. Hot sauce. Tabasco sauce.  Marinades. Taco seasonings. Relishes. Fats and Oils Regular salad dressings. Salted butter. Margarine. Ghee. Bacon fat.  Other Potato and tortilla chips. Corn chips and puffs. Salted popcorn and pretzels. Canned or dried soups. Pizza. Frozen entrees and pot pies.  The items listed above may not be a complete list of foods and beverages to avoid. Contact your dietitian for more information. Document Released: 09/15/2001 Document Revised: 03/31/2013 Document Reviewed: 01/28/2013 [facility]: [facility] Patient Information 2015 [place], [place]. This information is not intended to replace advice given to you by your health care provider. Make sure you discuss any questions you have with your health care provider.  Bone Densitometry Bone densitometry is a special X-ray that measures your bone density and can be used to help predict your risk of bone fractures. This test is used to determine bone mineral content and density to diagnose osteoporosis. Osteoporosis is the loss of bone that may cause the bone to become weak. Osteoporosis commonly occurs in women entering menopause. However, it may be found in men and in people with other diseases. PREPARATION FOR TEST No preparation necessary. WHO SHOULD BE TESTED?  All women older than 3.  Postmenopausal women (50 to 40) with risk factors for osteoporosis.  People with a previous fracture caused by normal activities.  People with a small body frame (less than 127 poundsor a body mass index [BMI] of less than 21).  People who have a parent with a hip fracture or history of osteoporosis.  People who smoke.  People who have rheumatoid arthritis.  Anyone who engages in excessive alcohol use (more than 3 drinks most days).  Women who experience early menopause. WHEN SHOULD YOU BE RETESTED? Current guidelines suggest that you should wait at least 2 years before doing a bone density test again if your first test was normal.Recent studies indicated that  women with normal bone density may be able to wait a few years before needing to repeat a bone density test. You should discuss this with your caregiver.  NORMAL FINDINGS   Normal: less than standard deviation below normal (greater than -1).  Osteopenia: 1 to  2.5 standard deviations below normal (-1 to -2.5).  Osteoporosis: greater than 2.5 standard deviations below normal (less than -2.5). Test results are reported as a "T score" and a "Z score."The T score is a number that compares your bone density with the bone density of healthy, young women.The Z score is a number that compares your bone density with the scores of women who are the same age, gender, and race.  Ranges for normal findings may vary among different laboratories and hospitals. You should always check with your doctor after having lab work or other tests done to discuss the meaning of your test results and whether your values are considered within normal limits. MEANING OF TEST  Your caregiver will go over the test results with you and discuss the importance and meaning of your results, as well as treatment options and the need for additional tests if necessary. OBTAINING THE TEST RESULTS It is your responsibility to obtain your test results. Ask the lab or department performing the test when and how you will get your results. Document Released: 04/17/2004 Document Revised: 06/18/2011 Document Reviewed: 05/10/2010 [facility] Patient Information 2015 [place], [place]. This information is not intended to replace advice given to you by your health care provider. Make sure you discuss any questions you have with your health care provider.  Fall Prevention and Home Safety Falls cause injuries and can affect all age groups. It is possible to prevent falls.  HOW TO PREVENT FALLS  Wear shoes with rubber soles that do not have an opening for your toes.  Keep the inside and outside of your house well lit.  Use night lights throughout  your home.  Remove clutter from floors.  Clean up floor spills.  Remove throw rugs or fasten them to the floor with carpet tape.  Do not place electrical cords across pathways.  Put grab bars by your tub, shower, and toilet. Do not use towel bars as grab bars.  Put handrails on both sides of the stairway. Fix loose handrails.  Do not climb on stools or stepladders, if possible.  Do not wax your floors.  Repair uneven or unsafe sidewalks, walkways, or stairs.  Keep items you use a lot within reach.  Be aware of pets.  Keep emergency numbers next to the telephone.  Put smoke detectors in your home and near bedrooms. Ask your doctor what other things you can do to prevent falls. Document Released: 01/20/2009 Document Revised: 09/25/2011 Document Reviewed: 06/26/2011 [facility] Patient Information 2015 [place], [place]. This information is not intended to replace advice given to you by your health care provider. Make sure you discuss any questions you have with your health care provider.  Health Maintenance Adopting a healthy lifestyle and getting preventive care can go a long way to promote health and wellness. Talk with your health care provider about what schedule of regular examinations is right for you. This is a good chance for you to check in with your provider about disease prevention and staying healthy. In between checkups, there are plenty of things you can do on your own. Experts have done a lot of research about which lifestyle changes and preventive measures are most likely to keep you healthy. Ask your health care provider for more information. WEIGHT AND DIET  Eat a healthy diet  Be sure to include plenty of vegetables, fruits, low-fat dairy products, and lean protein.  Do not eat a lot of foods high in solid fats, added sugars, or salt.  Get  regular exercise. This is one of the most important things you can do for your health.  Most adults should exercise for at  least 150 minutes each week. The exercise should increase your heart rate and make you sweat (moderate-intensity exercise).  Most adults should also do strengthening exercises at least twice a week. This is in addition to the moderate-intensity exercise.  Maintain a healthy weight  Body mass index (BMI) is a measurement that can be used to identify possible weight problems. It estimates body fat based on height and weight. Your health care provider can help determine your BMI and help you achieve or maintain a healthy weight.  For females 52 years of age and older:   A BMI below 18.5 is considered underweight.  A BMI of 18.5 to 24.9 is normal.  A BMI of 25 to 29.9 is considered overweight.  A BMI of 30 and above is considered obese.  Watch levels of cholesterol and blood lipids  You should start having your blood tested for lipids and cholesterol at 77 years of age, then have this test every 5 years.  You may need to have your cholesterol levels checked more often if:  Your lipid or cholesterol levels are high.  You are older than 77 years of age.  You are at high risk for heart disease.  CANCER SCREENING   Lung Cancer  Lung cancer screening is recommended for adults 27-56 years old who are at high risk for lung cancer because of a history of smoking.  A yearly low-dose CT scan of the lungs is recommended for people who:  Currently smoke.  Have quit within the past 15 years.  Have at least a 30-pack-year history of smoking. A pack year is smoking an average of one pack of cigarettes a day for 1 year.  Yearly screening should continue until it has been 15 years since you quit.  Yearly screening should stop if you develop a health problem that would prevent you from having lung cancer treatment.  Breast Cancer  Practice breast self-awareness. This means understanding how your breasts normally appear and feel.  It also means doing regular breast self-exams. Let your  health care provider know about any changes, no matter how small.  If you are in your 20s or 30s, you should have a clinical breast exam (CBE) by a health care provider every 1-3 years as part of a regular health exam.  If you are 38 or older, have a CBE every year. Also consider having a breast X-ray (mammogram) every year.  If you have a family history of breast cancer, talk to your health care provider about genetic screening.  If you are at high risk for breast cancer, talk to your health care provider about having an MRI and a mammogram every year.  Breast cancer gene (BRCA) assessment is recommended for women who have family members with BRCA-related cancers. BRCA-related cancers include:  Breast.  Ovarian.  Tubal.  Peritoneal cancers.  Results of the assessment will determine the need for genetic counseling and BRCA1 and BRCA2 testing. Cervical Cancer Routine pelvic examinations to screen for cervical cancer are no longer recommended for nonpregnant women who are considered low risk for cancer of the pelvic organs (ovaries, uterus, and vagina) and who do not have symptoms. A pelvic examination may be necessary if you have symptoms including those associated with pelvic infections. Ask your health care provider if a screening pelvic exam is right for you.   The  Pap test is the screening test for cervical cancer for women who are considered at risk.  If you had a hysterectomy for a problem that was not cancer or a condition that could lead to cancer, then you no longer need Pap tests.  If you are older than 65 years, and you have had normal Pap tests for the past 10 years, you no longer need to have Pap tests.  If you have had past treatment for cervical cancer or a condition that could lead to cancer, you need Pap tests and screening for cancer for at least 20 years after your treatment.  If you no longer get a Pap test, assess your risk factors if they change (such as having a  new sexual partner). This can affect whether you should start being screened again.  Some women have medical problems that increase their chance of getting cervical cancer. If this is the case for you, your health care provider may recommend more frequent screening and Pap tests.  The human papillomavirus (HPV) test is another test that may be used for cervical cancer screening. The HPV test looks for the virus that can cause cell changes in the cervix. The cells collected during the Pap test can be tested for HPV.  The HPV test can be used to screen women 70 years of age and older. Getting tested for HPV can extend the interval between normal Pap tests from three to five years.  An HPV test also should be used to screen women of any age who have unclear Pap test results.  After 77 years of age, women should have HPV testing as often as Pap tests.  Colorectal Cancer  This type of cancer can be detected and often prevented.  Routine colorectal cancer screening usually begins at 77 years of age and continues through 77 years of age.  Your health care provider may recommend screening at an earlier age if you have risk factors for colon cancer.  Your health care provider may also recommend using home test kits to check for hidden blood in the stool.  A small camera at the end of a tube can be used to examine your colon directly (sigmoidoscopy or colonoscopy). This is done to check for the earliest forms of colorectal cancer.  Routine screening usually begins at age 71.  Direct examination of the colon should be repeated every 5-10 years through 77 years of age. However, you may need to be screened more often if early forms of precancerous polyps or small growths are found. Skin Cancer  Check your skin from head to toe regularly.  Tell your health care provider about any new moles or changes in moles, especially if there is a change in a mole's shape or color.  Also tell your health care  provider if you have a mole that is larger than the size of a pencil eraser.  Always use sunscreen. Apply sunscreen liberally and repeatedly throughout the day.  Protect yourself by wearing long sleeves, pants, a wide-brimmed hat, and sunglasses whenever you are outside. HEART DISEASE, DIABETES, AND HIGH BLOOD PRESSURE   Have your blood pressure checked at least every 1-2 years. High blood pressure causes heart disease and increases the risk of stroke.  If you are between 78 years and 27 years old, ask your health care provider if you should take aspirin to prevent strokes.  Have regular diabetes screenings. This involves taking a blood sample to check your fasting blood sugar level.  If you are at a normal weight and have a low risk for diabetes, have this test once every three years after 77 years of age.  If you are overweight and have a high risk for diabetes, consider being tested at a younger age or more often. PREVENTING INFECTION  Hepatitis B  If you have a higher risk for hepatitis B, you should be screened for this virus. You are considered at high risk for hepatitis B if:  You were born in a country where hepatitis B is common. Ask your health care provider which countries are considered high risk.  Your parents were born in a high-risk country, and you have not been immunized against hepatitis B (hepatitis B vaccine).  You have HIV or AIDS.  You use needles to inject street drugs.  You live with someone who has hepatitis B.  You have had sex with someone who has hepatitis B.  You get hemodialysis treatment.  You take certain medicines for conditions, including cancer, organ transplantation, and autoimmune conditions. Hepatitis C  Blood testing is recommended for:  Everyone born from [birth year] through 1963.  Anyone with known risk factors for hepatitis C. Sexually transmitted infections (STIs)  You should be screened for sexually transmitted infections (STIs)  including gonorrhea and chlamydia if:  You are sexually active and are younger than 77 years of age.  You are older than 77 years of age and your health care provider tells you that you are at risk for this type of infection.  Your sexual activity has changed since you were last screened and you are at an increased risk for chlamydia or gonorrhea. Ask your health care provider if you are at risk.  If you do not have HIV, but are at risk, it may be recommended that you take a prescription medicine daily to prevent HIV infection. This is called pre-exposure prophylaxis (PrEP). You are considered at risk if:  You are sexually active and do not regularly use condoms or know the HIV status of your partner(s).  You take drugs by injection.  You are sexually active with a partner who has HIV. Talk with your health care provider about whether you are at high risk of being infected with HIV. If you choose to begin PrEP, you should first be tested for HIV. You should then be tested every 3 months for as long as you are taking PrEP.  PREGNANCY   If you are premenopausal and you may become pregnant, ask your health care provider about preconception counseling.  If you may become pregnant, take 400 to 800 micrograms (mcg) of folic acid every day.  If you want to prevent pregnancy, talk to your health care provider about birth control (contraception). OSTEOPOROSIS AND MENOPAUSE   Osteoporosis is a disease in which the bones lose minerals and strength with aging. This can result in serious bone fractures. Your risk for osteoporosis can be identified using a bone density scan.  If you are 18 years of age or older, or if you are at risk for osteoporosis and fractures, ask your health care provider if you should be screened.  Ask your health care provider whether you should take a calcium or vitamin D supplement to lower your risk for osteoporosis.  Menopause may have certain physical symptoms and  risks.  Hormone replacement therapy may reduce some of these symptoms and risks. Talk to your health care provider about whether hormone replacement therapy is right for you.  HOME CARE INSTRUCTIONS  Schedule regular health, dental, and eye exams.  Stay current with your immunizations.   Do not use any tobacco products including cigarettes, chewing tobacco, or electronic cigarettes.  If you are pregnant, do not drink alcohol.  If you are breastfeeding, limit how much and how often you drink alcohol.  Limit alcohol intake to no more than 1 drink per day for nonpregnant women. One drink equals 12 ounces of beer, 5 ounces of wine, or 1 ounces of hard liquor.  Do not use street drugs.  Do not share needles.  Ask your health care provider for help if you need support or information about quitting drugs.  Tell your health care provider if you often feel depressed.  Tell your health care provider if you have ever been abused or do not feel safe at home. Document Released: 10/09/2010 Document Revised: 08/10/2013 Document Reviewed: 02/25/2013 [facility] Patient Information 2015 [place], [place]. This information is not intended to replace advice given to you by your health care provider. Make sure you discuss any questions you have with your health care provider.

## 2014-12-14 NOTE — Telephone Encounter (Signed)
Please inform patient that her kidney function is okay it places her in Stage 3 kidney disease. No treatment is needed for this but to control her blood pressure. Follow up in 2 weeks for a nurse visit to check her blood pressure.

## 2014-12-14 NOTE — Progress Notes (Signed)
Medical screening examination/treatment/procedure(s) were performed by non-physician practitioner and as supervising physician I was immediately available for consultation/collaboration. I agree with above. Kollar, Elizabeth A, MD   

## 2014-12-14 NOTE — Progress Notes (Signed)
Subjective:   Kayla Craig is a 79 y.o. female who presents for Medicare Annual (Subsequent) preventive examination.  Review of Systems:  HRA assessment completed during visit;  Patient is here for Annual Wellness Assessment The Patient was informed that this wellness visit is to identify risk and educate on how to reduce risk for increase disease through lifestyle changes.   ROS deferred to CPE exam with physician ER 07/2014 Sarcoidosis, HTN, and hypertriglyceridemia   Metabolic risk 3/5  Cho 305; Trig 903; HDL 37; LDL 110 Bp 179 /103; 182/ 86; now running 117/66/ Discussed Metabolic syndrome and risk for future CVD event or DM  BMI: discussed 31.1 / Option for MNT reviewed;  Diet; has dinners where she lives; love sausage and bacon; but stopped x 2 week; Eats a lot of fruit; Now eat oatmeal, grits; eggs and toast; Lunch; sometimes don't eat lunch; Supper; go out to eat; went to [employer] yesterday Exercise; Used to walk; had knee surgery; can't remember the year but has chipped bones removed. Over to see Dr. 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  Does have equipment she can use at home to work with weights; She does love yard work; States she does stay busy in her home or yard;   Careers adviser reviewed for the home; on first floor;  including removal of clutter; clear paths through the home, eliminating clutter, railing as needed; bathroom safety; community safety; smoke detectors and firearms safety as well as sun protection reviewed  No Driving accidents and uses seatbelt  Stressors; lost a friend at apt's complex and just found out she expired this am; Feels this is why her BP is elevated today.  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 Debby Bud told her to take fish oil 1000 x 2 q d.   Fall assessment; no  Gait assessment; normal at present  Functional changes;  no changes in ADL's or IADl in the last year  Urinary or fecal incontinence reviewed/ no issues with bladder   Counseling: Colonoscopy; due 2014; did not have colonoscopy in 2014 as did not have a good experience with the last one; Did not fup on  referral sent to GI 01/11/2014/ postponed; now aged out at 70 Will discuss with Dr. Dorise Hiss during her apt next week.  Can discuss colo-guard of stool check for occult blood/ Would have virtual CT; not referred to insurance carrier as Medicare generally had not covered Virtual Colonoscopy CT without MN. Will discuss virtual CT with Dr. Dorise Hiss   EKG 12/27/2012 Dexa scan; 03/2011 AP spine t score -0.8/ l femur -1.4/ right femur -1.2/Due 11/ 2016 Mammogram No mammographic evidence of malignancy. 03/2012; to repeat 2015; Order placed for mammogram; external at place of her choice; ([address]"   Hearing: 2000 hz in right; 4000 hz in the left Ophthalmology exam; has been x 1 year; needs  new eye doctor; Dr. Hazle Quant does not take her insurance; Will call doctors on [place] to try and get an apt this year; Dentist regularly; Dr. Marciano Sequin   Immunizations Due  Zoster due/ will check on Part D coverage; could not afford on Medical side Flu due / will post pone until her visit next week with dr. Deliah Goody due/ agreed to have the prevnar today   Current Care Team reviewed and updated   Cardiac Risk Factors include: advanced age (>14men, >20 women);dyslipidemia;family history of premature cardiovascular  disease;hypertension;obesity (BMI >30kg/m2)     Objective:     Vitals: BP 180/88 mmHg  Ht 5\' 5"  (1.651 m)  Wt 187 lb 4 oz (84.936 kg)  BMI 31.16 kg/m2  Tobacco History  Smoking status  . Never Smoker   Smokeless tobacco  . Never Used     Counseling given: Yes   Past Medical History  Diagnosis Date  . Goiter   . B12 deficiency   . Vitamin D deficiency   . TMJ (dislocation of temporomandibular joint)     Right Side  . 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 Back pain     Chronic  . Hypertension    Past Surgical History  Procedure Laterality Date  . Knee surgery      Right Arthroscopy '05 Dr Charlann Boxer  . Tubal ligation    . Thyroidectomy      August '09 for goiter (Dr Jearld Fenton)  . Eye surgery  2010    cataract surgery with implants   Family History  Problem Relation Age of Onset  . Cancer Mother   . Heart disease Father     CAD/MI  . Heart disease Brother     CAD/MI  . Diabetes Neg Hx   . Heart disease Brother     CAD/MI  . Cancer Brother    History  Sexual Activity  . Sexual Activity: No    Outpatient Encounter Prescriptions as of 12/14/2014  Medication Sig  . aspirin 81 MG EC tablet Take 81 mg by mouth daily.    Marland Kitchen BYSTOLIC 10 MG tablet TAKE ONE TABLET BY MOUTH ONCE DAILY  . Calcium Carbonate-Vit D-Min (CALCIUM 1200 PO) Take by mouth.  . Cholecalciferol (VITAMIN D-3) 1000 UNITS CAPS Take 1 capsule by mouth daily.  . hydrochlorothiazide (HYDRODIURIL) 25 MG tablet Take 1 tablet (25 mg total) by mouth daily.  Marland Kitchen levothyroxine (SYNTHROID, LEVOTHROID) 50 MCG tablet TAKE ONE TABLET BY MOUTH ONCE DAILY BEFORE BREAKFAST  . losartan (COZAAR) 100 MG tablet TAKE ONE TABLET BY MOUTH ONCE DAILY  . Omega-3 Fatty Acids (FISH OIL) 1000 MG CAPS Take 1,000 mg by mouth 2 (two) times daily.  . pantoprazole (PROTONIX) 40 MG tablet TAKE ONE TABLET BY MOUTH ONCE DAILY  . vitamin B-12 (CYANOCOBALAMIN) 1000 MCG tablet Take 1,000 mcg by mouth daily.    Marland Kitchen omega-3 acid  ethyl esters (LOVAZA) 1 G capsule Take 2 capsules (2 g total) by mouth 2 (two) times daily. (Patient not taking: Reported on 12/14/2014)   No facility-administered encounter medications on file as of 12/14/2014.    Activities of Daily Living In your present state of health, do you have any difficulty performing the following activities: 12/14/2014  Hearing? N  Vision? N  Difficulty concentrating or making decisions? N  Walking or climbing stairs? N  Dressing or bathing? N  Doing errands, shopping? N  Preparing Food and eating ? N  Using the  Toilet? N  In the past six months, have you accidently leaked urine? N  Do you have problems with loss of bowel control? N  Managing your Medications? N  Managing your Finances? N  Housekeeping or managing your Housekeeping? N    Patient Care Team: Judie Bonus, MD as PCP - General (Internal Medicine) Nelson Chimes, MD (Ophthalmology) Durene Romans, MD (Orthopedic Surgery)    Assessment:     Assessment   Today patient counseled on age appropriate routine health concerns for screening and prevention, each reviewed and up to date or declined. Immunizations reviewed. Prevnar today; Flu next week. Labs deferred for CPE or medical fup by MD.  Risk factors for depression reviewed and negative. Hearing function and visual acuity are intact. ADLs screened and addressed as needed. Functional ability and level of safety reviewed and appropriate. Educated on memory loss and AD8 score 0/  ; Education, counseling and referrals performed based on assessed risks today. Patient provided with a copy of personalized plan for preventive services and due dates  Hypertriglyceridemia;  Educated on risk of high triglyceride level including pancreatitis; Educated on monitoring cholesterol/ refer to MNT if the patient is willing for assistance with diet modifications Lavaza expired 05/2013; States she has been complaint on fish oil 2000 u pre day   Lifestyle  modifications to reduce risk for DM  BMI obese range; risk for diabetes reviewed with other risk factors;  12/27/12 glucose 115; Down on 10/5 to 71 Educated on low HDL; abdominal weight; high trig; Given information on low fat diet and understanding triglyceride number  HTN; changed made in 01/2014 HCTZ 12.5; increased to 25 on 11/30/14; continue losartan and bystolic; Had blood draw this am for BMP  BP remains elevated; will have nurse at apt complex check it and will attempt to dairy diet as risk that medicine will need to be increased; Educated on monitoring sodium; Discussed weight loss; Agreed to "cut back" on overall intake; Watch sodium;   Metoboic syndrome reviewed and given information on risk; HDL 37; Waist circumference; BMI; HTN  FALL PREVENTION Educated on prevention falls; Exercise, toning and strengthening; Balance exercises  Wear Comfortable shoes; Vision checks; home modifications for safety  HEPATITIS SCREEN reviewed; no risk identified  TOBACCO/ETOH or other DRUG use was negative   EXERCISE / DIET RECOMMENDATIONS The patient agrees to monitor her diet; Check BP prior to next apt. Likes to work outside but [employer] no motivation for exercise at this time  Barriers to successful management: None noted     Exercise Activities and Dietary recommendations Current Exercise Habits:: Home exercise routine (works in the year; flowers;is now doing stretching exercise; works and stays busy)  Goals    . Cut out extra servings     Cut back on what I eat; discussed plate method for portion  Monitor sodium; suggest monitoring food intake with bp checks for awhile       Fall Risk Fall Risk  12/14/2014  Falls in the past year? No   Depression Screen PHQ 2/9 Scores 12/14/2014  PHQ - 2 Score 0     Cognitive Testing MMSE - Mini Mental State Exam 12/14/2014  Not completed: (No Data)    Immunization History  Administered Date(s) Administered  . Influenza Whole 01/31/2009,  02/28/2012  . Influenza,inj,Quad PF,36+ Mos 01/11/2014  . Influenza-Unspecified 01/29/2013  . Pneumococcal Conjugate-13 12/14/2014  . Pneumococcal Polysaccharide-23 05/08/2012  . Tetanus 05/08/2012   Screening Tests Health Maintenance  Topic Date Due  .  ZOSTAVAX  10/19/1997  . COLONOSCOPY  05/08/2012  . INFLUENZA VACCINE  11/08/2014  . TETANUS/TDAP  05/08/2022  . DEXA SCAN  Completed  . PNA vac Low Risk Adult  Completed  /    Plan:  Will speak with Dr. Dorise Hiss about colonsocopy  Will fup on bp reading and diet; will journal when she comes back next week  Will fup on getting eye apt this year  Will order mammogram external;   Will discuss dexa with dr. Dorise Hiss  Will take prevnar today  Will take flu shot at her CPE 9/12 with Dr. Dorise Hiss;    During the course of the visit the patient was educated and counseled about the following appropriate screening and preventive services:   Vaccines to include Pneumoccal, Influenza, Hepatitis B, Td, Zostavax, HCV/ reviewed  Electrocardiogram 12/2012  Cardiovascular Disease/ risk discussed  Colorectal cancer screening/ declined but will discuss virtual CT or other/no sysmptoms  Bone density screening/ will discuss plan with Dr Dorise Hiss  Diabetes screening/ A1c 6.1;   Glaucoma screening/  Eye exam to be done  Mammography[facility] [address]  Nutrition counseling / reviewed; will try to "cut back".  Patient Instructions (the written plan) was given to the patient.   Montine Circle, RN  12/14/2014

## 2014-12-15 NOTE — Telephone Encounter (Signed)
Pt aware of results 

## 2014-12-15 NOTE — Telephone Encounter (Signed)
LVM for pt to call back.

## 2014-12-15 NOTE — Telephone Encounter (Signed)
Pt returned your call.  

## 2014-12-20 ENCOUNTER — Encounter: Payer: Self-pay | Admitting: Internal Medicine

## 2014-12-20 ENCOUNTER — Other Ambulatory Visit (INDEPENDENT_AMBULATORY_CARE_PROVIDER_SITE_OTHER): Payer: PPO

## 2014-12-20 ENCOUNTER — Ambulatory Visit (INDEPENDENT_AMBULATORY_CARE_PROVIDER_SITE_OTHER): Payer: PPO | Admitting: Internal Medicine

## 2014-12-20 VITALS — BP 138/82 | HR 64 | Temp 98.2°F | Resp 14 | Ht 65.0 in | Wt 186.1 lb

## 2014-12-20 DIAGNOSIS — E039 Hypothyroidism, unspecified: Secondary | ICD-10-CM

## 2014-12-20 DIAGNOSIS — I10 Essential (primary) hypertension: Secondary | ICD-10-CM

## 2014-12-20 DIAGNOSIS — E538 Deficiency of other specified B group vitamins: Secondary | ICD-10-CM

## 2014-12-20 DIAGNOSIS — R7989 Other specified abnormal findings of blood chemistry: Secondary | ICD-10-CM

## 2014-12-20 DIAGNOSIS — E785 Hyperlipidemia, unspecified: Secondary | ICD-10-CM | POA: Diagnosis not present

## 2014-12-20 DIAGNOSIS — E781 Pure hyperglyceridemia: Secondary | ICD-10-CM

## 2014-12-20 LAB — LIPID PANEL
Cholesterol: 261 mg/dL — ABNORMAL HIGH (ref 0–200)
HDL: 36.3 mg/dL — AB (ref >39.00)
TRIGLYCERIDES: 850 mg/dL — AB (ref 0.0–149.0)
Total CHOL/HDL Ratio: 7

## 2014-12-20 LAB — TSH: TSH: 2.54 u[IU]/mL (ref 0.35–4.50)

## 2014-12-20 LAB — VITAMIN B12: VITAMIN B 12: 215 pg/mL (ref 211–911)

## 2014-12-20 LAB — LDL CHOLESTEROL, DIRECT: LDL DIRECT: 90 mg/dL

## 2014-12-20 NOTE — Assessment & Plan Note (Signed)
Taking synthroid 50 mcg daily, checking TSH and adjust as needed.  

## 2014-12-20 NOTE — Progress Notes (Signed)
   Subjective:    Patient ID: Kayla Craig, female    DOB: 21-Jun-1935, 79 y.o.   MRN: [id number]  HPI The patient is a 79 YO female coming in for follow up of her medical problems. She has hypertension (uncontrolled, recent dose adjustment of her hctz, complicated by CKD stage 3, on bystolic and hctz and losartan) her thyroid (controlled on levothyroxine, no complications, no side effects or new symptoms), her B12 deficiency (taking oral replacement daily, no numbness or neuropathy). No new complaints. Some joint pains which are improving with stretching.   Review of Systems  Constitutional: Negative for fever, activity change, appetite change and fatigue.  HENT: Negative.   Eyes: Negative.   Respiratory: Negative for cough, chest tightness, shortness of breath and wheezing.   Cardiovascular: Negative for chest pain, palpitations and leg swelling.  Gastrointestinal: Negative for nausea, abdominal pain, diarrhea, constipation and abdominal distention.  Musculoskeletal: Negative for myalgias and arthralgias.  Skin: Negative.   Neurological: Negative for dizziness, weakness, light-headedness, numbness and headaches.      Objective:   Physical Exam  Constitutional: She is oriented to person, place, and time. She appears well-developed and well-nourished.  HENT:  Head: Normocephalic and atraumatic.  Eyes: EOM are normal.  Neck: Normal range of motion.  Cardiovascular: Normal rate and regular rhythm.   No murmur heard. Pulmonary/Chest: Effort normal and breath sounds normal. No respiratory distress. She has no wheezes. She has no rales.  Abdominal: Soft. Bowel sounds are normal. She exhibits no distension. There is no tenderness. There is no rebound.  Neurological: She is alert and oriented to person, place, and time. Coordination normal.  Skin: Skin is warm and dry.   Filed Vitals:   12/20/14 1322  BP: 152/78  Pulse: 64  Temp: 98.2 F (36.8 C)  TempSrc: Oral  Resp: 14  Height: 5'  5" (1.651 m)  Weight: 186 lb 1.9 oz (84.423 kg)  SpO2: 96%      Assessment & Plan:

## 2014-12-20 NOTE — Assessment & Plan Note (Signed)
On oral replacement and levels not checked in some time. Checking B12 level today and adjust as needed. No signs of neuropathy.

## 2014-12-20 NOTE — Patient Instructions (Signed)
We are going to check the blood work today and call you back with the results.  We have given you the flu shot today.   Come back in about 6 months to check on the blood pressure.   Exercise to Stay Healthy Exercise helps you become and stay healthy. EXERCISE IDEAS AND TIPS Choose exercises that:  You enjoy.  Fit into your day. You do not need to exercise really hard to be healthy. You can do exercises at a slow or medium level and stay healthy. You can:  Stretch before and after working out.  Try yoga, Pilates, or tai chi.  Lift weights.  Walk fast, swim, jog, run, climb stairs, bicycle, dance, or rollerskate.  Take aerobic classes. Exercises that burn about 150 calories:  Running 1  miles in 15 minutes.  Playing volleyball for 45 to 60 minutes.  Washing and waxing a car for 45 to 60 minutes.  Playing touch football for 45 minutes.  Walking 1  miles in 35 minutes.  Pushing a stroller 1  miles in 30 minutes.  Playing basketball for 30 minutes.  Raking leaves for 30 minutes.  Bicycling 5 miles in 30 minutes.  Walking 2 miles in 30 minutes.  Dancing for 30 minutes.  Shoveling snow for 15 minutes.  Swimming laps for 20 minutes.  Walking up stairs for 15 minutes.  Bicycling 4 miles in 15 minutes.  Gardening for 30 to 45 minutes.  Jumping rope for 15 minutes.  Washing windows or floors for 45 to 60 minutes. Document Released: 04/28/2010 Document Revised: 06/18/2011 Document Reviewed: 04/28/2010 [facility] Patient Information 2015 [place], [place]. This information is not intended to replace advice given to you by your health care provider. Make sure you discuss any questions you have with your health care provider.

## 2014-12-20 NOTE — Assessment & Plan Note (Signed)
Taking omega-3 FA, checking lipid panel today. Adjust as needed.

## 2014-12-20 NOTE — Progress Notes (Signed)
Pre visit review using our clinic review tool, if applicable. No additional management support is needed unless otherwise documented below in the visit note. 

## 2014-12-20 NOTE — Assessment & Plan Note (Signed)
Complicated by CKD stage 3, currently taking hctz, bystolic, losartan. Doing better on the higher dose of hctz. BMP checked last week and no need for adjustment of regimen. Still mildly above goal.

## 2015-01-03 ENCOUNTER — Other Ambulatory Visit: Payer: Self-pay | Admitting: Internal Medicine

## 2015-01-05 ENCOUNTER — Telehealth: Payer: Self-pay | Admitting: Internal Medicine

## 2015-01-05 NOTE — Telephone Encounter (Signed)
Patient called re: lab results. She was upset because she states that she has called 3 times and not heard back. She states that she takes fish oil for her cholesterol.   She is available on tuesdays and fridays if we would call her on those days.

## 2015-01-20 ENCOUNTER — Other Ambulatory Visit: Payer: Self-pay | Admitting: Internal Medicine

## 2015-02-08 ENCOUNTER — Other Ambulatory Visit: Payer: Self-pay | Admitting: Family

## 2015-02-08 ENCOUNTER — Other Ambulatory Visit: Payer: Self-pay | Admitting: Internal Medicine

## 2015-03-04 ENCOUNTER — Other Ambulatory Visit: Payer: Self-pay | Admitting: Internal Medicine

## 2015-03-10 ENCOUNTER — Other Ambulatory Visit: Payer: Self-pay | Admitting: Internal Medicine

## 2015-03-30 ENCOUNTER — Other Ambulatory Visit: Payer: Self-pay | Admitting: Internal Medicine

## 2015-04-08 ENCOUNTER — Other Ambulatory Visit: Payer: Self-pay | Admitting: Internal Medicine

## 2015-04-15 ENCOUNTER — Other Ambulatory Visit: Payer: Self-pay | Admitting: Internal Medicine

## 2015-05-09 ENCOUNTER — Other Ambulatory Visit: Payer: Self-pay | Admitting: Internal Medicine

## 2015-05-13 ENCOUNTER — Other Ambulatory Visit: Payer: Self-pay

## 2015-05-13 DIAGNOSIS — Z1231 Encounter for screening mammogram for malignant neoplasm of breast: Secondary | ICD-10-CM

## 2015-05-13 NOTE — Addendum Note (Signed)
Addended byMontine Circle on: 05/13/2015 11:00 AM   Modules accepted: Orders

## 2015-05-19 ENCOUNTER — Other Ambulatory Visit: Payer: Self-pay | Admitting: Internal Medicine

## 2015-05-27 ENCOUNTER — Ambulatory Visit
Admission: RE | Admit: 2015-05-27 | Discharge: 2015-05-27 | Disposition: A | Discharge status: Home / Self Care | Payer: PPO | Source: Ambulatory Visit

## 2015-05-27 DIAGNOSIS — Z1231 Encounter for screening mammogram for malignant neoplasm of breast: Secondary | ICD-10-CM | POA: Diagnosis not present

## 2015-06-06 ENCOUNTER — Ambulatory Visit (INDEPENDENT_AMBULATORY_CARE_PROVIDER_SITE_OTHER): Payer: PPO | Admitting: Family Medicine

## 2015-06-06 VITALS — BP 126/82 | HR 73 | Temp 99.0°F | Resp 18 | Ht 64.25 in | Wt 183.2 lb

## 2015-06-06 DIAGNOSIS — H9203 Otalgia, bilateral: Secondary | ICD-10-CM | POA: Diagnosis not present

## 2015-06-06 DIAGNOSIS — R05 Cough: Secondary | ICD-10-CM | POA: Diagnosis not present

## 2015-06-06 DIAGNOSIS — J029 Acute pharyngitis, unspecified: Secondary | ICD-10-CM | POA: Diagnosis not present

## 2015-06-06 DIAGNOSIS — J069 Acute upper respiratory infection, unspecified: Secondary | ICD-10-CM

## 2015-06-06 DIAGNOSIS — R519 Headache, unspecified: Secondary | ICD-10-CM

## 2015-06-06 DIAGNOSIS — R51 Headache: Secondary | ICD-10-CM

## 2015-06-06 DIAGNOSIS — R059 Cough, unspecified: Secondary | ICD-10-CM

## 2015-06-06 LAB — POCT RAPID STREP A (OFFICE): Rapid Strep A Screen: NEGATIVE

## 2015-06-06 MED ORDER — AZITHROMYCIN 250 MG PO TABS: ORAL_TABLET | ORAL | Status: DC

## 2015-06-06 MED ORDER — BENZONATATE 100 MG PO CAPS: 200.0000 mg | ORAL_CAPSULE | Freq: Two times a day (BID) | ORAL | Status: DC | PRN

## 2015-06-06 NOTE — Progress Notes (Signed)
 Chief Complaint:  Chief Complaint  Patient presents with  . Sore Throat    x this weekend  . Cough    x this weekend  . runny nose    x this weekend  . ears feel itchy    x this weekend    HPI: Kayla Craig is a 80 y.o. female who reports to [facility] today complaining of sore throat, cough at night and runny, is coughing up mostly clear sputum , some yellow, no fevers or chills. No sick  Flu vaccine given this year, no msk aches or pain, SHe just had some headache with some releif with advil.    Past Medical History  Diagnosis Date  . Goiter   . B12 deficiency   . Vitamin D deficiency   . TMJ (dislocation of temporomandibular joint)     Right Side  . 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 Back pain     Chronic  . Hypertension    Past Surgical History  Procedure Laterality Date  . Knee surgery      Right Arthroscopy '05 Dr Charlann Boxer  . Tubal ligation    . Thyroidectomy      August '09 for goiter (Dr Jearld Fenton)  . Eye surgery  2008/08/31    cataract surgery with implants   Social History   Social History  . Marital Status: Single    Spouse Name: N/A  . Number of Children: 2  . Years of Education: 12   Occupational History  . retired    Social History Main Topics  . Smoking status: Never Smoker   . Smokeless tobacco: Never Used  . Alcohol Use: No  . Drug Use: None  . Sexual Activity: No   Other Topics Concern  . None   Social History Narrative   [place]. [place]. 2 sons- 08/31/2060 (died lung cancer Aug 09, 2022), Sep 01, 2067; 3 grandchildren. 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 End of life issues: patient would want CPR, DNI, not to be maintained in a vegative state. Laymen's guide packet provided (july '11)   Family History  Problem Relation Age of Onset  . Cancer Mother   . Heart disease Father     CAD/MI  . Heart disease Brother     CAD/MI  . Diabetes Neg Hx   .  Heart disease Brother     CAD/MI  . Cancer Brother    Allergies  Allergen Reactions  . Codeine Nausea And Vomiting    Dizzy   Prior to Admission medications   Medication Sig Start Date End Date Taking? Authorizing Provider  aspirin 81 MG EC tablet Take 81 mg by mouth daily.     Yes Historical Provider, MD  BYSTOLIC 10 MG tablet TAKE ONE TABLET BY MOUTH ONCE DAILY 05/19/15  Yes Myrlene Broker, MD  Calcium Carbonate-Vit D-Min (CALCIUM 1200 PO) Take by mouth.   Yes Historical Provider, MD  Cholecalciferol (VITAMIN D-3) 1000 UNITS CAPS Take 1 capsule by mouth daily.   Yes Historical Provider, MD  hydrochlorothiazide (HYDRODIURIL) 25 MG tablet TAKE ONE TABLET BY MOUTH ONCE DAILY 02/08/15  Yes Myrlene Broker, MD  levothyroxine (SYNTHROID, LEVOTHROID) 50 MCG tablet TAKE ONE TABLET BY MOUTH ONCE DAILY BEFORE BREAKFAST 03/10/15  Yes Myrlene Broker, MD  losartan (COZAAR) 100 MG tablet TAKE ONE TABLET BY MOUTH ONCE DAILY 04/08/15  Yes Lanora Manis  Alison Murray, MD  losartan (COZAAR) 100 MG tablet TAKE ONE TABLET BY MOUTH ONCE DAILY 04/12/15  Yes Myrlene Broker, MD  Omega-3 Fatty Acids (FISH OIL) 1000 MG CAPS Take 1,000 mg by mouth 2 (two) times daily.   Yes Historical Provider, MD  pantoprazole (PROTONIX) 40 MG tablet Take 1 tablet (40 mg total) by mouth daily. 05/09/15  Yes Myrlene Broker, MD  vitamin B-12 (CYANOCOBALAMIN) 1000 MCG tablet Take 1,000 mcg by mouth daily.     Yes Historical Provider, MD  omega-3 acid ethyl esters (LOVAZA) 1 G capsule Take 2 capsules (2 g total) by mouth 2 (two) times daily. Patient not taking: Reported on 12/14/2014 05/10/12   Jacques Navy, MD     ROS: The patient denies fevers, chills, night sweats, unintentional weight loss, chest pain, palpitations, wheezing, dyspnea on exertion, nausea, vomiting, abdominal pain, dysuria, hematuria, melena, numbness, weakness, or tingling.   All other systems have been reviewed and were otherwise negative with  the exception of those mentioned in the HPI and as above.    PHYSICAL EXAM: Filed Vitals:   06/06/15 1257  BP: 126/82  Pulse: 73  Temp: 99 F (37.2 C)  Resp: 18   Body mass index is 31.2 kg/(m^2).   General: Alert, no acute distress HEENT:  Normocephalic, atraumatic, oropharynx patent. EOMI, PERRLA Erythematous throat, no exudates, TM normal, +/- sinus tenderness, + erythematous/boggy nasal mucosa Cardiovascular:  Regular rate and rhythm, no rubs murmurs or gallops.  No Carotid bruits, radial pulse intact. No pedal edema.  Respiratory: Clear to auscultation bilaterally.  No wheezes, rales, or rhonchi.  No cyanosis, no use of accessory musculature Abdominal: No organomegaly, abdomen is soft and non-tender, positive bowel sounds. No masses. Skin: No rashes. Neurologic: Facial musculature symmetric. Psychiatric: Patient acts appropriately throughout our interaction. Lymphatic: No cervical or submandibular lymphadenopathy Musculoskeletal: Gait intact. No edema, tenderness   LABS: Results for orders placed or performed in visit on 12/20/14  TSH  Result Value Ref Range   TSH 2.54 0.35 - 4.50 uIU/mL  Lipid Profile  Result Value Ref Range   Cholesterol 261 (H) 0 - 200 mg/dL   Triglycerides 161.0 (H) 0.0 - 149.0 mg/dL   HDL 96.04 (L) >54.09 mg/dL   Total CHOL/HDL Ratio 7   B12  Result Value Ref Range   Vitamin B-12 215 211 - 911 pg/mL  LDL cholesterol, direct  Result Value Ref Range   Direct LDL 90.0 mg/dL     EKG/XRAY:   Primary read interpreted by Dr. Conley Rolls at [facility].   ASSESSMENT/PLAN: Encounter Diagnoses  Name Primary?  . Acute upper respiratory infection Yes  . Sinus headache   . Otalgia, bilateral   . Cough   . Acute pharyngitis, unspecified pharyngitis type    Tessalon perles Will give z pack if no improvement then take if no sxs improvement Precautions given  Fu prn   Gross sideeffects, risk and benefits, and alternatives of medications d/w patient. Patient  is aware that all medications have potential sideeffects and we are unable to predict every sideeffect or drug-drug interaction that may occur.    DO  06/06/2015 2:00 PM

## 2015-06-08 LAB — CULTURE, GROUP A STREP: Organism ID, Bacteria: NORMAL

## 2015-06-16 ENCOUNTER — Other Ambulatory Visit: Payer: Self-pay | Admitting: Internal Medicine

## 2015-06-22 ENCOUNTER — Encounter: Payer: Self-pay | Admitting: Family Medicine

## 2015-07-14 ENCOUNTER — Other Ambulatory Visit: Payer: Self-pay | Admitting: Internal Medicine

## 2015-08-10 DIAGNOSIS — M25561 Pain in right knee: Secondary | ICD-10-CM | POA: Diagnosis not present

## 2015-08-19 ENCOUNTER — Other Ambulatory Visit: Payer: Self-pay | Admitting: Internal Medicine

## 2015-09-02 ENCOUNTER — Other Ambulatory Visit: Payer: Self-pay | Admitting: Internal Medicine

## 2015-09-15 ENCOUNTER — Other Ambulatory Visit: Payer: Self-pay | Admitting: Internal Medicine

## 2015-10-07 ENCOUNTER — Other Ambulatory Visit: Payer: Self-pay | Admitting: Internal Medicine

## 2015-10-08 ENCOUNTER — Other Ambulatory Visit: Payer: Self-pay | Admitting: Internal Medicine

## 2015-10-28 ENCOUNTER — Other Ambulatory Visit: Payer: Self-pay | Admitting: Internal Medicine

## 2015-11-17 ENCOUNTER — Other Ambulatory Visit: Payer: Self-pay | Admitting: Internal Medicine

## 2015-11-25 DIAGNOSIS — M25561 Pain in right knee: Secondary | ICD-10-CM | POA: Diagnosis not present

## 2015-11-25 DIAGNOSIS — M1711 Unilateral primary osteoarthritis, right knee: Secondary | ICD-10-CM | POA: Diagnosis not present

## 2015-12-01 ENCOUNTER — Other Ambulatory Visit: Payer: Self-pay | Admitting: Internal Medicine

## 2015-12-09 ENCOUNTER — Other Ambulatory Visit: Payer: Self-pay | Admitting: *Deleted

## 2015-12-09 MED ORDER — NEBIVOLOL HCL 10 MG PO TABS: 10.0000 mg | ORAL_TABLET | Freq: Every day | ORAL | 0 refills | Status: DC

## 2015-12-09 MED ORDER — LEVOTHYROXINE SODIUM 50 MCG PO TABS: ORAL_TABLET | ORAL | 0 refills | Status: DC

## 2015-12-09 NOTE — Addendum Note (Signed)
Addended by: Deatra JamesBRAND, Quenesha Douglass M on: 12/09/2015 04:02 PM   Modules accepted: Orders

## 2015-12-23 LAB — GLUCOSE, POCT (MANUAL RESULT ENTRY): POC GLUCOSE: 132 mg/dL — AB (ref 70–99)

## 2016-01-03 ENCOUNTER — Other Ambulatory Visit: Payer: Self-pay | Admitting: Internal Medicine

## 2016-01-10 ENCOUNTER — Ambulatory Visit (INDEPENDENT_AMBULATORY_CARE_PROVIDER_SITE_OTHER): Payer: PPO | Admitting: Internal Medicine

## 2016-01-10 ENCOUNTER — Encounter: Payer: Self-pay | Admitting: Internal Medicine

## 2016-01-10 ENCOUNTER — Other Ambulatory Visit (INDEPENDENT_AMBULATORY_CARE_PROVIDER_SITE_OTHER): Payer: PPO

## 2016-01-10 VITALS — BP 142/92 | HR 68 | Temp 98.2°F | Resp 12 | Ht 64.0 in | Wt 187.0 lb

## 2016-01-10 DIAGNOSIS — E039 Hypothyroidism, unspecified: Secondary | ICD-10-CM

## 2016-01-10 DIAGNOSIS — I10 Essential (primary) hypertension: Secondary | ICD-10-CM

## 2016-01-10 LAB — LIPID PANEL
CHOL/HDL RATIO: 6
Cholesterol: 247 mg/dL — ABNORMAL HIGH (ref 0–200)
HDL: 42.9 mg/dL (ref >39.00)

## 2016-01-10 LAB — LDL CHOLESTEROL, DIRECT: LDL DIRECT: 104 mg/dL

## 2016-01-10 LAB — COMPREHENSIVE METABOLIC PANEL
ALT: 27 U/L (ref 0–35)
AST: 22 U/L (ref 0–37)
Albumin: 4 g/dL (ref 3.5–5.2)
Alkaline Phosphatase: 43 U/L (ref 39–117)
BUN: 19 mg/dL (ref 6–23)
CHLORIDE: 103 meq/L (ref 96–112)
CO2: 27 meq/L (ref 19–32)
Calcium: 9.4 mg/dL (ref 8.4–10.5)
Creatinine, Ser: 1.03 mg/dL (ref 0.40–1.20)
GFR: 55.05 mL/min — AB (ref >60.00)
GLUCOSE: 106 mg/dL — AB (ref 70–99)
POTASSIUM: 4.4 meq/L (ref 3.5–5.1)
SODIUM: 138 meq/L (ref 135–145)
TOTAL PROTEIN: 7.7 g/dL (ref 6.0–8.3)
Total Bilirubin: 0.6 mg/dL (ref 0.2–1.2)

## 2016-01-10 LAB — TSH: TSH: 3.69 u[IU]/mL (ref 0.35–4.50)

## 2016-01-10 LAB — BRAIN NATRIURETIC PEPTIDE: Pro B Natriuretic peptide (BNP): 56 pg/mL (ref 0.0–100.0)

## 2016-01-10 NOTE — Assessment & Plan Note (Signed)
Checking BNP and CMP today and adjust as needed. BP mildly elevated today. Taking HCTZ and losartan and bystolic daily.

## 2016-01-10 NOTE — Progress Notes (Signed)
   Subjective:    Patient ID: Kayla Craig, female    DOB: 1936/02/01, 80 y.o.   MRN: [id number]  HPI The patient is a 80 YO female coming in for follow up of her thyroid. She is also having some mild swelling in her ankles which worsens through the day and clears in the morning. She denies hot or cold intolerance. No weight change. No palpitations or tremors.   Review of Systems  Constitutional: Negative for activity change, appetite change, fatigue and fever.  Respiratory: Negative for cough and shortness of breath.   Cardiovascular: Negative for chest pain, palpitations and leg swelling.  Gastrointestinal: Negative for abdominal distention, abdominal pain, constipation, diarrhea and nausea.  Endocrine: Negative.  Negative for cold intolerance.  Musculoskeletal: Negative for arthralgias and myalgias.  Neurological: Negative for dizziness, weakness, light-headedness, numbness and headaches.      Objective:   Physical Exam  Constitutional: She is oriented to person, place, and time. She appears well-developed and well-nourished.  HENT:  Head: Normocephalic and atraumatic.  Eyes: EOM are normal.  Neck: Normal range of motion.  Cardiovascular: Normal rate and regular rhythm.   Pulmonary/Chest: Effort normal and breath sounds normal. No respiratory distress. She has no wheezes. She has no rales.  Abdominal: Soft. She exhibits no distension. There is no tenderness. There is no rebound.  Musculoskeletal: She exhibits no edema.  Trace edema in the ankles, non-pitting.   Neurological: She is alert and oriented to person, place, and time. Coordination normal.  Skin: Skin is warm and dry.   Vitals:   01/10/16 0959  BP: (!) 142/92  Pulse: 68  Resp: 12  Temp: 98.2 F (36.8 C)  TempSrc: Oral  SpO2: 96%  Weight: 187 lb (84.8 kg)  Height: 5\' 4"  (1.626 m)      Assessment & Plan:

## 2016-01-10 NOTE — Progress Notes (Signed)
Pre visit review using our clinic review tool, if applicable. No additional management support is needed unless otherwise documented below in the visit note. 

## 2016-01-10 NOTE — Assessment & Plan Note (Signed)
Taking synthroid 50 mcg daily, checking TSH and adjust as needed.  

## 2016-01-10 NOTE — Patient Instructions (Signed)
We are checking the labs today and will call you back with the results.    You can come back for the physical if you want before the end of the year.

## 2016-01-13 ENCOUNTER — Other Ambulatory Visit: Payer: Self-pay | Admitting: Internal Medicine

## 2016-01-15 ENCOUNTER — Other Ambulatory Visit: Payer: Self-pay | Admitting: Internal Medicine

## 2016-02-15 ENCOUNTER — Other Ambulatory Visit: Payer: Self-pay | Admitting: Internal Medicine

## 2016-03-14 ENCOUNTER — Other Ambulatory Visit: Payer: Self-pay | Admitting: Internal Medicine

## 2016-03-20 DIAGNOSIS — M109 Gout, unspecified: Secondary | ICD-10-CM | POA: Diagnosis not present

## 2016-03-20 DIAGNOSIS — J209 Acute bronchitis, unspecified: Secondary | ICD-10-CM | POA: Diagnosis not present

## 2016-03-20 DIAGNOSIS — H6692 Otitis media, unspecified, left ear: Secondary | ICD-10-CM | POA: Diagnosis not present

## 2016-04-11 ENCOUNTER — Other Ambulatory Visit: Payer: Self-pay | Admitting: Internal Medicine

## 2016-04-20 ENCOUNTER — Telehealth: Payer: Self-pay | Admitting: Internal Medicine

## 2016-04-20 NOTE — Telephone Encounter (Signed)
Spoke with patient. She stated that she will give office a call back to schedule her annual wellness appt.

## 2016-05-01 ENCOUNTER — Other Ambulatory Visit: Payer: Self-pay | Admitting: Internal Medicine

## 2016-06-04 ENCOUNTER — Other Ambulatory Visit: Payer: Self-pay | Admitting: Internal Medicine

## 2016-07-26 DIAGNOSIS — M109 Gout, unspecified: Secondary | ICD-10-CM | POA: Diagnosis not present

## 2016-09-06 ENCOUNTER — Ambulatory Visit (INDEPENDENT_AMBULATORY_CARE_PROVIDER_SITE_OTHER): Payer: PPO | Admitting: Internal Medicine

## 2016-09-06 ENCOUNTER — Encounter: Payer: Self-pay | Admitting: Internal Medicine

## 2016-09-06 VITALS — BP 162/90 | HR 75 | Ht 64.0 in | Wt 186.0 lb

## 2016-09-06 DIAGNOSIS — M109 Gout, unspecified: Secondary | ICD-10-CM

## 2016-09-06 DIAGNOSIS — I10 Essential (primary) hypertension: Secondary | ICD-10-CM | POA: Diagnosis not present

## 2016-09-06 MED ORDER — PREDNISONE 10 MG PO TABS: ORAL_TABLET | ORAL | 0 refills | Status: DC

## 2016-09-06 MED ORDER — ALLOPURINOL 300 MG PO TABS: 300.0000 mg | ORAL_TABLET | Freq: Every day | ORAL | 3 refills | Status: DC

## 2016-09-06 MED ORDER — INDOMETHACIN 50 MG PO CAPS
50.0000 mg | ORAL_CAPSULE | Freq: Three times a day (TID) | ORAL | 1 refills | Status: DC | PRN
Start: 2016-09-06 — End: 2017-01-25

## 2016-09-06 MED ORDER — TRAMADOL HCL 50 MG PO TABS
50.0000 mg | ORAL_TABLET | Freq: Three times a day (TID) | ORAL | 0 refills | Status: DC | PRN
Start: 2016-09-06 — End: 2018-01-28

## 2016-09-06 MED ORDER — AMLODIPINE BESYLATE 5 MG PO TABS: 5.0000 mg | ORAL_TABLET | Freq: Every day | ORAL | 3 refills | Status: DC

## 2016-09-06 MED ORDER — METHYLPREDNISOLONE ACETATE 80 MG/ML IJ SUSP
80.0000 mg | Freq: Once | INTRAMUSCULAR | Status: AC
  Administered 2016-09-06: 80 mg via INTRAMUSCULAR

## 2016-09-06 NOTE — Progress Notes (Signed)
Subjective:    Patient ID: Kayla Craig, female    DOB: 01-Feb-1936, 81 y.o.   MRN: [id number]  HPI  Here with 3 days onset severe right first MTP red/tender/swelling without fever, trauma. But this is 3rd episode since dec 2017  Started HCT late last yr, now off but still with another episode this wk.  BP higher today off HCT  No hx CKD, No recent uric acid level done.  Had diarrhea with colchicine, was hoping for different tx this time Pt denies chest pain, increased sob or doe, wheezing, orthopnea, PND, increased LE swelling, palpitations, dizziness or syncope.   Pt denies polydipsia, polyuria.     Past Medical History:  Diagnosis Date  . B12 deficiency   . Back pain    Chronic  . Goiter   . 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 Hypertension   . TMJ (dislocation of temporomandibular joint)    Right Side  . Vitamin D deficiency    Past Surgical History:  Procedure Laterality Date  . EYE SURGERY  2010   cataract surgery with implants  . KNEE SURGERY     Right Arthroscopy '05 Dr Charlann Boxerlin  . THYROIDECTOMY     August '09 for goiter (Dr Jearld FentonByers)  . TUBAL LIGATION      reports that she has never smoked. She has never used smokeless tobacco. She reports that she does not drink alcohol. Her drug history is not on file. family history includes Cancer in her brother and mother; Heart disease in her brother, brother, and father. Allergies  Allergen Reactions  . Codeine Nausea And Vomiting    Dizzy   Current Outpatient Prescriptions on File Prior to Visit  Medication Sig Dispense Refill  . aspirin 81 MG EC tablet Take 81 mg by mouth daily.      . Calcium Carbonate-Vit D-Min (CALCIUM 1200 PO) Take by mouth.    . Cholecalciferol (VITAMIN D-3) 1000 UNITS CAPS Take 1 capsule by mouth daily.    Marland Kitchen. levothyroxine (SYNTHROID, LEVOTHROID) 50 MCG tablet Take 1 tablet (50 mcg total) by mouth daily before breakfast. TAKE ONE TABLET BY MOUTH ONCE DAILY BEFORE BREAKFAST 90 tablet 1  .  losartan (COZAAR) 100 MG tablet Take 1 tablet (100 mg total) by mouth daily. 90 tablet 1  . nebivolol (BYSTOLIC) 10 MG tablet Take 1 tablet (10 mg total) by mouth daily. 90 tablet 1  . omega-3 acid ethyl esters (LOVAZA) 1 G capsule Take 2 capsules (2 g total) by mouth 2 (two) times daily. 120 capsule 5  . pantoprazole (PROTONIX) 40 MG tablet Take 1 tablet (40 mg total) by mouth daily. 90 tablet 1  . vitamin B-12 (CYANOCOBALAMIN) 1000 MCG tablet Take 1,000 mcg by mouth daily.       No current facility-administered medications on file prior to visit.    Review of Systems All otherwise neg per pt    Objective:   Physical Exam BP (!) 162/90   Pulse 75   Ht 5\' 4"  (1.626 m)   Wt 186 lb (84.4 kg)   SpO2 99%   BMI 31.93 kg/m  VS noted, non toxic but limping Constitutional: Pt appears in NAD HENT: Head: NCAT.  Right Ear: External ear normal.  Left Ear: External ear normal.  Eyes: . Pupils are equal, round, and reactive to light. Conjunctivae and EOM are normal Nose: without d/c or deformity Neck: Neck supple. Gross normal ROM Cardiovascular: Normal rate and regular  rhythm.   Pulmonary/Chest: Effort normal and breath sounds without rales or wheezing.  Right first MTP with 2+ red/tender/swelling without ulcer Neurological: Pt is alert. At baseline orientation, motor grossly intact Skin: Skin is warm. No rashes, other new lesions, no LE edema Psychiatric: Pt behavior is normal without agitation  No other exam findings    Assessment & Plan:

## 2016-09-06 NOTE — Patient Instructions (Addendum)
You had the steroid shot today  Please take all new medication as prescribed  - the prednisone, and the pain medication, as well as the amlodipine 5 mg daily  Please take all new medication as prescribed - the allopurinol every day after the current episode is done, and the Indomethacin anti-inflammatory to keep on hand only if it tries to come back  Please continue all other medications as before, and refills have been done if requested.  Please have the pharmacy call with any other refills you may need.  Please keep your appointments with your specialists as you may have planned

## 2016-09-06 NOTE — Assessment & Plan Note (Signed)
mod, for depomedrol IM 80, predpac asd, pain control, then start allopurinol 300 qd preventive, for uric acid level with next labs, and indocin 50 tid prn any recurrence,  to f/u any worsening symptoms or concerns

## 2016-09-06 NOTE — Assessment & Plan Note (Signed)
Worsening off HCT, to add amlodipine 5 qd, cont to monitor BP at home and next visit BP Readings from Last 3 Encounters:  09/06/16 (!) 162/90  01/10/16 (!) 142/92  12/23/15 124/72

## 2016-10-19 ENCOUNTER — Encounter: Payer: Self-pay | Admitting: Internal Medicine

## 2016-10-19 ENCOUNTER — Ambulatory Visit (INDEPENDENT_AMBULATORY_CARE_PROVIDER_SITE_OTHER): Payer: PPO | Admitting: Internal Medicine

## 2016-10-19 VITALS — BP 142/82 | HR 65 | Ht 64.0 in | Wt 188.0 lb

## 2016-10-19 DIAGNOSIS — I10 Essential (primary) hypertension: Secondary | ICD-10-CM | POA: Diagnosis not present

## 2016-10-19 DIAGNOSIS — L989 Disorder of the skin and subcutaneous tissue, unspecified: Secondary | ICD-10-CM | POA: Diagnosis not present

## 2016-10-19 NOTE — Patient Instructions (Signed)
OK to try the Wart Away OTC  Please call for dermatology referral if not improved  Please continue all other medications as before, and refills have been done if requested.  Please have the pharmacy call with any other refills you may need.  Please keep your appointments with your specialists as you may have planned

## 2016-10-20 DIAGNOSIS — L989 Disorder of the skin and subcutaneous tissue, unspecified: Secondary | ICD-10-CM | POA: Insufficient documentation

## 2016-10-20 NOTE — Assessment & Plan Note (Signed)
I suspect viral warty lesion, pt states will try to use the OTC wart tx, but will call for derm if not helpful

## 2016-10-20 NOTE — Progress Notes (Signed)
Subjective:    Patient ID: Kayla Craig, female    DOB: [birth year], 81 y.o.   MRN: [id number]  HPI  Here with concern about 1 mo onset skin lesion to 1st MCP of index finger dorsal aspet, nontender pale and raised, new without trauma and nothing makes better or worse.  No fever, tender or drainage though is slight redness around it Pt denies chest pain, increased sob or doe, wheezing, orthopnea, PND, increased LE swelling, palpitations, dizziness or syncope. Past Medical History:  Diagnosis Date  . B12 deficiency   . Back pain    Chronic  . Goiter   . 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 Hypertension   . TMJ (dislocation of temporomandibular joint)    Right Side  . Vitamin D deficiency    Past Surgical History:  Procedure Laterality Date  . EYE SURGERY  2010   cataract surgery with implants  . KNEE SURGERY     Right Arthroscopy '05 Dr Charlann Boxerlin  . THYROIDECTOMY     August '09 for goiter (Dr Jearld FentonByers)  . TUBAL LIGATION      reports that she has never smoked. She has never used smokeless tobacco. She reports that she does not drink alcohol. Her drug history is not on file. family history includes Cancer in her brother and mother; Heart disease in her brother, brother, and father. Allergies  Allergen Reactions  . Codeine Nausea And Vomiting    Dizzy   Current Outpatient Prescriptions on File Prior to Visit  Medication Sig Dispense Refill  . allopurinol (ZYLOPRIM) 300 MG tablet Take 1 tablet (300 mg total) by mouth daily. 90 tablet 3  . amLODipine (NORVASC) 5 MG tablet Take 1 tablet (5 mg total) by mouth daily. 90 tablet 3  . aspirin 81 MG EC tablet Take 81 mg by mouth daily.      . Calcium Carbonate-Vit D-Min (CALCIUM 1200 PO) Take by mouth.    . Cholecalciferol (VITAMIN D-3) 1000 UNITS CAPS Take 1 capsule by mouth daily.    . indomethacin (INDOCIN) 50 MG capsule Take 1 capsule (50 mg total) by mouth 3 (three) times daily as needed. 60 capsule 1  . levothyroxine  (SYNTHROID, LEVOTHROID) 50 MCG tablet Take 1 tablet (50 mcg total) by mouth daily before breakfast. TAKE ONE TABLET BY MOUTH ONCE DAILY BEFORE BREAKFAST 90 tablet 1  . losartan (COZAAR) 100 MG tablet Take 1 tablet (100 mg total) by mouth daily. 90 tablet 1  . nebivolol (BYSTOLIC) 10 MG tablet Take 1 tablet (10 mg total) by mouth daily. 90 tablet 1  . omega-3 acid ethyl esters (LOVAZA) 1 G capsule Take 2 capsules (2 g total) by mouth 2 (two) times daily. 120 capsule 5  . pantoprazole (PROTONIX) 40 MG tablet Take 1 tablet (40 mg total) by mouth daily. 90 tablet 1  . traMADol (ULTRAM) 50 MG tablet Take 1 tablet (50 mg total) by mouth every 8 (eight) hours as needed. 30 tablet 0  . vitamin B-12 (CYANOCOBALAMIN) 1000 MCG tablet Take 1,000 mcg by mouth daily.       No current facility-administered medications on file prior to visit.    Review of Systems All otherwise neg per pt    Objective:   Physical Exam BP (!) 142/82   Pulse 65   Ht 5\' 4"  (1.626 m)   Wt 188 lb (85.3 kg)   SpO2 97%   BMI 32.27 kg/m  VS noted,  Constitutional:  Pt appears in NAD HENT: Head: NCAT.  Right Ear: External ear normal.  Left Ear: External ear normal.  Eyes: . Pupils are equal, round, and reactive to light. Conjunctivae and EOM are normal Nose: without d/c or deformity Neck: Neck supple. Gross normal ROM Cardiovascular: Normal rate and regular rhythm.   Pulmonary/Chest: Effort normal and breath sounds without rales or wheezing.  Neurological: Pt is alert. At baseline orientation, motor grossly intact Skin: Skin is warm. No rashes, no LE edema but has approx 8 mm pale warty raised lesion at the index finger MCP doral aspect Psychiatric: Pt behavior is normal without agitation  No other exam findings    Assessment & Plan:

## 2016-10-20 NOTE — Assessment & Plan Note (Signed)
stable overall by history and exam, recent data reviewed with pt, and pt to continue medical treatment as before,  to f/u any worsening symptoms or concerns BP Readings from Last 3 Encounters:  10/19/16 (!) 142/82  09/06/16 (!) 162/90  01/10/16 (!) 142/92

## 2016-10-24 ENCOUNTER — Other Ambulatory Visit: Payer: Self-pay | Admitting: Internal Medicine

## 2016-11-19 ENCOUNTER — Other Ambulatory Visit: Payer: Self-pay | Admitting: Internal Medicine

## 2016-12-17 ENCOUNTER — Other Ambulatory Visit: Payer: Self-pay | Admitting: Internal Medicine

## 2016-12-31 ENCOUNTER — Other Ambulatory Visit: Payer: Self-pay | Admitting: Internal Medicine

## 2017-01-01 ENCOUNTER — Other Ambulatory Visit: Payer: Self-pay | Admitting: Internal Medicine

## 2017-01-03 ENCOUNTER — Other Ambulatory Visit: Payer: Self-pay | Admitting: Family

## 2017-01-23 ENCOUNTER — Telehealth: Payer: Self-pay | Admitting: Family

## 2017-01-23 ENCOUNTER — Other Ambulatory Visit: Payer: Self-pay | Admitting: Internal Medicine

## 2017-01-25 ENCOUNTER — Other Ambulatory Visit (INDEPENDENT_AMBULATORY_CARE_PROVIDER_SITE_OTHER): Payer: PPO

## 2017-01-25 ENCOUNTER — Telehealth: Payer: Self-pay

## 2017-01-25 ENCOUNTER — Ambulatory Visit (INDEPENDENT_AMBULATORY_CARE_PROVIDER_SITE_OTHER): Payer: PPO | Admitting: Internal Medicine

## 2017-01-25 ENCOUNTER — Encounter: Payer: Self-pay | Admitting: Internal Medicine

## 2017-01-25 VITALS — BP 178/92 | HR 68 | Temp 97.6°F | Resp 20 | Ht 64.0 in | Wt 194.0 lb

## 2017-01-25 DIAGNOSIS — I10 Essential (primary) hypertension: Secondary | ICD-10-CM | POA: Diagnosis not present

## 2017-01-25 DIAGNOSIS — M109 Gout, unspecified: Secondary | ICD-10-CM | POA: Diagnosis not present

## 2017-01-25 DIAGNOSIS — E039 Hypothyroidism, unspecified: Secondary | ICD-10-CM | POA: Diagnosis not present

## 2017-01-25 DIAGNOSIS — Z0001 Encounter for general adult medical examination with abnormal findings: Secondary | ICD-10-CM | POA: Diagnosis not present

## 2017-01-25 DIAGNOSIS — K219 Gastro-esophageal reflux disease without esophagitis: Secondary | ICD-10-CM

## 2017-01-25 DIAGNOSIS — Z0000 Encounter for general adult medical examination without abnormal findings: Secondary | ICD-10-CM

## 2017-01-25 DIAGNOSIS — E781 Pure hyperglyceridemia: Secondary | ICD-10-CM | POA: Diagnosis not present

## 2017-01-25 LAB — URIC ACID: URIC ACID, SERUM: 9.3 mg/dL — AB (ref 2.4–7.0)

## 2017-01-25 MED ORDER — LOSARTAN POTASSIUM 100 MG PO TABS: 100.0000 mg | ORAL_TABLET | Freq: Every day | ORAL | 3 refills | Status: DC

## 2017-01-25 MED ORDER — AMLODIPINE BESYLATE 10 MG PO TABS: 10.0000 mg | ORAL_TABLET | Freq: Every day | ORAL | 3 refills | Status: DC

## 2017-01-25 MED ORDER — PANTOPRAZOLE SODIUM 40 MG PO TBEC: 40.0000 mg | DELAYED_RELEASE_TABLET | Freq: Every day | ORAL | 3 refills | Status: DC

## 2017-01-25 MED ORDER — NEBIVOLOL HCL 10 MG PO TABS
10.0000 mg | ORAL_TABLET | Freq: Every day | ORAL | 3 refills | Status: DC
Start: 2017-01-25 — End: 2018-02-11

## 2017-01-25 NOTE — Progress Notes (Signed)
Patient ID: Kayla Craig, female   DOB: Mar 11, 1936, 81 y.o.   MRN: [id number] Medical screening examination/treatment/procedure(s) were performed by non-physician practitioner and as supervising physician I was immediately available for consultation/collaboration. I agree with above. Myrlene BrokerElizabeth A Deijah Spikes, MD

## 2017-01-25 NOTE — Assessment & Plan Note (Signed)
Checking lipid panel and adjust as needed. Taking lovaza.

## 2017-01-25 NOTE — Progress Notes (Signed)
   Subjective:    Patient ID: Kayla Craig, female    DOB: December 13, 1935, 81 y.o.   MRN: [id number]  HPI The patient is a 81 YO female coming in for physical. BP running high at home.   [facility], [facility], social history reviewed and updated.   Review of Systems  Constitutional: Negative.   HENT: Negative.   Eyes: Negative.   Respiratory: Negative for cough, chest tightness and shortness of breath.   Cardiovascular: Negative for chest pain, palpitations and leg swelling.  Gastrointestinal: Negative for abdominal distention, abdominal pain, constipation, diarrhea, nausea and vomiting.  Musculoskeletal: Negative.   Skin: Negative.   Neurological: Negative.   Psychiatric/Behavioral: Negative.       Objective:   Physical Exam  Constitutional: She is oriented to person, place, and time. She appears well-developed and well-nourished.  HENT:  Head: Normocephalic and atraumatic.  Eyes: EOM are normal.  Neck: Normal range of motion.  Cardiovascular: Normal rate and regular rhythm.   Pulmonary/Chest: Effort normal and breath sounds normal. No respiratory distress. She has no wheezes. She has no rales.  Abdominal: Soft. Bowel sounds are normal. She exhibits no distension. There is no tenderness. There is no rebound.  Musculoskeletal: She exhibits no edema.  Neurological: She is alert and oriented to person, place, and time. Coordination normal.  Skin: Skin is warm and dry.  Psychiatric: She has a normal mood and affect.   Vitals:   01/25/17 0934 01/25/17 1000  BP: (!) 172/96 (!) 178/92  Pulse: 67 68  Resp: 20   Temp: 97.6 F (36.4 C)   SpO2: 98%   Weight: 194 lb (88 kg)   Height: 5\' 4"  (1.626 m)       Assessment & Plan:

## 2017-01-25 NOTE — Assessment & Plan Note (Signed)
Checking TSH and adjust synthroid 50 mcg daily as needed. 

## 2017-01-25 NOTE — Progress Notes (Signed)
Pre visit review using our clinic review tool, if applicable. No additional management support is needed unless otherwise documented below in the visit note. 

## 2017-01-25 NOTE — Patient Instructions (Addendum)
Continue doing brain stimulating activities (puzzles, reading, adult coloring books, staying active) to keep memory sharp.   Continue to eat heart healthy diet (full of fruits, vegetables, whole grains, lean protein, water--limit salt, fat, and sugar intake) and increase physical activity as tolerated.   Kayla Craig , Thank you for taking time to come for your Medicare Wellness Visit. I appreciate your ongoing commitment to your health goals. Please review the following plan we discussed and let me know if I can assist you in the future.   These are the goals we discussed: Goals    . Cut out extra servings          Cut back on what I eat; discussed plate method for portion  Monitor sodium; suggest monitoring food intake with bp checks for awhile     . Stay as healthy and active as possible          Increase physical activity and monitor diet closer.       This is a list of the screening recommended for you and due dates:  Health Maintenance  Topic Date Due  . Flu Shot  11/07/2016  . Tetanus Vaccine  05/08/2022  . DEXA scan (bone density measurement)  Completed  . Pneumonia vaccines  Completed    Heart-Healthy Eating Plan Heart-healthy meal planning includes:  Limiting unhealthy fats.  Increasing healthy fats.  Making other small dietary changes.  You may need to talk with your doctor or a diet specialist (dietitian) to create an eating plan that is right for you. What types of fat should I choose?  Choose healthy fats. These include olive oil and canola oil, flaxseeds, walnuts, almonds, and seeds.  Eat more omega-3 fats. These include salmon, mackerel, sardines, tuna, flaxseed oil, and ground flaxseeds. Try to eat fish at least twice each week.  Limit saturated fats. ? Saturated fats are often found in animal products, such as meats, butter, and cream. ? Plant sources of saturated fats include palm oil, palm kernel oil, and coconut oil.  Avoid foods with partially  hydrogenated oils in them. These include stick margarine, some tub margarines, cookies, crackers, and other baked goods. These contain trans fats. What general guidelines do I need to follow?  Check food labels carefully. Identify foods with trans fats or high amounts of saturated fat.  Fill one half of your plate with vegetables and green salads. Eat 4-5 servings of vegetables per day. A serving of vegetables is: ? 1 cup of raw leafy vegetables. ?  cup of raw or cooked cut-up vegetables. ?  cup of vegetable juice.  Fill one fourth of your plate with whole grains. Look for the word "whole" as the first word in the ingredient list.  Fill one fourth of your plate with lean protein foods.  Eat 4-5 servings of fruit per day. A serving of fruit is: ? One medium whole fruit. ?  cup of dried fruit. ?  cup of fresh, frozen, or canned fruit. ?  cup of 100% fruit juice.  Eat more foods that contain soluble fiber. These include apples, broccoli, carrots, beans, peas, and barley. Try to get 20-30 g of fiber per day.  Eat more home-cooked food. Eat less restaurant, buffet, and fast food.  Limit or avoid alcohol.  Limit foods high in starch and sugar.  Avoid fried foods.  Avoid frying your food. Try baking, boiling, grilling, or broiling it instead. You can also reduce fat by: ? Removing the skin from poultry. ?  Removing all visible fats from meats. ? Skimming the fat off of stews, soups, and gravies before serving them. ? Steaming vegetables in water or broth.  Lose weight if you are overweight.  Eat 4-5 servings of nuts, legumes, and seeds per week: ? One serving of dried beans or legumes equals  cup after being cooked. ? One serving of nuts equals 1 ounces. ? One serving of seeds equals  ounce or one tablespoon.  You may need to keep track of how much salt or sodium you eat. This is especially true if you have high blood pressure. Talk with your doctor or dietitian to get more  information. What foods can I eat? Grains Breads, including [place], white, pita, wheat, raisin, rye, oatmeal, and [place]. Tortillas that are neither fried nor made with lard or trans fat. Low-fat rolls, including hotdog and hamburger buns and English muffins. Biscuits. Muffins. Waffles. Pancakes. Light popcorn. Whole-grain cereals. Flatbread. Melba toast. Pretzels. Breadsticks. Rusks. Low-fat snacks. Low-fat crackers, including oyster, saltine, matzo, graham, animal, and rye. Rice and pasta, including brown rice and pastas that are made with whole wheat. Vegetables All vegetables. Fruits All fruits, but limit coconut. Meats and Other Protein Sources Lean, well-trimmed beef, veal, pork, and lamb. Chicken and [place] without skin. All fish and shellfish. Wild duck, rabbit, pheasant, and venison. Egg whites or low-cholesterol egg substitutes. Dried beans, peas, lentils, and tofu. Seeds and most nuts. Dairy Low-fat or nonfat cheeses, including ricotta, string, and mozzarella. Skim or 1% milk that is liquid, powdered, or evaporated. Buttermilk that is made with low-fat milk. Nonfat or low-fat yogurt. Beverages Mineral water. Diet carbonated beverages. Sweets and Desserts Sherbets and fruit ices. Honey, jam, marmalade, jelly, and syrups. Meringues and gelatins. Pure sugar candy, such as hard candy, jelly beans, gumdrops, mints, marshmallows, and small amounts of dark chocolate. [employer]. Eat all sweets and desserts in moderation. Fats and Oils Nonhydrogenated (trans-free) margarines. Vegetable oils, including soybean, sesame, sunflower, olive, peanut, safflower, corn, canola, and cottonseed. Salad dressings or mayonnaise made with a vegetable oil. Limit added fats and oils that you use for cooking, baking, salads, and as spreads. Other Cocoa powder. Coffee and tea. All seasonings and condiments. The items listed above may not be a complete list of recommended foods or beverages. Contact your  dietitian for more options. What foods are not recommended? Grains Breads that are made with saturated or trans fats, oils, or whole milk. Croissants. Butter rolls. Cheese breads. Sweet rolls. Donuts. Buttered popcorn. Chow mein noodles. High-fat crackers, such as cheese or butter crackers. Meats and Other Protein Sources Fatty meats, such as hotdogs, short ribs, sausage, spareribs, bacon, rib eye roast or steak, and mutton. High-fat deli meats, such as salami and bologna. Caviar. Domestic duck and goose. Organ meats, such as kidney, liver, sweetbreads, and heart. Dairy Cream, sour cream, cream cheese, and creamed cottage cheese. Whole-milk cheeses, including blue (bleu), [address], [place], [place], [address], [place], [address], cheddar, [place], and [place]. Whole or 2% milk that is liquid, evaporated, or condensed. Whole buttermilk. Cream sauce or high-fat cheese sauce. Yogurt that is made from whole milk. Beverages Regular sodas and juice drinks with added sugar. Sweets and Desserts Frosting. Pudding. Cookies. Cakes other than angel food cake. Candy that has milk chocolate or white chocolate, hydrogenated fat, butter, coconut, or unknown ingredients. Buttered syrups. Full-fat ice cream or ice cream drinks. Fats and Oils Gravy that has suet, meat fat, or shortening. Cocoa butter, hydrogenated oils, palm oil, coconut oil, palm  kernel oil. These can often be found in baked products, candy, fried foods, nondairy creamers, and whipped toppings. Solid fats and shortenings, including bacon fat, salt pork, lard, and butter. Nondairy cream substitutes, such as coffee creamers and sour cream substitutes. Salad dressings that are made of unknown oils, cheese, or sour cream. The items listed above may not be a complete list of foods and beverages to avoid. Contact your dietitian for more information. This information is not intended to replace advice given to you by your health care provider. Make sure you  discuss any questions you have with your health care provider. Document Released: 09/25/2011 Document Revised: 09/01/2015 Document Reviewed: 09/17/2013 Elsevier Interactive Patient Education  2018 [employer].  Carbohydrate Counting for Diabetes Mellitus, Adult Carbohydrate counting is a method for keeping track of how many carbohydrates you eat. Eating carbohydrates naturally increases the amount of sugar (glucose) in the blood. Counting how many carbohydrates you eat helps keep your blood glucose within normal limits, which helps you manage your diabetes (diabetes mellitus). It is important to know how many carbohydrates you can safely have in each meal. This is different for every person. A diet and nutrition specialist (registered dietitian) can help you make a meal plan and calculate how many carbohydrates you should have at each meal and snack. Carbohydrates are found in the following foods:  Grains, such as breads and cereals.  Dried beans and soy products.  Starchy vegetables, such as potatoes, peas, and corn.  Fruit and fruit juices.  Milk and yogurt.  Sweets and snack foods, such as cake, cookies, candy, chips, and soft drinks.  How do I count carbohydrates? There are two ways to count carbohydrates in food. You can use either of the methods or a combination of both. Reading "Nutrition Facts" on packaged food The "Nutrition Facts" list is included on the labels of almost all packaged foods and beverages in the [place] It includes:  The serving size.  Information about nutrients in each serving, including the grams (g) of carbohydrate per serving.  To use the "Nutrition Facts":  Decide how many servings you will have.  Multiply the number of servings by the number of carbohydrates per serving.  The resulting number is the total amount of carbohydrates that you will be having.  Learning standard serving sizes of other foods When you eat foods containing carbohydrates  that are not packaged or do not include "Nutrition Facts" on the label, you need to measure the servings in order to count the amount of carbohydrates:  Measure the foods that you will eat with a food scale or measuring cup, if needed.  Decide how many standard-size servings you will eat.  Multiply the number of servings by 15. Most carbohydrate-rich foods have about 15 g of carbohydrates per serving. ? For example, if you eat 8 oz (170 g) of strawberries, you will have eaten 2 servings and 30 g of carbohydrates (2 servings x 15 g = 30 g).  For foods that have more than one food mixed, such as soups and casseroles, you must count the carbohydrates in each food that is included.  The following list contains standard serving sizes of common carbohydrate-rich foods. Each of these servings has about 15 g of carbohydrates:   hamburger bun or  English muffin.   oz (15 mL) syrup.   oz (14 g) jelly.  1 slice of bread.  1 six-inch tortilla.  3 oz (85 g) cooked rice or pasta.  4 oz (113  g) cooked dried beans.  4 oz (113 g) starchy vegetable, such as peas, corn, or potatoes.  4 oz (113 g) hot cereal.  4 oz (113 g) mashed potatoes or  of a large baked potato.  4 oz (113 g) canned or frozen fruit.  4 oz (120 mL) fruit juice.  4-6 crackers.  6 chicken nuggets.  6 oz (170 g) unsweetened dry cereal.  6 oz (170 g) plain fat-free yogurt or yogurt sweetened with artificial sweeteners.  8 oz (240 mL) milk.  8 oz (170 g) fresh fruit or one small piece of fruit.  24 oz (680 g) popped popcorn.  Example of carbohydrate counting Sample meal  3 oz (85 g) chicken breast.  6 oz (170 g) brown rice.  4 oz (113 g) corn.  8 oz (240 mL) milk.  8 oz (170 g) strawberries with sugar-free whipped topping. Carbohydrate calculation 1. Identify the foods that contain carbohydrates: ? Rice. ? Corn. ? Milk. ? Strawberries. 2. Calculate how many servings you have of each food: ? 2  servings rice. ? 1 serving corn. ? 1 serving milk. ? 1 serving strawberries. 3. Multiply each number of servings by 15 g: ? 2 servings rice x 15 g = 30 g. ? 1 serving corn x 15 g = 15 g. ? 1 serving milk x 15 g = 15 g. ? 1 serving strawberries x 15 g = 15 g. 4. Add together all of the amounts to find the total grams of carbohydrates eaten: ? 30 g + 15 g + 15 g + 15 g = 75 g of carbohydrates total. This information is not intended to replace advice given to you by your health care provider. Make sure you discuss any questions you have with your health care provider. Document Released: 03/26/2005 Document Revised: 10/14/2015 Document Reviewed: 09/07/2015 Elsevier Interactive Patient Education  2018 [employer]. Reading Food Labels Foods that are packaged or in containers have a Nutrition Facts panel on the side or back. This is commonly called the food label. The food label helps you make healthy food choices by providing information about serving size and the amount of calories and various nutrients in the food. You can check the food label to find out if the food contains high or low amounts of nutrients that you want to limit in your diet. You can also use the food label to see if the food is a good source of the nutrients that you want to make sure are included in your diet. How do I read the food label?  Begin by checking the serving size and number of servings in the container. All of the nutrition information listed on the food label is based on one serving. If you eat more than one serving, you must multiply the amounts (such as calories, grams of saturated fat, or milligrams of sodium) by the number of servings.  Check the calories. Choosing foods that are low in calories can help you manage your weight.  Look at the numbers in the % Daily Value column for each listed nutrient. This gives you an idea of how much of the daily recommended amount for that nutrient is provided in one  serving of the food. A daily value of 5% or less is considered low. A daily value of 20% or more is considered high.  Check the amounts for the items you should limit in your diet. These include: ? Total fat. ? Saturated fat. ? Trans fat. ?  Cholesterol. ? Sodium.  Check the amounts for the items you should make sure you get enough of. These include: ? Dietary fiber. ? Vitamins A and C. ? Calcium. ? Iron. What information is provided on the food label? Serving information  Serving size. ? The serving size is listed in cups or pieces. The nutrient amounts listed on the food label apply to this amount of the food.  Servings per container or package. ? This shows the number of servings you can expect to get from the container or package if you follow the suggested serving size. Amount per serving  Calories. ? The number of calories in one serving of the food. This information is helpful in managing weight. Low-calorie foods contain 40 calories or less. High-calorie foods contain 400 or more calories.  Calories from fat. ? The number of calories that come from fat in one serving. Percent daily value Percent daily value (shown on the label as % Daily Value) tells you what percent of the daily value for each nutrient one serving provides. The daily value is the recommended amount of the nutrient that you should get each day. For example, if 15% is listed next to dietary fiber, it means that one serving of the food will give you 15% of the recommended amount of fiber that you should get in a day. The daily values are based on a 2,000-calorie-per-day diet. You may get more or less than 2,000 calories in your diet each day, but the % Daily Value gives you an idea of whether the food contains a high or low amount of the listed nutrient. A daily value of 5% or less is low. A daily value of 20% or more is high. Total fat Total fat shows you the total amount of fat in one serving (listed in grams).  Foods with high amounts of fat usually have higher calories and may lead to weight gain. Two of the fats that make up a portion of the total fat are included on the label:  Saturated fat. ? This number is the amount of saturated fat in one serving (listed in grams). Saturated fat increases the amount of blood cholesterol and should be limited to less than 7% of total calories each day. This means that if you eat 2,000 calories each day, you should eat less than 140 calories from saturated fat.    Cholesterol The amount of cholesterol in one serving is listed in milligrams. Cholesterol should be limited to no more than 300 mg each day. Sodium The amount of sodium in one serving is listed in milligrams. Most people should limit their sodium intake to 2,300 mg a day. Total carbohydrate This number shows the amount of total carbohydrate in one serving (listed in grams). This information can help people with diabetes manage the amount of carbohydrate they eat. Two of the carbohydrates that make up a portion of the total carbohydrate are included on the label:  Dietary fiber. ? The amount of dietary fiber in one serving is listed in grams. Most people should eat at least 25 g of dietary fiber each day.  Sugars. ? The amount of sugar in one serving is also listed in grams. This value includes both naturally occurring sugars from fruit and milk and added sugars such as honey or table sugar.  Protein The amount of protein in one serving is listed in grams. What other important labeling is on the food package? Ingredients Food labels will list each ingredient in  the food. The first ingredient listed is the ingredient that the food has the most of. The ingredients are listed in the order of their amount by weight from highest to lowest. Food allergen labeling Food labels may also include a food allergen warning. Listed here are ingredients that can cause allergic reactions in some people. The  potential allergens are listed behind the word "Contains" or "May contain." Examples of ingredients that may be listed are wheat, dairy, eggs, soy, and nuts. If a person knows that he or she is allergic to one of these ingredients, he or she will know to avoid that food. Where to find more information:  [place] Food and Drug Administration: [employer]com.eewww.fda.gov This information is not intended to replace advice given to you by your health care provider. Make sure you discuss any questions you have with your health care provider. Document Released: 03/26/2005 Document Revised: 11/23/2015 Document Reviewed: 02/16/2013 Elsevier Interactive Patient Education  2017 [employer]

## 2017-01-25 NOTE — Assessment & Plan Note (Signed)
Taking protonix and well controlled. She would like to continue as symptoms were severe.

## 2017-01-25 NOTE — Progress Notes (Signed)
Subjective:   Kayla Craig is a 81 y.o. female who presents for Medicare Annual (Subsequent) preventive examination.  Review of Systems:  No ROS.  Medicare Wellness Visit. Additional risk factors are reflected in the social history.    Sleep patterns: feels rested on waking, gets up 2-3 times nightly to void and sleeps 6-7 hours nightly.    Home Safety/Smoke Alarms: Feels safe in home. Smoke alarms in place.  Living environment; residence and Firearm Safety: 1-story house/ trailer, no firearms, Lives alone, no needs for DME, good support system. Seat Belt Safety/Bike Helmet: Wears seat belt.     Objective:     Vitals: There were no vitals taken for this visit.  There is no height or weight on file to calculate BMI.   Tobacco History  Smoking Status  . Never Smoker  Smokeless Tobacco  . Never Used     Counseling given: Not Answered   Past Medical History:  Diagnosis Date  . B12 deficiency   . Back pain    Chronic  . Goiter   . 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 Hypertension   . TMJ (dislocation of temporomandibular joint)    Right Side  . Vitamin D deficiency    Past Surgical History:  Procedure Laterality Date  . EYE SURGERY  2010   cataract surgery with implants  . KNEE SURGERY     Right Arthroscopy '05 Dr Charlann Boxerlin  . THYROIDECTOMY     August '09 for goiter (Dr Jearld FentonByers)  . TUBAL LIGATION     Family History  Problem Relation Age of Onset  . Cancer Mother   . Heart disease Father        CAD/MI  . Heart disease Brother        CAD/MI  . Diabetes Neg Hx   . Heart disease Brother        CAD/MI  . Cancer Brother    History  Sexual Activity  . Sexual activity: No    Outpatient Encounter Prescriptions as of 01/25/2017  Medication Sig  . allopurinol (ZYLOPRIM) 300 MG tablet Take 1 tablet (300 mg total) by mouth daily.  Marland Kitchen. amLODipine (NORVASC) 5 MG tablet Take 1 tablet (5 mg total) by mouth daily.  Marland Kitchen. aspirin 81 MG EC tablet Take 81 mg by  mouth daily.    . Calcium Carbonate-Vit D-Min (CALCIUM 1200 PO) Take by mouth.  . Cholecalciferol (VITAMIN D-3) 1000 UNITS CAPS Take 1 capsule by mouth daily.  . indomethacin (INDOCIN) 50 MG capsule Take 1 capsule (50 mg total) by mouth 3 (three) times daily as needed.  Marland Kitchen. levothyroxine (SYNTHROID, LEVOTHROID) 50 MCG tablet TAKE ONE TABLET BY MOUTH ONCE DAILY BEFORE BREAKFAST  . losartan (COZAAR) 100 MG tablet Take 1 tablet (100 mg total) by mouth daily. Annual appt due in Oct must see provider for future refills  . nebivolol (BYSTOLIC) 10 MG tablet Take 1 tablet (10 mg total) by mouth daily. Need office visit for further refills  . omega-3 acid ethyl esters (LOVAZA) 1 G capsule Take 2 capsules (2 g total) by mouth 2 (two) times daily.  . pantoprazole (PROTONIX) 40 MG tablet Take 1 tablet (40 mg total) by mouth daily. Annual appt due in Oct must see provider for future refills  . traMADol (ULTRAM) 50 MG tablet Take 1 tablet (50 mg total) by mouth every 8 (eight) hours as needed.  . vitamin B-12 (CYANOCOBALAMIN) 1000 MCG tablet Take 1,000 mcg  by mouth daily.     No facility-administered encounter medications on file as of 01/25/2017.     Activities of Daily Living No flowsheet data found.  Patient Care Team: Myrlene Broker, MD as PCP - General (Internal Medicine) Nelson Chimes, MD (Ophthalmology) Durene Romans, MD (Orthopedic Surgery)    Assessment:    Physical assessment deferred to PCP.  Exercise Activities and Dietary recommendations   Diet (meal preparation, eat out, water intake, caffeinated beverages, dairy products, fruits and vegetables): in general, a "healthy" diet    Reviewed heart healthy and diabetic diet. Diet education was attached to patient's AVS.  Goals    . Cut out extra servings          Cut back on what I eat; discussed plate method for portion  Monitor sodium; suggest monitoring food intake with bp checks for awhile       Fall Risk Fall Risk   01/10/2016 12/14/2014  Falls in the past year? Yes No  Number falls in past yr: 1 -  Injury with Fall? No -   Depression Screen PHQ 2/9 Scores 01/10/2016 12/14/2014  PHQ - 2 Score 0 0     Cognitive Function MMSE - Mini Mental State Exam 12/14/2014  Not completed: (No Data)       Ad8 score reviewed for issues:  Issues making decisions: no  Less interest in hobbies / activities: no  Repeats questions, stories (family complaining): no  Trouble using ordinary gadgets (microwave, computer, phone):no  Forgets the month or year: no  Mismanaging finances: no  Remembering appts: no  Daily problems with thinking and/or memory: no Ad8 score is= 0  Immunization History  Administered Date(s) Administered  . Influenza Whole 01/31/2009, 02/28/2012  . Influenza,inj,Quad PF,6+ Mos 01/11/2014  . Influenza-Unspecified 01/29/2013, 12/22/2015  . Pneumococcal Conjugate-13 12/14/2014  . Pneumococcal Polysaccharide-23 05/08/2012  . Tetanus 05/08/2012   Screening Tests Health Maintenance  Topic Date Due  . INFLUENZA VACCINE  11/07/2016  . TETANUS/TDAP  05/08/2022  . DEXA SCAN  Completed  . PNA vac Low Risk Adult  Completed      Plan:  Continue doing brain stimulating activities (puzzles, reading, adult coloring books, staying active) to keep memory sharp.   Continue to eat heart healthy diet (full of fruits, vegetables, whole grains, lean protein, water--limit salt, fat, and sugar intake) and increase physical activity as tolerated.  I have personally reviewed and noted the following in the patient's chart:   . Medical and social history . Use of alcohol, tobacco or illicit drugs  . Current medications and supplements . Functional ability and status . Nutritional status . Physical activity . Advanced directives . List of other physicians . Vitals . Screenings to include cognitive, depression, and falls . Referrals and appointments  In addition, I have reviewed and discussed  with patient certain preventive protocols, quality metrics, and best practice recommendations. A written personalized care plan for preventive services as well as general preventive health recommendations were provided to patient.     Wanda Plump, RN  01/25/2017

## 2017-01-25 NOTE — Telephone Encounter (Signed)
LVM for patient to come back and get new labs drawn. And to call back if they had any questions

## 2017-01-25 NOTE — Assessment & Plan Note (Addendum)
Diet is poor recently with extra sodium. We talked about reducing sodium and increasing amlodipine to 10 mg daily and continue losartan and bystolic. Checking CMP and adjust as needed.

## 2017-01-25 NOTE — Assessment & Plan Note (Signed)
Given flu shot at visit, mammogram and colonoscopy up to date. Tetanus and pneumonia up to date. Counseled about shingrix. Counseled about sun safety and mole surveillance. Given 10 year screening recommendations.

## 2017-01-28 ENCOUNTER — Other Ambulatory Visit: Payer: Self-pay

## 2017-01-28 ENCOUNTER — Ambulatory Visit: Payer: PPO

## 2017-01-28 ENCOUNTER — Other Ambulatory Visit (INDEPENDENT_AMBULATORY_CARE_PROVIDER_SITE_OTHER): Payer: PPO

## 2017-01-28 VITALS — BP 198/102

## 2017-01-28 DIAGNOSIS — Z0000 Encounter for general adult medical examination without abnormal findings: Secondary | ICD-10-CM | POA: Diagnosis not present

## 2017-01-28 DIAGNOSIS — I10 Essential (primary) hypertension: Secondary | ICD-10-CM

## 2017-01-28 LAB — COMPREHENSIVE METABOLIC PANEL
ALBUMIN: 4.2 g/dL (ref 3.5–5.2)
ALK PHOS: 56 U/L (ref 39–117)
ALT: 20 U/L (ref 0–35)
AST: 18 U/L (ref 0–37)
BILIRUBIN TOTAL: 0.5 mg/dL (ref 0.2–1.2)
BUN: 20 mg/dL (ref 6–23)
CO2: 25 mEq/L (ref 19–32)
CREATININE: 0.94 mg/dL (ref 0.40–1.20)
Calcium: 9.7 mg/dL (ref 8.4–10.5)
Chloride: 103 mEq/L (ref 96–112)
GFR: 61.01 mL/min (ref >60.00)
Glucose, Bld: 111 mg/dL — ABNORMAL HIGH (ref 70–99)
Potassium: 3.9 mEq/L (ref 3.5–5.1)
Sodium: 139 mEq/L (ref 135–145)
TOTAL PROTEIN: 7.7 g/dL (ref 6.0–8.3)

## 2017-01-28 LAB — CBC
HCT: 41 % (ref 36.0–46.0)
HEMOGLOBIN: 13.8 g/dL (ref 12.0–15.0)
MCHC: 33.6 g/dL (ref 30.0–36.0)
MCV: 86.7 fl (ref 78.0–100.0)
PLATELETS: 245 10*3/uL (ref 150.0–400.0)
RBC: 4.73 Mil/uL (ref 3.87–5.11)
RDW: 13.9 % (ref 11.5–15.5)
WBC: 9.8 10*3/uL (ref 4.0–10.5)

## 2017-01-28 LAB — LIPID PANEL
CHOL/HDL RATIO: 7
Cholesterol: 276 mg/dL — ABNORMAL HIGH (ref 0–200)
HDL: 39.2 mg/dL (ref >39.00)
Triglycerides: 782 mg/dL — ABNORMAL HIGH (ref 0.0–149.0)

## 2017-01-28 LAB — HEMOGLOBIN A1C: HEMOGLOBIN A1C: 7 % — AB (ref 4.6–6.5)

## 2017-01-28 LAB — TSH: TSH: 2.41 u[IU]/mL (ref 0.35–4.50)

## 2017-01-28 LAB — LDL CHOLESTEROL, DIRECT: LDL DIRECT: 92 mg/dL

## 2017-01-28 MED ORDER — LEVOTHYROXINE SODIUM 50 MCG PO TABS: ORAL_TABLET | ORAL | 1 refills | Status: DC

## 2017-01-28 NOTE — Telephone Encounter (Signed)
levothyroxine (SYNTHROID, LEVOTHROID) 50 MCG tablet   PCP is Dr.Crawford, patient need this medication refilled.

## 2017-01-31 ENCOUNTER — Telehealth: Payer: Self-pay | Admitting: Internal Medicine

## 2017-01-31 NOTE — Telephone Encounter (Signed)
Please see result note for lab results

## 2017-01-31 NOTE — Telephone Encounter (Signed)
Pt called and would like a call back regarding her lab work from 10/22 and has questions about her ned medications, she wants to make sure she is taking the right thing. Please advise and call back

## 2017-02-01 ENCOUNTER — Telehealth: Payer: Self-pay

## 2017-02-01 NOTE — Telephone Encounter (Signed)
Pt has been informed and expressed understanding.  

## 2017-02-01 NOTE — Telephone Encounter (Signed)
Spoke with patient and discussed medications.

## 2017-02-01 NOTE — Telephone Encounter (Signed)
-----   Message from Myrlene BrokerElizabeth A Crawford, MD sent at 01/31/2017  5:07 PM EDT ----- Please call and let her know that the labs are back and her cholesterol is okay. She does have signs of new diabetes but this is under good control and she does not need medication. Her kidneys and liver are normal.

## 2017-04-15 DIAGNOSIS — D0461 Carcinoma in situ of skin of right upper limb, including shoulder: Secondary | ICD-10-CM | POA: Diagnosis not present

## 2017-04-15 DIAGNOSIS — L720 Epidermal cyst: Secondary | ICD-10-CM | POA: Diagnosis not present

## 2017-07-15 DIAGNOSIS — L821 Other seborrheic keratosis: Secondary | ICD-10-CM | POA: Diagnosis not present

## 2017-07-15 DIAGNOSIS — Z85828 Personal history of other malignant neoplasm of skin: Secondary | ICD-10-CM | POA: Diagnosis not present

## 2017-07-19 ENCOUNTER — Other Ambulatory Visit: Payer: Self-pay | Admitting: Internal Medicine

## 2017-10-15 DIAGNOSIS — Z820 Family history of epilepsy and other diseases of the nervous system: Secondary | ICD-10-CM | POA: Diagnosis not present

## 2017-10-15 DIAGNOSIS — E039 Hypothyroidism, unspecified: Secondary | ICD-10-CM | POA: Diagnosis not present

## 2017-10-15 DIAGNOSIS — G8929 Other chronic pain: Secondary | ICD-10-CM | POA: Diagnosis not present

## 2017-10-15 DIAGNOSIS — I10 Essential (primary) hypertension: Secondary | ICD-10-CM | POA: Diagnosis not present

## 2017-10-15 DIAGNOSIS — Z809 Family history of malignant neoplasm, unspecified: Secondary | ICD-10-CM | POA: Diagnosis not present

## 2017-10-15 DIAGNOSIS — Z7982 Long term (current) use of aspirin: Secondary | ICD-10-CM | POA: Diagnosis not present

## 2017-10-15 DIAGNOSIS — K219 Gastro-esophageal reflux disease without esophagitis: Secondary | ICD-10-CM | POA: Diagnosis not present

## 2017-10-15 DIAGNOSIS — Z791 Long term (current) use of non-steroidal anti-inflammatories (NSAID): Secondary | ICD-10-CM | POA: Diagnosis not present

## 2017-10-15 DIAGNOSIS — E669 Obesity, unspecified: Secondary | ICD-10-CM | POA: Diagnosis not present

## 2017-10-15 DIAGNOSIS — Z6831 Body mass index (BMI) 31.0-31.9, adult: Secondary | ICD-10-CM | POA: Diagnosis not present

## 2018-01-06 DIAGNOSIS — R69 Illness, unspecified: Secondary | ICD-10-CM | POA: Diagnosis not present

## 2018-01-24 ENCOUNTER — Other Ambulatory Visit: Payer: Self-pay | Admitting: Internal Medicine

## 2018-01-27 NOTE — Progress Notes (Signed)
Subjective:   Kayla Craig is a 82 y.o. female who presents for Medicare Annual (Subsequent) preventive examination.  Review of Systems:  No ROS.  Medicare Wellness Visit. Additional risk factors are reflected in the social history.  Cardiac Risk Factors include: advanced age (>12men, >68 women);hypertension;dyslipidemia Sleep patterns: gets up 1-2 times nightly to void and sleeps hours varies nightly. If apartment complex is not noisy, I sleep well.   Home Safety/Smoke Alarms: Feels safe in home. Smoke alarms in place.  Living environment; residence and Firearm Safety: apartment, no firearms. Lives alone, no needs for DME, good support system Seat Belt Safety/Bike Helmet: Wears seat belt.      Objective:     Vitals: BP (!) 158/84   Pulse 72   Resp 18   Ht 5\' 4"  (1.626 m)   Wt 185 lb (83.9 kg)   SpO2 98%   BMI 31.76 kg/m   Body mass index is 31.76 kg/m.  Advanced Directives 01/28/2018 01/25/2017 12/14/2014 12/27/2012  Does Patient Have a Medical Advance Directive? Yes Yes Yes Patient does not have advance directive  Type of Advance Directive Healthcare Power of [place];Living will Healthcare Power of [place];Living will - -  Copy of Healthcare Power of Attorney in Chart? No - copy requested No - copy requested Yes -  Pre-existing out of facility DNR order (yellow form or pink MOST form) - - - No    Tobacco Social History   Tobacco Use  Smoking Status Never Smoker  Smokeless Tobacco Never Used     Counseling given: Not Answered  Past Medical History:  Diagnosis Date  . B12 deficiency   . Back pain    Chronic  . Goiter   . 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 Hypertension   . TMJ (dislocation of temporomandibular joint)    Right Side  . Vitamin D deficiency    Past Surgical History:  Procedure Laterality Date  . EYE SURGERY  2010   cataract surgery with implants  . KNEE SURGERY     Right Arthroscopy '05 Dr Charlann Boxer  . THYROIDECTOMY     August '09 for goiter (Dr Jearld Fenton)  . TUBAL LIGATION     Family History  Problem Relation Age of Onset  . Cancer Mother   . Heart disease Father        CAD/MI  . Heart disease Brother        CAD/MI  . Heart disease Brother        CAD/MI  . Cancer Brother   . Diabetes Neg Hx    Social History   Socioeconomic History  . Marital status: Single    Spouse name: Not on file  . Number of children: 2  . Years of education: 80  . Highest education level: Not on file  Occupational History  . Occupation: retired    Associate Professor: RETIRED  Social Needs  . Financial resource strain: Not hard at all  . Food insecurity:    Worry: Never true    Inability: Never true  . Transportation needs:    Medical: No    Non-medical: No  Tobacco Use  . Smoking status: Never Smoker  . Smokeless tobacco: Never Used  Substance and Sexual Activity  . Alcohol use: No    Alcohol/week: 0.0 standard drinks  . Drug use: Never  . Sexual activity: Never  Lifestyle  . Physical activity:    Days per week: 0 days  Minutes per session: 0 min  . Stress: Not at all  Relationships  . Social connections:    Talks on phone: More than three times a week    Gets together: More than three times a week    Attends religious service: Never    Active member of club or organization: Yes    Attends meetings of clubs or organizations: More than 4 times per year    Relationship status: Not on file  Other Topics Concern  . Not on file  Social History Narrative   HSG. [place]. 2 sons- Aug 29, 2060 (died lung cancer 08-29-22), Aug 30, 2067; 3 grandchildren. 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 End of life issues: patient would want CPR, DNI, not to be maintained in a vegative state. Laymen's guide packet provided (july '11)    Outpatient Encounter Medications as of 01/28/2018  Medication Sig  . aspirin 81 MG EC tablet Take 81 mg by mouth  daily.    . Calcium Carbonate-Vit D-Min (CALCIUM 1200 PO) Take by mouth.  . Cholecalciferol (VITAMIN D-3) 1000 UNITS CAPS Take 1 capsule by mouth daily.  Marland Kitchen levothyroxine (SYNTHROID, LEVOTHROID) 50 MCG tablet TAKE 1 TABLET BY MOUTH ONCE DAILY BEFORE BREAKFAST. NEED ANNUAL APPOINTMENT FOR FURTHER REFILLS  . losartan (COZAAR) 100 MG tablet Take 1 tablet (100 mg total) by mouth daily.  . nebivolol (BYSTOLIC) 10 MG tablet Take 1 tablet (10 mg total) by mouth daily.  Marland Kitchen omega-3 acid ethyl esters (LOVAZA) 1 G capsule Take 2 capsules (2 g total) by mouth 2 (two) times daily.  . pantoprazole (PROTONIX) 40 MG tablet Take 1 tablet (40 mg total) by mouth daily.  . vitamin B-12 (CYANOCOBALAMIN) 1000 MCG tablet Take 1,000 mcg by mouth daily.    Marland Kitchen amLODipine (NORVASC) 10 MG tablet Take 1 tablet (10 mg total) by mouth daily.  . [DISCONTINUED] traMADol (ULTRAM) 50 MG tablet Take 1 tablet (50 mg total) by mouth every 8 (eight) hours as needed. (Patient not taking: Reported on 01/28/2018)   No facility-administered encounter medications on file as of 01/28/2018.     Activities of Daily Living In your present state of health, do you have any difficulty performing the following activities: 01/28/2018  Hearing? N  Vision? N  Difficulty concentrating or making decisions? N  Walking or climbing stairs? N  Dressing or bathing? N  Doing errands, shopping? N  Preparing Food and eating ? N  Using the Toilet? N  In the past six months, have you accidently leaked urine? N  Do you have problems with loss of bowel control? N  Managing your Medications? N  Managing your Finances? N  Housekeeping or managing your Housekeeping? N  Some recent data might be hidden    Patient Care Team: Myrlene Broker, MD as PCP - General (Internal Medicine) Nelson Chimes, MD (Ophthalmology) Durene Romans, MD (Orthopedic Surgery)    Assessment:   This is a routine wellness examination for Kayla Craig. Physical assessment deferred  to PCP.   Exercise Activities and Dietary recommendations Current Exercise Habits: The patient does not participate in regular exercise at present, Exercise limited by: orthopedic condition(s)  Diet (meal preparation, eat out, water intake, caffeinated beverages, dairy products, fruits and vegetables): in general, a "healthy" diet  , well balanced   Reviewed heart healthy and diabetic diet. Encouraged patient to increase daily water and healthy fluid intake. Diet education was attached to patient's AVS.  Goals    .  Cut out extra servings     Cut back on what I eat; discussed plate method for portion  Monitor sodium; suggest monitoring food intake with bp checks for awhile     . Patient Stated     Stay as healthy and as independent as possible.     . Stay as healthy and active as possible     Increase physical activity and monitor diet closer.       Fall Risk Fall Risk  01/28/2018 01/25/2017 01/10/2016 12/14/2014  Falls in the past year? No Yes Yes No  Number falls in past yr: - 1 1 -  Injury with Fall? - No No -  Risk for fall due to : Impaired mobility - - -  Follow up - Falls prevention discussed - -    Depression Screen PHQ 2/9 Scores 01/28/2018 01/25/2017 01/10/2016 12/14/2014  PHQ - 2 Score 0 0 0 0  PHQ- 9 Score - 1 - -     Cognitive Function MMSE - Mini Mental State Exam 01/28/2018 12/14/2014  Not completed: - (No Data)  Orientation to time 5 -  Orientation to Place 5 -  Registration 3 -  Attention/ Calculation 4 -  Recall 1 -  Language- name 2 objects 2 -  Language- repeat 1 -  Language- follow 3 step command 3 -  Language- read & follow direction 1 -  Write a sentence 1 -  Copy design 1 -  Total score 27 -        Immunization History  Administered Date(s) Administered  . Influenza Whole 01/31/2009, 02/28/2012  . Influenza, High Dose Seasonal PF 01/06/2018  . Influenza,inj,Quad PF,6+ Mos 01/11/2014  . Influenza-Unspecified 01/29/2013, 12/22/2015,  12/08/2016  . Pneumococcal Conjugate-13 12/14/2014  . Pneumococcal Polysaccharide-23 05/08/2012  . Tetanus 05/08/2012   Screening Tests Health Maintenance  Topic Date Due  . TETANUS/TDAP  05/08/2022  . INFLUENZA VACCINE  Completed  . DEXA SCAN  Completed  . PNA vac Low Risk Adult  Completed      Plan:      Continue doing brain stimulating activities (puzzles, reading, adult coloring books, staying active) to keep memory sharp.   Continue to eat heart healthy diet (full of fruits, vegetables, whole grains, lean protein, water--limit salt, fat, and sugar intake) and increase physical activity as tolerated.  I have personally reviewed and noted the following in the patient's chart:   . Medical and social history . Use of alcohol, tobacco or illicit drugs  . Current medications and supplements . Functional ability and status . Nutritional status . Physical activity . Advanced directives . List of other physicians . Vitals . Screenings to include cognitive, depression, and falls . Referrals and appointments  In addition, I have reviewed and discussed with patient certain preventive protocols, quality metrics, and best practice recommendations. A written personalized care plan for preventive services as well as general preventive health recommendations were provided to patient.     Wanda Plump, RN  01/28/2018

## 2018-01-28 ENCOUNTER — Ambulatory Visit (INDEPENDENT_AMBULATORY_CARE_PROVIDER_SITE_OTHER): Payer: Medicare HMO | Admitting: *Deleted

## 2018-01-28 VITALS — BP 158/84 | HR 72 | Resp 18 | Ht 64.0 in | Wt 185.0 lb

## 2018-01-28 DIAGNOSIS — Z0000 Encounter for general adult medical examination without abnormal findings: Secondary | ICD-10-CM

## 2018-01-28 NOTE — Progress Notes (Signed)
Medical screening examination/treatment/procedure(s) were performed by non-physician practitioner and as supervising physician I was immediately available for consultation/collaboration. I agree with above. Izabel Chim A Shephanie Romas, MD 

## 2018-01-28 NOTE — Patient Instructions (Addendum)
Continue doing brain stimulating activities (puzzles, reading, adult coloring books, staying active) to keep memory sharp.   Continue to eat heart healthy diet (full of fruits, vegetables, whole grains, lean protein, water--limit salt, fat, and sugar intake) and increase physical activity as tolerated.   [name] , Thank you for taking time to come for your Medicare Wellness Visit. I appreciate your ongoing commitment to your health goals. Please review the following plan we discussed and let me know if I can assist you in the future.   These are the goals we discussed: Goals    . Cut out extra servings     Cut back on what I eat; discussed plate method for portion  Monitor sodium; suggest monitoring food intake with bp checks for awhile     . Patient Stated     Stay as healthy and as independent as possible.     . Stay as healthy and active as possible     Increase physical activity and monitor diet closer.       This is a list of the screening recommended for you and due dates:  Health Maintenance  Topic Date Due  . Tetanus Vaccine  05/08/2022  . Flu Shot  Completed  . DEXA scan (bone density measurement)  Completed  . Pneumonia vaccines  Completed   Health Maintenance, Female Adopting a healthy lifestyle and getting preventive care can go a long way to promote health and wellness. Talk with your health care provider about what schedule of regular examinations is right for you. This is a good chance for you to check in with your provider about disease prevention and staying healthy. In between checkups, there are plenty of things you can do on your own. Experts have done a lot of research about which lifestyle changes and preventive measures are most likely to keep you healthy. Ask your health care provider for more information. Weight and diet Eat a healthy diet  Be sure to include plenty of vegetables, fruits, low-fat dairy products, and lean protein.  Do not eat a lot of  foods high in solid fats, added sugars, or salt.  Get regular exercise. This is one of the most important things you can do for your health. ? Most adults should exercise for at least 150 minutes each week. The exercise should increase your heart rate and make you sweat (moderate-intensity exercise). ? Most adults should also do strengthening exercises at least twice a week. This is in addition to the moderate-intensity exercise.  Maintain a healthy weight  Body mass index (BMI) is a measurement that can be used to identify possible weight problems. It estimates body fat based on height and weight. Your health care provider can help determine your BMI and help you achieve or maintain a healthy weight.  For females 29 years of age and older: ? A BMI below 18.5 is considered underweight. ? A BMI of 18.5 to 24.9 is normal. ? A BMI of 25 to 29.9 is considered overweight. ? A BMI of 30 and above is considered obese.  Watch levels of cholesterol and blood lipids  You should start having your blood tested for lipids and cholesterol at 80 years of age, then have this test every 5 years.  You may need to have your cholesterol levels checked more often if: ? Your lipid or cholesterol levels are high. ? You are older than 80 years of age. ? You are at high risk for heart disease.  Cancer  screening Lung Cancer  Lung cancer screening is recommended for adults 9-72 years old who are at high risk for lung cancer because of a history of smoking.  A yearly low-dose CT scan of the lungs is recommended for people who: ? Currently smoke. ? Have quit within the past 15 years. ? Have at least a 30-pack-year history of smoking. A pack year is smoking an average of one pack of cigarettes a day for 1 year.  Yearly screening should continue until it has been 15 years since you quit.  Yearly screening should stop if you develop a health problem that would prevent you from having lung cancer  treatment.  Breast Cancer  Practice breast self-awareness. This means understanding how your breasts normally appear and feel.  It also means doing regular breast self-exams. Let your health care provider know about any changes, no matter how small.  If you are in your 20s or 30s, you should have a clinical breast exam (CBE) by a health care provider every 1-3 years as part of a regular health exam.  If you are 56 or older, have a CBE every year. Also consider having a breast X-ray (mammogram) every year.  If you have a family history of breast cancer, talk to your health care provider about genetic screening.  If you are at high risk for breast cancer, talk to your health care provider about having an MRI and a mammogram every year.  Breast cancer gene (BRCA) assessment is recommended for women who have family members with BRCA-related cancers. BRCA-related cancers include: ? Breast. ? Ovarian. ? Tubal. ? Peritoneal cancers.  Results of the assessment will determine the need for genetic counseling and BRCA1 and BRCA2 testing.  Cervical Cancer Your health care provider may recommend that you be screened regularly for cancer of the pelvic organs (ovaries, uterus, and vagina). This screening involves a pelvic examination, including checking for microscopic changes to the surface of your cervix (Pap test). You may be encouraged to have this screening done every 3 years, beginning at age 29.  For women ages 30-65, health care providers may recommend pelvic exams and Pap testing every 3 years, or they may recommend the Pap and pelvic exam, combined with testing for human papilloma virus (HPV), every 5 years. Some types of HPV increase your risk of cervical cancer. Testing for HPV may also be done on women of any age with unclear Pap test results.  Other health care providers may not recommend any screening for nonpregnant women who are considered low risk for pelvic cancer and who do not have  symptoms. Ask your health care provider if a screening pelvic exam is right for you.  If you have had past treatment for cervical cancer or a condition that could lead to cancer, you need Pap tests and screening for cancer for at least 20 years after your treatment. If Pap tests have been discontinued, your risk factors (such as having a new sexual partner) need to be reassessed to determine if screening should resume. Some women have medical problems that increase the chance of getting cervical cancer. In these cases, your health care provider may recommend more frequent screening and Pap tests.  Colorectal Cancer  This type of cancer can be detected and often prevented.  Routine colorectal cancer screening usually begins at 80 years of age and continues through 80 years of age.  Your health care provider may recommend screening at an earlier age if you have risk factors for  colon cancer.  Your health care provider may also recommend using home test kits to check for hidden blood in the stool.  A small camera at the end of a tube can be used to examine your colon directly (sigmoidoscopy or colonoscopy). This is done to check for the earliest forms of colorectal cancer.  Routine screening usually begins at age 28.  Direct examination of the colon should be repeated every 5-10 years through 80 years of age. However, you may need to be screened more often if early forms of precancerous polyps or small growths are found.  Skin Cancer  Check your skin from head to toe regularly.  Tell your health care provider about any new moles or changes in moles, especially if there is a change in a mole's shape or color.  Also tell your health care provider if you have a mole that is larger than the size of a pencil eraser.  Always use sunscreen. Apply sunscreen liberally and repeatedly throughout the day.  Protect yourself by wearing long sleeves, pants, a wide-brimmed hat, and sunglasses whenever you  are outside.  Heart disease, diabetes, and high blood pressure  High blood pressure causes heart disease and increases the risk of stroke. High blood pressure is more likely to develop in: ? People who have blood pressure in the high end of the normal range (130-139/85-89 mm Hg). ? People who are overweight or obese. ? People who are African American.  If you are 28-103 years of age, have your blood pressure checked every 3-5 years. If you are 46 years of age or older, have your blood pressure checked every year. You should have your blood pressure measured twice-once when you are at a hospital or clinic, and once when you are not at a hospital or clinic. Record the average of the two measurements. To check your blood pressure when you are not at a hospital or clinic, you can use: ? An automated blood pressure machine at a pharmacy. ? A home blood pressure monitor.  If you are between 78 years and 7 years old, ask your health care provider if you should take aspirin to prevent strokes.  Have regular diabetes screenings. This involves taking a blood sample to check your fasting blood sugar level. ? If you are at a normal weight and have a low risk for diabetes, have this test once every three years after 80 years of age. ? If you are overweight and have a high risk for diabetes, consider being tested at a younger age or more often. Preventing infection Hepatitis B  If you have a higher risk for hepatitis B, you should be screened for this virus. You are considered at high risk for hepatitis B if: ? You were born in a country where hepatitis B is common. Ask your health care provider which countries are considered high risk. ? Your parents were born in a high-risk country, and you have not been immunized against hepatitis B (hepatitis B vaccine). ? You have HIV or AIDS. ? You use needles to inject street drugs. ? You live with someone who has hepatitis B. ? You have had sex with someone who  has hepatitis B. ? You get hemodialysis treatment. ? You take certain medicines for conditions, including cancer, organ transplantation, and autoimmune conditions.  Hepatitis C  Blood testing is recommended for: ? Everyone born from [birth year] through 1963. ? Anyone with known risk factors for hepatitis C.  Sexually transmitted infections (STIs)  You  should be screened for sexually transmitted infections (STIs) including gonorrhea and chlamydia if: ? You are sexually active and are younger than 80 years of age. ? You are older than 80 years of age and your health care provider tells you that you are at risk for this type of infection. ? Your sexual activity has changed since you were last screened and you are at an increased risk for chlamydia or gonorrhea. Ask your health care provider if you are at risk.  If you do not have HIV, but are at risk, it may be recommended that you take a prescription medicine daily to prevent HIV infection. This is called pre-exposure prophylaxis (PrEP). You are considered at risk if: ? You are sexually active and do not regularly use condoms or know the HIV status of your partner(s). ? You take drugs by injection. ? You are sexually active with a partner who has HIV.  Talk with your health care provider about whether you are at high risk of being infected with HIV. If you choose to begin PrEP, you should first be tested for HIV. You should then be tested every 3 months for as long as you are taking PrEP. Pregnancy  If you are premenopausal and you may become pregnant, ask your health care provider about preconception counseling.  If you may become pregnant, take 400 to 800 micrograms (mcg) of folic acid every day.  If you want to prevent pregnancy, talk to your health care provider about birth control (contraception). Osteoporosis and menopause  Osteoporosis is a disease in which the bones lose minerals and strength with aging. This can result in serious bone  fractures. Your risk for osteoporosis can be identified using a bone density scan.  If you are 14 years of age or older, or if you are at risk for osteoporosis and fractures, ask your health care provider if you should be screened.  Ask your health care provider whether you should take a calcium or vitamin D supplement to lower your risk for osteoporosis.  Menopause may have certain physical symptoms and risks.  Hormone replacement therapy may reduce some of these symptoms and risks. Talk to your health care provider about whether hormone replacement therapy is right for you. Follow these instructions at home:  Schedule regular health, dental, and eye exams.  Stay current with your immunizations.  Do not use any tobacco products including cigarettes, chewing tobacco, or electronic cigarettes.  If you are pregnant, do not drink alcohol.  If you are breastfeeding, limit how much and how often you drink alcohol.  Limit alcohol intake to no more than 1 drink per day for nonpregnant women. One drink equals 12 ounces of beer, 5 ounces of Bryce Cheever, or 1 ounces of hard liquor.  Do not use street drugs.  Do not share needles.  Ask your health care provider for help if you need support or information about quitting drugs.  Tell your health care provider if you often feel depressed.  Tell your health care provider if you have ever been abused or do not feel safe at home. This information is not intended to replace advice given to you by your health care provider. Make sure you discuss any questions you have with your health care provider. Document Released: 10/09/2010 Document Revised: 09/01/2015 Document Reviewed: 12/28/2014 Elsevier Interactive Patient Education  2018 [employer].   Fat and Cholesterol Restricted Diet Getting too much fat and cholesterol in your diet may cause health problems. Following this diet  helps keep your fat and cholesterol at normal levels. This can keep you  from getting sick. What types of fat should I choose?  Choose monosaturated and polyunsaturated fats. These are found in foods such as olive oil, canola oil, flaxseeds, walnuts, almonds, and seeds.  Eat more omega-3 fats. Good choices include salmon, mackerel, sardines, tuna, flaxseed oil, and ground flaxseeds.  Limit saturated fats. These are in animal products such as meats, butter, and cream. They can also be in plant products such as palm oil, palm kernel oil, and coconut oil.  Avoid foods with partially hydrogenated oils in them. These contain trans fats. Examples of foods that have trans fats are stick margarine, some tub margarines, cookies, crackers, and other baked goods. What general guidelines do I need to follow?  Check food labels. Look for the words "trans fat" and "saturated fat."  When preparing a meal: ? Fill half of your plate with vegetables and green salads. ? Fill one fourth of your plate with whole grains. Look for the word "whole" as the first word in the ingredient list. ? Fill one fourth of your plate with lean protein foods.  Eat more foods that have fiber, like apples, carrots, beans, peas, and barley.  Eat more home-cooked foods. Eat less at restaurants and buffets.  Limit or avoid alcohol.  Limit foods high in starch and sugar.  Limit fried foods.  Cook foods without frying them. Baking, boiling, grilling, and broiling are all great options.  Lose weight if you are overweight. Losing even a small amount of weight can help your overall health. It can also help prevent diseases such as diabetes and heart disease. What foods can I eat? Grains Whole grains, such as whole wheat or whole grain breads, crackers, cereals, and pasta. Unsweetened oatmeal, bulgur, barley, quinoa, or brown rice. Corn or whole wheat flour tortillas. Vegetables Fresh or frozen vegetables (raw, steamed, roasted, or grilled). Green salads. Fruits All fresh, canned (in natural  juice), or frozen fruits. Meat and Other Protein Products Ground beef (85% or leaner), grass-fed beef, or beef trimmed of fat. Skinless chicken or [place]. Ground chicken or [place]. Pork trimmed of fat. All fish and seafood. Eggs. Dried beans, peas, or lentils. Unsalted nuts or seeds. Unsalted canned or dry beans. Dairy Low-fat dairy products, such as skim or 1% milk, 2% or reduced-fat cheeses, low-fat ricotta or cottage cheese, or plain low-fat yogurt. Fats and Oils Tub margarines without trans fats. Light or reduced-fat mayonnaise and salad dressings. Avocado. Olive, canola, sesame, or safflower oils. Natural peanut or almond butter (choose ones without added sugar and oil). The items listed above may not be a complete list of recommended foods or beverages. Contact your dietitian for more options. What foods are not recommended? Grains White bread. White pasta. White rice. Cornbread. Bagels, pastries, and croissants. Crackers that contain trans fat. Vegetables White potatoes. Corn. Creamed or fried vegetables. Vegetables in a cheese sauce. Fruits Dried fruits. Canned fruit in light or heavy syrup. Fruit juice. Meat and Other Protein Products Fatty cuts of meat. Ribs, chicken wings, bacon, sausage, bologna, salami, chitterlings, fatback, hot dogs, bratwurst, and packaged luncheon meats. Liver and organ meats. Dairy Whole or 2% milk, cream, half-and-half, and cream cheese. Whole milk cheeses. Whole-fat or sweetened yogurt. Full-fat cheeses. Nondairy creamers and whipped toppings. Processed cheese, cheese spreads, or cheese curds. Sweets and Desserts Corn syrup, sugars, honey, and molasses. Candy. Jam and jelly. Syrup. Sweetened cereals. Cookies, pies, cakes, donuts, muffins, and ice cream.  Fats and Oils Butter, stick margarine, lard, shortening, ghee, or bacon fat. Coconut, palm kernel, or palm oils. Beverages Alcohol. Sweetened drinks (such as sodas, lemonade, and fruit drinks or  punches). The items listed above may not be a complete list of foods and beverages to avoid. Contact your dietitian for more information. This information is not intended to replace advice given to you by your health care provider. Make sure you discuss any questions you have with your health care provider. Document Released: 09/25/2011 Document Revised: 12/01/2015 Document Reviewed: 06/25/2013 Elsevier Interactive Patient Education  2018 [employer].  Carbohydrate Counting for Diabetes Mellitus, Adult Carbohydrate counting is a method for keeping track of how many carbohydrates you eat. Eating carbohydrates naturally increases the amount of sugar (glucose) in the blood. Counting how many carbohydrates you eat helps keep your blood glucose within normal limits, which helps you manage your diabetes (diabetes mellitus). It is important to know how many carbohydrates you can safely have in each meal. This is different for every person. A diet and nutrition specialist (registered dietitian) can help you make a meal plan and calculate how many carbohydrates you should have at each meal and snack. Carbohydrates are found in the following foods:  Grains, such as breads and cereals.  Dried beans and soy products.  Starchy vegetables, such as potatoes, peas, and corn.  Fruit and fruit juices.  Milk and yogurt.  Sweets and snack foods, such as cake, cookies, candy, chips, and soft drinks.  How do I count carbohydrates? There are two ways to count carbohydrates in food. You can use either of the methods or a combination of both. Reading "Nutrition Facts" on packaged food The "Nutrition Facts" list is included on the labels of almost all packaged foods and beverages in the [place] It includes:  The serving size.  Information about nutrients in each serving, including the grams (g) of carbohydrate per serving.  To use the "Nutrition Facts":  Decide how many servings you will have.  Multiply the  number of servings by the number of carbohydrates per serving.  The resulting number is the total amount of carbohydrates that you will be having.  Learning standard serving sizes of other foods When you eat foods containing carbohydrates that are not packaged or do not include "Nutrition Facts" on the label, you need to measure the servings in order to count the amount of carbohydrates:  Measure the foods that you will eat with a food scale or measuring cup, if needed.  Decide how many standard-size servings you will eat.  Multiply the number of servings by 15. Most carbohydrate-rich foods have about 15 g of carbohydrates per serving. ? For example, if you eat 8 oz (170 g) of strawberries, you will have eaten 2 servings and 30 g of carbohydrates (2 servings x 15 g = 30 g).  For foods that have more than one food mixed, such as soups and casseroles, you must count the carbohydrates in each food that is included.  The following list contains standard serving sizes of common carbohydrate-rich foods. Each of these servings has about 15 g of carbohydrates:   hamburger bun or  English muffin.   oz (15 mL) syrup.   oz (14 g) jelly.  1 slice of bread.  1 six-inch tortilla.  3 oz (85 g) cooked rice or pasta.  4 oz (113 g) cooked dried beans.  4 oz (113 g) starchy vegetable, such as peas, corn, or potatoes.  4 oz (113 g)  hot cereal.  4 oz (113 g) mashed potatoes or  of a large baked potato.  4 oz (113 g) canned or frozen fruit.  4 oz (120 mL) fruit juice.  4-6 crackers.  6 chicken nuggets.  6 oz (170 g) unsweetened dry cereal.  6 oz (170 g) plain fat-free yogurt or yogurt sweetened with artificial sweeteners.  8 oz (240 mL) milk.  8 oz (170 g) fresh fruit or one small piece of fruit.  24 oz (680 g) popped popcorn.  Example of carbohydrate counting Sample meal  3 oz (85 g) chicken breast.  6 oz (170 g) brown rice.  4 oz (113 g) corn.  8 oz (240 mL)  milk.  8 oz (170 g) strawberries with sugar-free whipped topping. Carbohydrate calculation 1. Identify the foods that contain carbohydrates: ? Rice. ? Corn. ? Milk. ? Strawberries. 2. Calculate how many servings you have of each food: ? 2 servings rice. ? 1 serving corn. ? 1 serving milk. ? 1 serving strawberries. 3. Multiply each number of servings by 15 g: ? 2 servings rice x 15 g = 30 g. ? 1 serving corn x 15 g = 15 g. ? 1 serving milk x 15 g = 15 g. ? 1 serving strawberries x 15 g = 15 g. 4. Add together all of the amounts to find the total grams of carbohydrates eaten: ? 30 g + 15 g + 15 g + 15 g = 75 g of carbohydrates total. This information is not intended to replace advice given to you by your health care provider. Make sure you discuss any questions you have with your health care provider. Document Released: 03/26/2005 Document Revised: 10/14/2015 Document Reviewed: 09/07/2015 Elsevier Interactive Patient Education  2018 [employer].  Diabetes Mellitus and Nutrition When you have diabetes (diabetes mellitus), it is very important to have healthy eating habits because your blood sugar (glucose) levels are greatly affected by what you eat and drink. Eating healthy foods in the appropriate amounts, at about the same times every day, can help you:  Control your blood glucose.  Lower your risk of heart disease.  Improve your blood pressure.  Reach or maintain a healthy weight.  Every person with diabetes is different, and each person has different needs for a meal plan. Your health care provider may recommend that you work with a diet and nutrition specialist (dietitian) to make a meal plan that is best for you. Your meal plan may vary depending on factors such as:  The calories you need.  The medicines you take.  Your weight.  Your blood glucose, blood pressure, and cholesterol levels.  Your activity level.  Other health conditions you have, such as heart or  kidney disease.  How do carbohydrates affect me? Carbohydrates affect your blood glucose level more than any other type of food. Eating carbohydrates naturally increases the amount of glucose in your blood. Carbohydrate counting is a method for keeping track of how many carbohydrates you eat. Counting carbohydrates is important to keep your blood glucose at a healthy level, especially if you use insulin or take certain oral diabetes medicines. It is important to know how many carbohydrates you can safely have in each meal. This is different for every person. Your dietitian can help you calculate how many carbohydrates you should have at each meal and for snack. Foods that contain carbohydrates include:  Bread, cereal, rice, pasta, and crackers.  Potatoes and corn.  Peas, beans, and lentils.  Milk  and yogurt.  Fruit and juice.  Desserts, such as cakes, cookies, ice cream, and candy.  How does alcohol affect me? Alcohol can cause a sudden decrease in blood glucose (hypoglycemia), especially if you use insulin or take certain oral diabetes medicines. Hypoglycemia can be a life-threatening condition. Symptoms of hypoglycemia (sleepiness, dizziness, and confusion) are similar to symptoms of having too much alcohol. If your health care provider says that alcohol is safe for you, follow these guidelines:  Limit alcohol intake to no more than 1 drink per day for nonpregnant women and 2 drinks per day for men. One drink equals 12 oz of beer, 5 oz of Nnaemeka Samson, or 1 oz of hard liquor.  Do not drink on an empty stomach.  Keep yourself hydrated with water, diet soda, or unsweetened iced tea.  Keep in mind that regular soda, juice, and other mixers may contain a lot of sugar and must be counted as carbohydrates.  What are tips for following this plan? Reading food labels  Start by checking the serving size on the label. The amount of calories, carbohydrates, fats, and other nutrients listed on the  label are based on one serving of the food. Many foods contain more than one serving per package.  Check the total grams (g) of carbohydrates in one serving. You can calculate the number of servings of carbohydrates in one serving by dividing the total carbohydrates by 15. For example, if a food has 30 g of total carbohydrates, it would be equal to 2 servings of carbohydrates.  Check the number of grams (g) of saturated and trans fats in one serving. Choose foods that have low or no amount of these fats.  Check the number of milligrams (mg) of sodium in one serving. Most people should limit total sodium intake to less than 2,300 mg per day.  Always check the nutrition information of foods labeled as "low-fat" or "nonfat". These foods may be higher in added sugar or refined carbohydrates and should be avoided.  Talk to your dietitian to identify your daily goals for nutrients listed on the label. Shopping  Avoid buying canned, premade, or processed foods. These foods tend to be high in fat, sodium, and added sugar.  Shop around the outside edge of the grocery store. This includes fresh fruits and vegetables, bulk grains, fresh meats, and fresh dairy. Cooking  Use low-heat cooking methods, such as baking, instead of high-heat cooking methods like deep frying.  Cook using healthy oils, such as olive, canola, or sunflower oil.  Avoid cooking with butter, cream, or high-fat meats. Meal planning  Eat meals and snacks regularly, preferably at the same times every day. Avoid going long periods of time without eating.  Eat foods high in fiber, such as fresh fruits, vegetables, beans, and whole grains. Talk to your dietitian about how many servings of carbohydrates you can eat at each meal.  Eat 4-6 ounces of lean protein each day, such as lean meat, chicken, fish, eggs, or tofu. 1 ounce is equal to 1 ounce of meat, chicken, or fish, 1 egg, or 1/4 cup of tofu.  Eat some foods each day that  contain healthy fats, such as avocado, nuts, seeds, and fish. Lifestyle   Check your blood glucose regularly.  Exercise at least 30 minutes 5 or more days each week, or as told by your health care provider.  Take medicines as told by your health care provider.  Do not use any products that contain nicotine or tobacco,  such as cigarettes and e-cigarettes. If you need help quitting, ask your health care provider.  Work with a Social worker or diabetes educator to identify strategies to manage stress and any emotional and social challenges. What are some questions to ask my health care provider?  Do I need to meet with a diabetes educator?  Do I need to meet with a dietitian?  What number can I call if I have questions?  When are the best times to check my blood glucose? Where to find more information:  American Diabetes Association: diabetes.org/food-and-fitness/food  Academy of Nutrition and Dietetics: PokerClues.dk  [employer] of Diabetes and Digestive and Kidney Diseases (NIH): ContactWire.be Summary  A healthy meal plan will help you control your blood glucose and maintain a healthy lifestyle.  Working with a diet and nutrition specialist (dietitian) can help you make a meal plan that is best for you.  Keep in mind that carbohydrates and alcohol have immediate effects on your blood glucose levels. It is important to count carbohydrates and to use alcohol carefully. This information is not intended to replace advice given to you by your health care provider. Make sure you discuss any questions you have with your health care provider. Document Released: 12/21/2004 Document Revised: 04/30/2016 Document Reviewed: 04/30/2016 Elsevier Interactive Patient Education  [employer].

## 2018-02-11 ENCOUNTER — Other Ambulatory Visit: Payer: Self-pay | Admitting: Internal Medicine

## 2018-02-12 ENCOUNTER — Encounter: Payer: Self-pay | Admitting: Internal Medicine

## 2018-02-12 ENCOUNTER — Ambulatory Visit (INDEPENDENT_AMBULATORY_CARE_PROVIDER_SITE_OTHER): Payer: Medicare HMO | Admitting: Internal Medicine

## 2018-02-12 ENCOUNTER — Other Ambulatory Visit (INDEPENDENT_AMBULATORY_CARE_PROVIDER_SITE_OTHER): Payer: Medicare HMO

## 2018-02-12 VITALS — BP 144/90 | HR 71 | Temp 98.4°F | Ht 64.0 in | Wt 185.0 lb

## 2018-02-12 DIAGNOSIS — E039 Hypothyroidism, unspecified: Secondary | ICD-10-CM

## 2018-02-12 DIAGNOSIS — Z0000 Encounter for general adult medical examination without abnormal findings: Secondary | ICD-10-CM | POA: Diagnosis not present

## 2018-02-12 DIAGNOSIS — E538 Deficiency of other specified B group vitamins: Secondary | ICD-10-CM | POA: Diagnosis not present

## 2018-02-12 DIAGNOSIS — E781 Pure hyperglyceridemia: Secondary | ICD-10-CM

## 2018-02-12 DIAGNOSIS — I10 Essential (primary) hypertension: Secondary | ICD-10-CM

## 2018-02-12 LAB — LIPID PANEL
Cholesterol: 252 mg/dL — ABNORMAL HIGH (ref 0–200)
HDL: 40.6 mg/dL (ref >39.00)
Total CHOL/HDL Ratio: 6
Triglycerides: 644 mg/dL — ABNORMAL HIGH (ref 0.0–149.0)

## 2018-02-12 LAB — COMPREHENSIVE METABOLIC PANEL
ALBUMIN: 4.2 g/dL (ref 3.5–5.2)
ALT: 19 U/L (ref 0–35)
AST: 17 U/L (ref 0–37)
Alkaline Phosphatase: 54 U/L (ref 39–117)
BUN: 23 mg/dL (ref 6–23)
CO2: 23 meq/L (ref 19–32)
CREATININE: 1.05 mg/dL (ref 0.40–1.20)
Calcium: 9.6 mg/dL (ref 8.4–10.5)
Chloride: 104 mEq/L (ref 96–112)
GFR: 53.55 mL/min — ABNORMAL LOW (ref >60.00)
Glucose, Bld: 178 mg/dL — ABNORMAL HIGH (ref 70–99)
Potassium: 3.7 mEq/L (ref 3.5–5.1)
SODIUM: 137 meq/L (ref 135–145)
Total Bilirubin: 0.6 mg/dL (ref 0.2–1.2)
Total Protein: 7.6 g/dL (ref 6.0–8.3)

## 2018-02-12 LAB — TSH: TSH: 1.58 u[IU]/mL (ref 0.35–4.50)

## 2018-02-12 LAB — VITAMIN B12: VITAMIN B 12: 589 pg/mL (ref 211–911)

## 2018-02-12 LAB — LDL CHOLESTEROL, DIRECT: LDL DIRECT: 103 mg/dL

## 2018-02-12 LAB — CBC
HCT: 40.5 % (ref 36.0–46.0)
Hemoglobin: 13.6 g/dL (ref 12.0–15.0)
MCHC: 33.6 g/dL (ref 30.0–36.0)
MCV: 85.9 fl (ref 78.0–100.0)
Platelets: 228 10*3/uL (ref 150.0–400.0)
RBC: 4.71 Mil/uL (ref 3.87–5.11)
RDW: 14.3 % (ref 11.5–15.5)
WBC: 5.5 10*3/uL (ref 4.0–10.5)

## 2018-02-12 LAB — HEMOGLOBIN A1C: Hgb A1c MFr Bld: 7 % — ABNORMAL HIGH (ref 4.6–6.5)

## 2018-02-12 LAB — T4, FREE: FREE T4: 0.75 ng/dL (ref 0.60–1.60)

## 2018-02-12 NOTE — Progress Notes (Signed)
   Subjective:    Patient ID: Kayla Craig, female    DOB: 1935-08-13, 82 y.o.   MRN: [id number]  HPI Here for medicare wellness and physical, no new complaints. Please see A/P for status and treatment of chronic medical problems.   Diet: heart healthy Physical activity: sedentary Depression/mood screen: negative Hearing: intact to whispered voice Visual acuity: grossly normal, performs annual eye exam  ADLs: capable Fall risk: none Home safety: good Cognitive evaluation: intact to orientation, naming, recall and repetition EOL planning: adv directives discussed  I have personally reviewed and have noted 1. The patient's medical and social history - reviewed today no changes 2. Their use of alcohol, tobacco or illicit drugs 3. Their current medications and supplements 4. The patient's functional ability including ADL's, fall risks, home safety risks and hearing or visual impairment. 5. Diet and physical activities 6. Evidence for depression or mood disorders 7. Care team reviewed and updated (available in snapshot)  Review of Systems  Constitutional: Negative.   HENT: Negative.   Eyes: Negative.   Respiratory: Negative for cough, chest tightness and shortness of breath.   Cardiovascular: Negative for chest pain, palpitations and leg swelling.  Gastrointestinal: Negative for abdominal distention, abdominal pain, constipation, diarrhea, nausea and vomiting.  Musculoskeletal: Negative.   Skin: Negative.   Neurological: Negative.   Psychiatric/Behavioral: Negative.       Objective:   Physical Exam  Constitutional: She is oriented to person, place, and time. She appears well-developed and well-nourished.  HENT:  Head: Normocephalic and atraumatic.  Eyes: EOM are normal.  Neck: Normal range of motion.  Cardiovascular: Normal rate and regular rhythm.  Pulmonary/Chest: Effort normal and breath sounds normal. No respiratory distress. She has no wheezes. She has no rales.    Abdominal: Soft. Bowel sounds are normal. She exhibits no distension. There is no tenderness. There is no rebound.  Musculoskeletal: She exhibits no edema.  Neurological: She is alert and oriented to person, place, and time. Coordination normal.  Skin: Skin is warm and dry.  Psychiatric: She has a normal mood and affect.   Vitals:   02/12/18 1019  BP: (!) 144/90  Pulse: 71  Temp: 98.4 F (36.9 C)  TempSrc: Oral  SpO2: 97%  Weight: 185 lb (83.9 kg)  Height: 5\' 4"  (1.626 m)      Assessment & Plan:

## 2018-02-12 NOTE — Patient Instructions (Signed)

## 2018-02-13 MED ORDER — ATENOLOL 50 MG PO TABS: 50.0000 mg | ORAL_TABLET | Freq: Every day | ORAL | 3 refills | Status: DC

## 2018-02-14 MED ORDER — AMLODIPINE BESYLATE 10 MG PO TABS: 10.0000 mg | ORAL_TABLET | Freq: Every day | ORAL | 3 refills | Status: DC

## 2018-02-14 MED ORDER — LEVOTHYROXINE SODIUM 50 MCG PO TABS: 50.0000 ug | ORAL_TABLET | Freq: Every day | ORAL | 3 refills | Status: DC

## 2018-02-14 MED ORDER — LOSARTAN POTASSIUM 100 MG PO TABS: 100.0000 mg | ORAL_TABLET | Freq: Every day | ORAL | 3 refills | Status: DC

## 2018-02-14 NOTE — Assessment & Plan Note (Signed)
Changing from bystolic to atenolol for cheaper price and keep amlodipine 10 mg daily and losartan 100 mg daily. She is not taking amlodipine regularly and encouraged her to do so to help with blood pressure control. Checking CMP and adjust as needed.

## 2018-02-14 NOTE — Assessment & Plan Note (Signed)
Checking B12 level and adjust as needed.  

## 2018-02-14 NOTE — Assessment & Plan Note (Signed)
Flu shot up to date. Pneumonia complete. Shingrix counseled. Tetanus up to date. Colonoscopy aged out. Mammogram aged out, pap smear aged out and dexa up to date. Counseled about sun safety and mole surveillance. Counseled about the dangers of distracted driving. Given 10 year screening recommendations.  

## 2018-02-14 NOTE — Assessment & Plan Note (Signed)
Checking lipid panel, taking fish oil otc. Adjust as needed.

## 2018-02-14 NOTE — Assessment & Plan Note (Signed)
Checking TSH and free T4 and adjust as needed synthroid 50 mcg daily.  

## 2018-02-17 ENCOUNTER — Telehealth: Payer: Self-pay | Admitting: Internal Medicine

## 2018-02-17 NOTE — Telephone Encounter (Unsigned)
Copied from CRM [id number]. Topic: General - Other >> Feb 17, 2018 12:32 PM Marylen Ponto wrote: Reason for CRM: Pt requests to speak with Dr. Para March nurse. Pt states she already takes amLODipine (NORVASC) 10 MG tablet for high blood pressure and she was told by Dr. Debby Bud to take fish oil pills for her cholesterol. Pt requests call back. Cb# [phone]

## 2018-02-18 ENCOUNTER — Other Ambulatory Visit: Payer: Self-pay | Admitting: Internal Medicine

## 2018-02-18 MED ORDER — ATORVASTATIN CALCIUM 40 MG PO TABS: 40.0000 mg | ORAL_TABLET | Freq: Every day | ORAL | 3 refills | Status: DC

## 2018-02-18 NOTE — Telephone Encounter (Signed)
I assume they mean Dr. Okey Dupre. Please advise.

## 2018-02-18 NOTE — Telephone Encounter (Signed)
LVM informing patient that we had already talked about her labs and that she wanted to try fish oil. Informed patient to call back if this is not what she was contacting us about

## 2018-02-18 NOTE — Telephone Encounter (Signed)
Pt wants to know if Dr Okey Duprerawford is going to call her something in for cholesterol. She said she went to pharmacy to pick it up and they only had amlodipine for her and so she did not pick that up. Please advise.

## 2018-02-18 NOTE — Telephone Encounter (Signed)
Sent message to Dr. Okey Duprerawford through result notes

## 2018-04-07 DIAGNOSIS — R69 Illness, unspecified: Secondary | ICD-10-CM | POA: Diagnosis not present

## 2018-05-13 ENCOUNTER — Other Ambulatory Visit (INDEPENDENT_AMBULATORY_CARE_PROVIDER_SITE_OTHER): Payer: Medicare HMO

## 2018-05-13 ENCOUNTER — Ambulatory Visit: Payer: Medicare HMO | Admitting: Internal Medicine

## 2018-05-13 ENCOUNTER — Ambulatory Visit (INDEPENDENT_AMBULATORY_CARE_PROVIDER_SITE_OTHER): Payer: Medicare HMO | Admitting: Internal Medicine

## 2018-05-13 ENCOUNTER — Encounter: Payer: Self-pay | Admitting: Internal Medicine

## 2018-05-13 VITALS — BP 200/90 | HR 60 | Temp 97.9°F | Ht 64.0 in | Wt 182.0 lb

## 2018-05-13 DIAGNOSIS — E781 Pure hyperglyceridemia: Secondary | ICD-10-CM

## 2018-05-13 DIAGNOSIS — R002 Palpitations: Secondary | ICD-10-CM

## 2018-05-13 DIAGNOSIS — R42 Dizziness and giddiness: Secondary | ICD-10-CM | POA: Diagnosis not present

## 2018-05-13 DIAGNOSIS — I10 Essential (primary) hypertension: Secondary | ICD-10-CM | POA: Diagnosis not present

## 2018-05-13 LAB — COMPREHENSIVE METABOLIC PANEL
ALT: 20 U/L (ref 0–35)
AST: 14 U/L (ref 0–37)
Albumin: 4.4 g/dL (ref 3.5–5.2)
Alkaline Phosphatase: 62 U/L (ref 39–117)
BUN: 20 mg/dL (ref 6–23)
CO2: 26 mEq/L (ref 19–32)
Calcium: 9.9 mg/dL (ref 8.4–10.5)
Chloride: 103 mEq/L (ref 96–112)
Creatinine, Ser: 0.89 mg/dL (ref 0.40–1.20)
GFR: 60.94 mL/min (ref >60.00)
GLUCOSE: 119 mg/dL — AB (ref 70–99)
Potassium: 4.4 mEq/L (ref 3.5–5.1)
SODIUM: 139 meq/L (ref 135–145)
Total Bilirubin: 0.7 mg/dL (ref 0.2–1.2)
Total Protein: 7.5 g/dL (ref 6.0–8.3)

## 2018-05-13 LAB — LIPID PANEL
Cholesterol: 197 mg/dL (ref 0–200)
HDL: 43.4 mg/dL (ref >39.00)
NonHDL: 153.29
Total CHOL/HDL Ratio: 5
Triglycerides: 394 mg/dL — ABNORMAL HIGH (ref 0.0–149.0)
VLDL: 78.8 mg/dL — ABNORMAL HIGH (ref 0.0–40.0)

## 2018-05-13 LAB — LDL CHOLESTEROL, DIRECT: Direct LDL: 94 mg/dL

## 2018-05-13 NOTE — Progress Notes (Signed)
Subjective:   Patient ID: Kayla Craig, female    DOB: [birth year], 83 y.o.   MRN: [id number]  HPI The patient is an 83 YO female coming in for several concerns including blood pressure (has not taken meds this morning, no changes to medications recently, has had okay blood pressures at home except at dentist, does not usually take meds until lunchtime or when she remembers, denies bad headaches or chest pains), and fall (having some dizziness afterwards and mild headaches, mostly in the back of her head and neck muscles, denies impact to her head, denies LOC, did impact right shoulder and knee both of which are feeling better, about 2 weeks ago, fell trying to get a cart over some curb and it fell on her), and dizziness (she is having some problems with this at rest, denies it happening with exertion, denies passing out, denies it feels like room is spinning, she also recently stopped taking zyrtec, this started after that, she denies chest pains or numbness or weakness, denies vision or hearing changes, took a zyrtec last Friday after a bad episode which lasted several hours, has not had any since that time).   Review of Systems  Constitutional: Negative.   HENT: Negative.   Eyes: Negative.   Respiratory: Negative for cough, chest tightness and shortness of breath.   Cardiovascular: Negative for chest pain, palpitations and leg swelling.  Gastrointestinal: Negative for abdominal distention, abdominal pain, constipation, diarrhea, nausea and vomiting.  Musculoskeletal: Positive for myalgias.  Skin: Negative.   Neurological: Positive for dizziness and headaches.  Psychiatric/Behavioral: Negative.     Objective:  Physical Exam Constitutional:      Appearance: She is well-developed.  HENT:     Head: Normocephalic and atraumatic.     Right Ear: Tympanic membrane normal.     Left Ear: Tympanic membrane normal.     Nose: Nose normal.     Mouth/Throat:     Mouth: Mucous membranes are dry.    Eyes:     Extraocular Movements: Extraocular movements intact.     Conjunctiva/sclera: Conjunctivae normal.     Pupils: Pupils are equal, round, and reactive to light.  Neck:     Musculoskeletal: Normal range of motion.  Cardiovascular:     Rate and Rhythm: Normal rate and regular rhythm.  Pulmonary:     Effort: Pulmonary effort is normal. No respiratory distress.     Breath sounds: Normal breath sounds. No wheezing or rales.  Abdominal:     General: Bowel sounds are normal. There is no distension.     Palpations: Abdomen is soft.     Tenderness: There is no abdominal tenderness. There is no rebound.  Musculoskeletal:        General: Tenderness present.     Comments: Mild pain right shoulder with ROM, ROM intact  Skin:    General: Skin is warm and dry.  Neurological:     Mental Status: She is alert and oriented to person, place, and time.     Coordination: Coordination normal.    EKG: Rate 59, axis normal, 1st degree AV block, sinus brady, no st or t wave changes, mild LVH, when compared to 2014 it is stable as this had LVH and 1st AV block as well.   Vitals:   05/13/18 1008  BP: (!) 200/90  Pulse: 60  Temp: 97.9 F (36.6 C)  TempSrc: Oral  SpO2: 99%  Weight: 182 lb (82.6 kg)  Height: 5\' 4"  (1.626 m)  Assessment & Plan:

## 2018-05-13 NOTE — Patient Instructions (Addendum)
The EKG of the heart is not different since last time.   We are checking the labs today and will call you back about the results.

## 2018-05-14 DIAGNOSIS — R42 Dizziness and giddiness: Secondary | ICD-10-CM | POA: Insufficient documentation

## 2018-05-14 NOTE — Assessment & Plan Note (Signed)
Started taking lipitor 40 mg daily about 3-4 months ago and will recheck lipid panel.

## 2018-05-14 NOTE — Assessment & Plan Note (Signed)
Checking CMP and EKG done in the office which was without changes. Showed mild LVH in setting of known HTN. Suspect could be related to either fall or allergic rhinitis.

## 2018-05-14 NOTE — Assessment & Plan Note (Signed)
BP elevated today but home readings on meds indicate good control.

## 2018-05-16 ENCOUNTER — Telehealth: Payer: Self-pay | Admitting: Internal Medicine

## 2018-05-16 NOTE — Telephone Encounter (Signed)
Patient notified- see lab note 

## 2018-05-16 NOTE — Telephone Encounter (Signed)
Patient returning call for lab results, NT unavailable.   Copied from CRM [id number]. Topic: Quick Communication - Lab Results (Clinic Use ONLY) >> May 15, 2018 11:39 AM Josetta Huddle, CMA wrote: Called patient to inform them of recent lab results. When patient returns call, triage nurse may disclose results.

## 2018-05-19 ENCOUNTER — Other Ambulatory Visit: Payer: Self-pay | Admitting: Internal Medicine

## 2018-10-06 DIAGNOSIS — H524 Presbyopia: Secondary | ICD-10-CM | POA: Diagnosis not present

## 2018-10-06 DIAGNOSIS — H52223 Regular astigmatism, bilateral: Secondary | ICD-10-CM | POA: Diagnosis not present

## 2018-11-08 DIAGNOSIS — R69 Illness, unspecified: Secondary | ICD-10-CM | POA: Diagnosis not present

## 2018-12-12 DIAGNOSIS — R69 Illness, unspecified: Secondary | ICD-10-CM | POA: Diagnosis not present

## 2019-02-04 ENCOUNTER — Ambulatory Visit: Payer: Medicare HMO

## 2019-02-09 ENCOUNTER — Ambulatory Visit (INDEPENDENT_AMBULATORY_CARE_PROVIDER_SITE_OTHER): Payer: Medicare HMO | Admitting: Internal Medicine

## 2019-02-09 ENCOUNTER — Other Ambulatory Visit: Payer: Self-pay

## 2019-02-09 ENCOUNTER — Encounter: Payer: Self-pay | Admitting: Internal Medicine

## 2019-02-10 NOTE — Progress Notes (Signed)
Error

## 2019-02-17 ENCOUNTER — Other Ambulatory Visit (INDEPENDENT_AMBULATORY_CARE_PROVIDER_SITE_OTHER): Payer: Medicare HMO

## 2019-02-17 ENCOUNTER — Encounter: Payer: Self-pay | Admitting: Internal Medicine

## 2019-02-17 ENCOUNTER — Ambulatory Visit (INDEPENDENT_AMBULATORY_CARE_PROVIDER_SITE_OTHER): Payer: Medicare HMO | Admitting: Internal Medicine

## 2019-02-17 ENCOUNTER — Other Ambulatory Visit: Payer: Self-pay

## 2019-02-17 VITALS — BP 132/70 | HR 76 | Temp 99.1°F | Ht 64.0 in | Wt 186.0 lb

## 2019-02-17 DIAGNOSIS — I10 Essential (primary) hypertension: Secondary | ICD-10-CM

## 2019-02-17 DIAGNOSIS — K219 Gastro-esophageal reflux disease without esophagitis: Secondary | ICD-10-CM | POA: Diagnosis not present

## 2019-02-17 DIAGNOSIS — Z0000 Encounter for general adult medical examination without abnormal findings: Secondary | ICD-10-CM | POA: Diagnosis not present

## 2019-02-17 DIAGNOSIS — E559 Vitamin D deficiency, unspecified: Secondary | ICD-10-CM

## 2019-02-17 DIAGNOSIS — E039 Hypothyroidism, unspecified: Secondary | ICD-10-CM

## 2019-02-17 DIAGNOSIS — E781 Pure hyperglyceridemia: Secondary | ICD-10-CM

## 2019-02-17 DIAGNOSIS — E538 Deficiency of other specified B group vitamins: Secondary | ICD-10-CM

## 2019-02-17 DIAGNOSIS — M150 Primary generalized (osteo)arthritis: Secondary | ICD-10-CM

## 2019-02-17 DIAGNOSIS — R7301 Impaired fasting glucose: Secondary | ICD-10-CM

## 2019-02-17 DIAGNOSIS — M159 Polyosteoarthritis, unspecified: Secondary | ICD-10-CM | POA: Insufficient documentation

## 2019-02-17 DIAGNOSIS — M8949 Other hypertrophic osteoarthropathy, multiple sites: Secondary | ICD-10-CM | POA: Insufficient documentation

## 2019-02-17 LAB — COMPREHENSIVE METABOLIC PANEL
ALT: 25 U/L (ref 0–35)
AST: 21 U/L (ref 0–37)
Albumin: 4.3 g/dL (ref 3.5–5.2)
Alkaline Phosphatase: 64 U/L (ref 39–117)
BUN: 21 mg/dL (ref 6–23)
CO2: 24 mEq/L (ref 19–32)
Calcium: 9.8 mg/dL (ref 8.4–10.5)
Chloride: 100 mEq/L (ref 96–112)
Creatinine, Ser: 0.88 mg/dL (ref 0.40–1.20)
GFR: 61.62 mL/min (ref >60.00)
Glucose, Bld: 118 mg/dL — ABNORMAL HIGH (ref 70–99)
Potassium: 3.8 mEq/L (ref 3.5–5.1)
Sodium: 134 mEq/L — ABNORMAL LOW (ref 135–145)
Total Bilirubin: 0.5 mg/dL (ref 0.2–1.2)
Total Protein: 7.7 g/dL (ref 6.0–8.3)

## 2019-02-17 LAB — CBC
HCT: 38.3 % (ref 36.0–46.0)
Hemoglobin: 13 g/dL (ref 12.0–15.0)
MCHC: 33.9 g/dL (ref 30.0–36.0)
MCV: 85 fl (ref 78.0–100.0)
Platelets: 250 10*3/uL (ref 150.0–400.0)
RBC: 4.51 Mil/uL (ref 3.87–5.11)
RDW: 14.2 % (ref 11.5–15.5)
WBC: 7.5 10*3/uL (ref 4.0–10.5)

## 2019-02-17 LAB — LIPID PANEL
Cholesterol: 300 mg/dL — ABNORMAL HIGH (ref 0–200)
HDL: 39 mg/dL — ABNORMAL LOW (ref >39.00)
Total CHOL/HDL Ratio: 8
Triglycerides: 943 mg/dL — ABNORMAL HIGH (ref 0.0–149.0)

## 2019-02-17 LAB — TSH: TSH: 2.55 u[IU]/mL (ref 0.35–4.50)

## 2019-02-17 LAB — VITAMIN D 25 HYDROXY (VIT D DEFICIENCY, FRACTURES): VITD: 23.53 ng/mL — ABNORMAL LOW (ref 30.00–100.00)

## 2019-02-17 LAB — HEMOGLOBIN A1C: Hgb A1c MFr Bld: 8.1 % — ABNORMAL HIGH (ref 4.6–6.5)

## 2019-02-17 LAB — LDL CHOLESTEROL, DIRECT: Direct LDL: 87 mg/dL

## 2019-02-17 LAB — VITAMIN B12: Vitamin B-12: 481 pg/mL (ref 211–911)

## 2019-02-17 LAB — T4, FREE: Free T4: 0.81 ng/dL (ref 0.60–1.60)

## 2019-02-17 MED ORDER — ZOSTER VAC RECOMB ADJUVANTED 50 MCG/0.5ML IM SUSR: 0.5000 mL | Freq: Once | INTRAMUSCULAR | 1 refills | Status: AC

## 2019-02-17 NOTE — Assessment & Plan Note (Signed)
Flu shot up to date. Pneumonia complete. Shingrix given rx. Tetanus due 2024. Colonoscopy aged out. Mammogram aged out, pap smear aged out and dexa declines further. Counseled about sun safety and mole surveillance. Counseled about the dangers of distracted driving. Given 10 year screening recommendations.

## 2019-02-17 NOTE — Assessment & Plan Note (Signed)
Taking protonix daily with good control of symptoms.

## 2019-02-17 NOTE — Assessment & Plan Note (Signed)
Stopped taking atorvastatin on her own. Checking lipid panel and adjust as needed.

## 2019-02-17 NOTE — Assessment & Plan Note (Signed)
Taking atenolol, amlodipine, losartan. Checking CMP and adjust as needed.

## 2019-02-17 NOTE — Assessment & Plan Note (Signed)
Checking TSH and free T4, adjust synthroid as needed.

## 2019-02-17 NOTE — Assessment & Plan Note (Signed)
Referral to orthopedics for right knee, shoulder and wrist pain.

## 2019-02-17 NOTE — Progress Notes (Signed)
Subjective:   Patient ID: Kayla Craig, female    DOB: February 23, 1936, 83 y.o.   MRN: [id number]  HPI Here for medicare wellness and physical, no new complaints. Please see A/P for status and treatment of chronic medical problems.   Diet: heart healthy Physical activity: sedentary Depression/mood screen: negative Hearing: intact to whispered voice Visual acuity: grossly normal, performs annual eye exam  ADLs: capable Fall risk: none Home safety: good Cognitive evaluation: intact to orientation, naming, recall and repetition EOL planning: adv directives discussed    Office Visit from 02/17/2019 in [facility]  PHQ-2 Total Score  0        Office Visit from 01/25/2017 in [facility]  PHQ-9 Total Score  1      I have personally reviewed and have noted 1. The patient's medical and social history - reviewed today no changes 2. Their use of alcohol, tobacco or illicit drugs 3. Their current medications and supplements 4. The patient's functional ability including ADL's, fall risks, home safety risks and hearing or visual impairment. 5. Diet and physical activities 6. Evidence for depression or mood disorders 7. Care team reviewed and updated  Patient Care Team: [name], MD as PCP - General (Internal Medicine) Calvert Cantor, MD (Ophthalmology) Paralee Cancel, MD (Orthopedic Surgery) Past Medical History:  Diagnosis Date  . B12 deficiency   . Back pain    Chronic  . Goiter   . 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 Hypertension   . TMJ (dislocation of temporomandibular joint)    Right Side  . Vitamin D deficiency    Past Surgical History:  Procedure Laterality Date  . EYE SURGERY  2010   cataract surgery with implants  . KNEE SURGERY     Right Arthroscopy '05 Dr Alvan Dame  . THYROIDECTOMY     August '09 for goiter (Dr Janace Hoard)  . TUBAL LIGATION     Family History  Problem Relation Age of Onset   . Cancer Mother   . Heart disease Father        CAD/MI  . Heart disease Brother        CAD/MI  . Heart disease Brother        CAD/MI  . Cancer Brother   . Diabetes Neg Hx    Review of Systems  Constitutional: Negative.   HENT: Negative.   Eyes: Negative.   Respiratory: Negative for cough, chest tightness and shortness of breath.   Cardiovascular: Negative for chest pain, palpitations and leg swelling.  Gastrointestinal: Negative for abdominal distention, abdominal pain, constipation, diarrhea, nausea and vomiting.  Musculoskeletal: Positive for arthralgias.  Skin: Negative.   Neurological: Negative.   Psychiatric/Behavioral: Negative.     Objective:  Physical Exam Constitutional:      Appearance: She is well-developed. She is obese.  HENT:     Head: Normocephalic and atraumatic.  Neck:     Musculoskeletal: Normal range of motion.  Cardiovascular:     Rate and Rhythm: Normal rate and regular rhythm.  Pulmonary:     Effort: Pulmonary effort is normal. No respiratory distress.     Breath sounds: Normal breath sounds. No wheezing or rales.  Abdominal:     General: Bowel sounds are normal. There is no distension.     Palpations: Abdomen is soft.     Tenderness: There is no abdominal tenderness. There is no rebound.  Skin:    General: Skin  is warm and dry.  Neurological:     Mental Status: She is alert and oriented to person, place, and time.     Coordination: Coordination normal.     Vitals:   02/17/19 1257  BP: 132/70  Pulse: 76  Temp: 99.1 F (37.3 C)  TempSrc: Oral  SpO2: 94%  Weight: 186 lb (84.4 kg)  Height: 5\' 4"  (1.626 m)    Assessment & Plan:

## 2019-02-17 NOTE — Patient Instructions (Addendum)
We have given you the prescription for the shingles to get 1 shot and then a second one about 2-4 months later.   We will get you in with the orthopedic doctor for the knee and hand and shoulder.   Health Maintenance, Female Adopting a healthy lifestyle and getting preventive care are important in promoting health and wellness. Ask your health care provider about:  The right schedule for you to have regular tests and exams.  Things you can do on your own to prevent diseases and keep yourself healthy. What should I know about diet, weight, and exercise? Eat a healthy diet   Eat a diet that includes plenty of vegetables, fruits, low-fat dairy products, and lean protein.  Do not eat a lot of foods that are high in solid fats, added sugars, or sodium. Maintain a healthy weight Body mass index (BMI) is used to identify weight problems. It estimates body fat based on height and weight. Your health care provider can help determine your BMI and help you achieve or maintain a healthy weight. Get regular exercise Get regular exercise. This is one of the most important things you can do for your health. Most adults should:  Exercise for at least 150 minutes each week. The exercise should increase your heart rate and make you sweat (moderate-intensity exercise).  Do strengthening exercises at least twice a week. This is in addition to the moderate-intensity exercise.  Spend less time sitting. Even light physical activity can be beneficial. Watch cholesterol and blood lipids Have your blood tested for lipids and cholesterol at 81 years of age, then have this test every 5 years. Have your cholesterol levels checked more often if:  Your lipid or cholesterol levels are high.  You are older than 81 years of age.  You are at high risk for heart disease. What should I know about cancer screening? Depending on your health history and family history, you may need to have cancer screening at various  ages. This may include screening for:  Breast cancer.  Cervical cancer.  Colorectal cancer.  Skin cancer.  Lung cancer. What should I know about heart disease, diabetes, and high blood pressure? Blood pressure and heart disease  High blood pressure causes heart disease and increases the risk of stroke. This is more likely to develop in people who have high blood pressure readings, are of African descent, or are overweight.  Have your blood pressure checked: ? Every 3-5 years if you are 67-2 years of age. ? Every year if you are 24 years old or older. Diabetes Have regular diabetes screenings. This checks your fasting blood sugar level. Have the screening done:  Once every three years after age 74 if you are at a normal weight and have a low risk for diabetes.  More often and at a younger age if you are overweight or have a high risk for diabetes. What should I know about preventing infection? Hepatitis B If you have a higher risk for hepatitis B, you should be screened for this virus. Talk with your health care provider to find out if you are at risk for hepatitis B infection. Hepatitis C Testing is recommended for:  Everyone born from [birth year] through 1965.  Anyone with known risk factors for hepatitis C. Sexually transmitted infections (STIs)  Get screened for STIs, including gonorrhea and chlamydia, if: ? You are sexually active and are younger than 81 years of age. ? You are older than 81 years of age and your  health care provider tells you that you are at risk for this type of infection. ? Your sexual activity has changed since you were last screened, and you are at increased risk for chlamydia or gonorrhea. Ask your health care provider if you are at risk.  Ask your health care provider about whether you are at high risk for HIV. Your health care provider may recommend a prescription medicine to help prevent HIV infection. If you choose to take medicine to prevent HIV, you  should first get tested for HIV. You should then be tested every 3 months for as long as you are taking the medicine. Pregnancy  If you are about to stop having your period (premenopausal) and you may become pregnant, seek counseling before you get pregnant.  Take 400 to 800 micrograms (mcg) of folic acid every day if you become pregnant.  Ask for birth control (contraception) if you want to prevent pregnancy. Osteoporosis and menopause Osteoporosis is a disease in which the bones lose minerals and strength with aging. This can result in bone fractures. If you are 84 years old or older, or if you are at risk for osteoporosis and fractures, ask your health care provider if you should:  Be screened for bone loss.  Take a calcium or vitamin D supplement to lower your risk of fractures.  Be given hormone replacement therapy (HRT) to treat symptoms of menopause. Follow these instructions at home: Lifestyle  Do not use any products that contain nicotine or tobacco, such as cigarettes, e-cigarettes, and chewing tobacco. If you need help quitting, ask your health care provider.  Do not use street drugs.  Do not share needles.  Ask your health care provider for help if you need support or information about quitting drugs. Alcohol use  Do not drink alcohol if: ? Your health care provider tells you not to drink. ? You are pregnant, may be pregnant, or are planning to become pregnant.  If you drink alcohol: ? Limit how much you use to 0-1 drink a day. ? Limit intake if you are breastfeeding.  Be aware of how much alcohol is in your drink. In the [place] one drink equals one 12 oz bottle of beer (355 mL), one 5 oz glass of wine (148 mL), or one 1 oz glass of hard liquor (44 mL). General instructions  Schedule regular health, dental, and eye exams.  Stay current with your vaccines.  Tell your health care provider if: ? You often feel depressed. ? You have ever been abused or do not feel  safe at home. Summary  Adopting a healthy lifestyle and getting preventive care are important in promoting health and wellness.  Follow your health care provider's instructions about healthy diet, exercising, and getting tested or screened for diseases.  Follow your health care provider's instructions on monitoring your cholesterol and blood pressure. This information is not intended to replace advice given to you by your health care provider. Make sure you discuss any questions you have with your health care provider. Document Released: 10/09/2010 Document Revised: 03/19/2018 Document Reviewed: 03/19/2018 Elsevier Patient Education  2020 [employer].

## 2019-02-17 NOTE — Assessment & Plan Note (Signed)
Checking B12 and adjust as needed. 

## 2019-02-18 ENCOUNTER — Other Ambulatory Visit: Payer: Self-pay | Admitting: Internal Medicine

## 2019-02-18 ENCOUNTER — Telehealth: Payer: Self-pay | Admitting: *Deleted

## 2019-02-18 MED ORDER — METFORMIN HCL 500 MG PO TABS: 500.0000 mg | ORAL_TABLET | Freq: Every day | ORAL | 3 refills | Status: DC

## 2019-02-18 MED ORDER — PRAVASTATIN SODIUM 20 MG PO TABS: 20.0000 mg | ORAL_TABLET | Freq: Every day | ORAL | 3 refills | Status: DC

## 2019-02-18 NOTE — Telephone Encounter (Signed)
Pt called in and was given the message

## 2019-02-18 NOTE — Telephone Encounter (Signed)
Pt called in and was given the message from Dr. Sharlet Salina dated 02/18/2019 at 8:01 AM regarding her lab results.  Kayla Craig is willing to take another medication for her cholesterol.   Kayla Craig stopped taking the Lipitor because it made her legs hurt.   Kayla Craig is willing to try again with a different generic medication.  Kayla Craig is also willing to take the generic of Metformin.    Kayla Craig would like a copy of these results mailed to her.    Kayla Craig does not have a computer.

## 2019-02-26 ENCOUNTER — Other Ambulatory Visit: Payer: Self-pay | Admitting: Internal Medicine

## 2019-03-13 ENCOUNTER — Encounter: Payer: Self-pay | Admitting: Internal Medicine

## 2019-04-07 ENCOUNTER — Telehealth: Payer: Self-pay | Admitting: Internal Medicine

## 2019-04-07 ENCOUNTER — Other Ambulatory Visit: Payer: Self-pay | Admitting: Internal Medicine

## 2019-04-07 NOTE — Telephone Encounter (Signed)
Dri from [facility] called to have a sign off on pt recent medication   levothyroxine (SYNTHROID) 50 MCG tablet [[id number]]    Please call back   Call back number [phone]

## 2019-04-07 NOTE — Telephone Encounter (Signed)
Ok given to change pharmacy brand. No longer able to order current brand.

## 2019-05-27 ENCOUNTER — Telehealth: Payer: Self-pay | Admitting: Internal Medicine

## 2019-05-27 NOTE — Telephone Encounter (Signed)
° °  Courtney from [employer] calling to confirm diagnosis of CHF or diabetes. Please call Toni Amend [phone]

## 2019-05-29 NOTE — Telephone Encounter (Signed)
F/u  The patient calling back to speak with Dr. Okey Dupre she will not have any insurance come Wednesday if Dr. Okey Dupre does not respond to [employer].

## 2019-05-29 NOTE — Telephone Encounter (Signed)
Diagnosis given to [facility].9

## 2019-06-30 ENCOUNTER — Ambulatory Visit: Payer: Medicare HMO | Admitting: *Deleted

## 2019-07-05 ENCOUNTER — Other Ambulatory Visit: Payer: Self-pay | Admitting: Internal Medicine

## 2019-07-24 ENCOUNTER — Other Ambulatory Visit: Payer: Self-pay | Admitting: *Deleted

## 2019-07-24 NOTE — Patient Outreach (Signed)
[facility]) Care Management Chronic Special Needs Program    07/24/2019  Name: Kayla Craig, DOB: 05-12-1935  MRN: [id number]   [name] is enrolled in a chronic special needs plan for Diabetes.  Health risk assessment previously completed by client.  Individualized care plan created from information in electronic medical record.  Client also has history of HTN.  RN care manager will send introductory letter with individualized care plan to primary care provider and client, along with education materials.  Assigned RN care manager will follow up within 1-2 months.  Goals    .  Acknowledge receipt of Art gallery manager mailed advanced directives packet RN care manager mailed EMMI education article " advanced directives"    . Client understands the importance of follow-up with providers by attending scheduled visits     Review of medical record indicates client has completed 2 office visits with primary care provider in 2020 Call and schedule a yearly physical and follow up visit as recommended by your health care provider.     . Client will verbalize knowledge of self management of Hypertension as evidences by BP reading of 140/90 or less; or as defined by provider     Take blood pressure medications as prescribed. Plan to check blood pressure regularly and take results to your health care provider appointments. Plan to follow a low salt diet. Increase activity as tolerated. Follow up with your health care provider as recommended. Please ask your health care provider " what is my target blood pressure range". Blood pressure on 02/17/19  132/70 RN care manager mailed EMMI education article "Diabetes and Blood pressure"    . HEMOGLOBIN A1C < 7.0     Per medical record review Hgb AIC completed on 02/17/19  8.1 Diabetes self management actions as follows- Continue to keep your follow up appointments with your provider and have lab  work completed as recommended. Monitor glucose (blood sugar) per health care provider recommendation. Check feet daily Visit health care provider every 3-6 months as directed. It is important to have your Hgb AIC checked every 3-6 months (every 6 months if you are at goal and every 3 months if you are not at goal). Eye exam yearly Carbohydrate controlled meal planning Take diabetes medications as prescribed by health care provider Physical activity RN care manager mailed EMMI education article " Diabetes diet"     . Maintain timely refills of diabetic medication as prescribed within the year .     Take medications as prescribed. Follow up with your health care provider if you have any questions. Please contact your assigned RN care manager if you have difficulty obtaining medications.      . Obtain annual  Lipid Profile, LDL-C     Per medical record review, Lipid profile completed on 02/17/19 LDL= 87 The goal for LDL is less than 70mg /dl as you are at high risk for complications. Try to avoid saturated fats, trans-fats and eat more fiber. Continue to follow up with your health care provider for any needed lab work.     . Obtain Annual Eye (retinal)  Exam      Per medical record review, client had eye exam on 10/06/18 Plan to have dilated eye exam every year Plan to schedule your eye exam yearly     . Obtain Annual Foot Exam     Please schedule annual foot exam with your health care provider    .  Obtain annual screen for micro albuminuria (urine) , nephropathy (kidney problems)     Per medical record review, unable to determine when microalbuminuria completed Continue to obtain yearly physicals and lab checks as recommended by your health care provider. It is important for your health care provider to check your urine for protein at least once every year.     Kayla Craig Hemoglobin A1C at least 2 times per year     [facility] checked 02/17/19 Continue to follow up with health care  provider for any recommended lab work    . Patient Stated     Stay as healthy and as independent as possible.     . Stay as healthy and active as possible     Increase physical activity and monitor diet closer.    . Visit Primary Care Provider or Endocrinologist at least 2 times per year      Client saw primary care provider for 2 visits in 2020       [name] [facility], BSN [facility] Professional [facility] Care Coordinator [phone]

## 2019-07-29 ENCOUNTER — Other Ambulatory Visit: Payer: Self-pay | Admitting: Internal Medicine

## 2019-07-31 ENCOUNTER — Other Ambulatory Visit: Payer: Self-pay | Admitting: *Deleted

## 2019-07-31 NOTE — Patient Outreach (Signed)
      [facility]) Care Management Chronic Special Needs Program    07/31/2019  Name: Kayla Craig, DOB: 10-04-1935  MRN: [id number]   Kayla Craig is enrolled in a chronic special needs plan for Diabetes.  RN care manager placed outreach call for initial telephonic assessment, no answer to [phone], left voicemail requesting return phone call, [phone] is wrong number per person that answered the phone.   PLAN Outreach client in 1-2 weeks  Kayla Craig [facility], BSN [facility] Care Coordinator [phone]

## 2019-08-04 ENCOUNTER — Other Ambulatory Visit: Payer: Self-pay | Admitting: *Deleted

## 2019-08-04 NOTE — Patient Outreach (Signed)
  [facility]) Care Management Chronic Special Needs Program    08/04/2019  Name: Kayla Craig, DOB: [birth year]  MRN: [id number]   [name] is enrolled in a chronic special needs plan for Diabetes.  Outreach call to client for initial telephone assessment/ 2nd attempt, no answer to telephone, left voicemail requesting return phone call.  [phone] is a wrong number.    PLAN Outreach client in 2-3 weeks  [name] [facility], BSN [facility] Care Coordinator [phone]

## 2019-08-06 ENCOUNTER — Ambulatory Visit: Payer: HMO | Admitting: *Deleted

## 2019-08-06 ENCOUNTER — Other Ambulatory Visit: Payer: Self-pay | Admitting: *Deleted

## 2019-08-06 NOTE — Patient Outreach (Addendum)
  [facility]) Care Management Chronic Special Needs Program    08/06/2019  Name: Kayla Craig, DOB: [birth year]  MRN: [id number]   [name] is enrolled in a chronic special needs plan for Diabetes.  Outreach call to client for initial telephone assessment/ 3rd attempt, no answer to telephone, left voicemail requesting return phone call.  RN care manager mailed unsuccessful outreach letter to client's home.  PLAN Outreach client in 6-9 months  [name] [facility], BSN [facility] Care Coordinator [phone]

## 2019-08-25 ENCOUNTER — Telehealth: Payer: Self-pay | Admitting: Internal Medicine

## 2019-08-25 NOTE — Progress Notes (Signed)
  Chronic Care Management   Outreach Note  08/25/2019 Name: Kayla Craig MRN: [id number] DOB: 1936/03/28  Referred by: Myrlene Broker, MD Reason for referral : No chief complaint on file.   An unsuccessful telephone outreach was attempted today. The patient was referred to the pharmacist for assistance with care management and care coordination.   This note is not being shared with the patient for the following reason: To respect privacy (The patient or proxy has requested that the information not be shared).  Follow Up Plan:   Lynnae January Upstream Scheduler

## 2019-09-18 ENCOUNTER — Telehealth: Payer: Self-pay | Admitting: Internal Medicine

## 2019-09-18 NOTE — Progress Notes (Signed)
  Chronic Care Management   Outreach Note  09/18/2019 Name: Kayla Craig MRN: [id number] DOB: [birth year]  Referred by: Myrlene Broker, MD Reason for referral : No chief complaint on file.   An unsuccessful telephone outreach was attempted today. The patient was referred to the pharmacist for assistance with care management and care coordination. This note is not being shared with the patient for the following reason: To respect privacy (The patient or proxy has requested that the information not be shared).  Follow Up Plan:   Lynnae January Upstream Scheduler

## 2019-10-07 ENCOUNTER — Telehealth: Payer: Self-pay | Admitting: Internal Medicine

## 2019-10-07 NOTE — Progress Notes (Signed)
°  Chronic Care Management   Outreach Note  10/07/2019 Name: Kayla Craig MRN: [id number] DOB: 1935-08-22  Referred by: Myrlene Broker, MD Reason for referral : No chief complaint on file.   An unsuccessful telephone outreach was attempted today. The patient was referred to the pharmacist for assistance with care management and care coordination. This note is not being shared with the patient for the following reason: To respect privacy (The patient or proxy has requested that the information not be shared).  Follow Up Plan:   Lynnae January Upstream Scheduler

## 2019-10-19 DIAGNOSIS — H04123 Dry eye syndrome of bilateral lacrimal glands: Secondary | ICD-10-CM | POA: Diagnosis not present

## 2019-10-19 DIAGNOSIS — H35033 Hypertensive retinopathy, bilateral: Secondary | ICD-10-CM | POA: Diagnosis not present

## 2019-10-19 DIAGNOSIS — H35373 Puckering of macula, bilateral: Secondary | ICD-10-CM | POA: Diagnosis not present

## 2019-10-19 DIAGNOSIS — Z961 Presence of intraocular lens: Secondary | ICD-10-CM | POA: Diagnosis not present

## 2019-10-19 DIAGNOSIS — H0100A Unspecified blepharitis right eye, upper and lower eyelids: Secondary | ICD-10-CM | POA: Diagnosis not present

## 2019-10-21 ENCOUNTER — Other Ambulatory Visit: Payer: Self-pay

## 2019-10-21 ENCOUNTER — Ambulatory Visit (INDEPENDENT_AMBULATORY_CARE_PROVIDER_SITE_OTHER): Payer: HMO | Admitting: Podiatry

## 2019-10-21 ENCOUNTER — Ambulatory Visit (INDEPENDENT_AMBULATORY_CARE_PROVIDER_SITE_OTHER): Payer: HMO

## 2019-10-21 ENCOUNTER — Encounter: Payer: Self-pay | Admitting: Podiatry

## 2019-10-21 DIAGNOSIS — M2041 Other hammer toe(s) (acquired), right foot: Secondary | ICD-10-CM

## 2019-10-21 DIAGNOSIS — I999 Unspecified disorder of circulatory system: Secondary | ICD-10-CM | POA: Diagnosis not present

## 2019-10-21 DIAGNOSIS — M216X9 Other acquired deformities of unspecified foot: Secondary | ICD-10-CM

## 2019-10-22 NOTE — Progress Notes (Signed)
Subjective:   Patient ID: Kayla Craig, female   DOB: 84 y.o.   MRN: [id number]   HPI Patient presents stating that the second digit right foot has been painful and that she has trouble with any pressure on shoe and states that this is been getting worse and it really moved over the last couple years.  Knows that she has structural bunion deformity also that may be contributory and does not smoke likes to be active   Review of Systems  All other systems reviewed and are negative.       Objective:  Physical Exam Vitals and nursing note reviewed.  Constitutional:      Appearance: She is well-developed.  Pulmonary:     Effort: Pulmonary effort is normal.  Musculoskeletal:        General: Normal range of motion.  Skin:    General: Skin is warm.  Neurological:     Mental Status: She is alert.     Neurovascular status intact muscle strength was found to be adequate range of motion within normal limits.  Patient does have diminishment of pulses that are present but weak and is found to have a significant rigid contracture digit to right that is very painful along with exquisite discomfort of the second metatarsal phalangeal joint right that is painful.  Patient does have good digital perfusion well oriented x3     Assessment:  Inflammatory capsulitis of the second MPJ right along with severe rigid hammertoe deformity with probable flexor plate dislocation of the joint     Plan:  H&P reviewed condition and x-rays.  At this point I went ahead and I discussed the consideration for digital fusion shortening osteotomy but I first want to get vascular studies to confirm everything is okay and then I went ahea and I educated her on the procedures that will be necessary.  She is willing to accept this but first I want to make sure her circulatory status is good.  Dispensed pad I want to see how that does for her short-term  X-rays indicate that the toe is in significant elevation of a rigid  nature with extreme pressure against the metatarsal phalangeal joint

## 2019-10-27 ENCOUNTER — Telehealth: Payer: Self-pay | Admitting: *Deleted

## 2019-10-27 DIAGNOSIS — I999 Unspecified disorder of circulatory system: Secondary | ICD-10-CM

## 2019-10-27 DIAGNOSIS — M2041 Other hammer toe(s) (acquired), right foot: Secondary | ICD-10-CM

## 2019-10-27 DIAGNOSIS — Z01818 Encounter for other preprocedural examination: Secondary | ICD-10-CM

## 2019-10-27 NOTE — Telephone Encounter (Signed)
Faxed orders to [facility]. 

## 2019-11-26 ENCOUNTER — Ambulatory Visit (HOSPITAL_COMMUNITY)
Admission: RE | Admit: 2019-11-26 | Discharge: 2019-11-26 | Disposition: A | Discharge status: Home / Self Care | Payer: HMO | Source: Ambulatory Visit | Attending: Cardiology | Admitting: Cardiology

## 2019-11-26 ENCOUNTER — Other Ambulatory Visit: Payer: Self-pay

## 2019-11-26 DIAGNOSIS — Z01818 Encounter for other preprocedural examination: Secondary | ICD-10-CM

## 2019-11-26 DIAGNOSIS — M2041 Other hammer toe(s) (acquired), right foot: Secondary | ICD-10-CM | POA: Diagnosis not present

## 2019-11-26 DIAGNOSIS — I999 Unspecified disorder of circulatory system: Secondary | ICD-10-CM | POA: Insufficient documentation

## 2019-11-26 DIAGNOSIS — M79674 Pain in right toe(s): Secondary | ICD-10-CM | POA: Diagnosis not present

## 2019-12-15 ENCOUNTER — Encounter: Payer: Self-pay | Admitting: Cardiovascular Disease

## 2019-12-15 ENCOUNTER — Ambulatory Visit (INDEPENDENT_AMBULATORY_CARE_PROVIDER_SITE_OTHER): Payer: HMO | Admitting: Cardiovascular Disease

## 2019-12-15 ENCOUNTER — Other Ambulatory Visit: Payer: Self-pay

## 2019-12-15 VITALS — BP 160/80 | HR 57 | Resp 98 | Ht 64.0 in | Wt 172.2 lb

## 2019-12-15 DIAGNOSIS — I10 Essential (primary) hypertension: Secondary | ICD-10-CM | POA: Diagnosis not present

## 2019-12-15 DIAGNOSIS — E782 Mixed hyperlipidemia: Secondary | ICD-10-CM | POA: Diagnosis not present

## 2019-12-15 DIAGNOSIS — I739 Peripheral vascular disease, unspecified: Secondary | ICD-10-CM

## 2019-12-15 NOTE — Patient Instructions (Signed)
Medication Instructions:  The current medical regimen is effective;  continue present plan and medications.  *If you need a refill on your cardiac medications before your next appointment, please call your pharmacy*  Follow-Up: At [facility], you and your health needs are our priority.  As part of our continuing mission to provide you with exceptional heart care, we have created designated Provider Care Teams.  These Care Teams include your primary Cardiologist (physician) and Advanced Practice Providers (APPs -  Physician Assistants and Nurse Practitioners) who all work together to provide you with the care you need, when you need it.  We recommend signing up for the patient portal called "MyChart".  Sign up information is provided on this After Visit Summary.  MyChart is used to connect with patients for Virtual Visits (Telemedicine).  Patients are able to view lab/test results, encounter notes, upcoming appointments, etc.  Non-urgent messages can be sent to your provider as well.   To learn more about what you can do with MyChart, go to [email or web address].    Your next appointment:   3 month(s)  The format for your next appointment:   In Person  Provider:   Lorine Bears, MD

## 2019-12-15 NOTE — Progress Notes (Signed)
Cardiology Office Note   Date:  12/15/2019   ID:  Kayla Craig, DOB 1936-01-25, MRN [id number]  PCP:  Myrlene Broker, MD  Cardiologist:   Lorine Bears, MD   No chief complaint on file.     History of Present Illness: Kayla Craig is a 84 y.o. female who was referred by Dr. Charlsie Merles for evaluation and management of peripheral arterial disease.  She has no prior cardiac history.  She has multiple chronic medical conditions including essential hypertension, type 2 diabetes, hyperlipidemia, GERD and osteoarthritis.  She suffers from hammertoes on both sides but with significant discomfort affecting the right second toe with some discoloration.  She was initially planning to have surgery done and was referred for vascular evaluation before that. She underwent noninvasive vascular evaluation which showed an ABI of 0.78 on the right and 0.65 on the left. Duplex showed severe stenosis in the right popliteal artery with occluded posterior tibial artery.  On the left, there was also severe stenosis in the popliteal artery extending into the TP trunk with occluded posterior tibial artery. She denies calf claudication and she is only bothered by her toe discomfort especially at night.  She has no lower extremity ulceration. No chest pain or shortness of breath.  She is not a smoker.  Past Medical History:  Diagnosis Date  . B12 deficiency   . Back pain    Chronic  . Goiter   . 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 Hypertension   . TMJ (dislocation of temporomandibular joint)    Right Side  . Vitamin D deficiency     Past Surgical History:  Procedure Laterality Date  . EYE SURGERY  2010   cataract surgery with implants  . KNEE SURGERY     Right Arthroscopy '05 Dr Charlann Boxer  . THYROIDECTOMY     August '09 for goiter (Dr Jearld Fenton)  . TUBAL LIGATION       Current Outpatient Medications  Medication Sig Dispense Refill  . amLODipine (NORVASC) 10 MG tablet Take 1  tablet by mouth once daily 90 tablet 2  . aspirin 81 MG EC tablet Take 81 mg by mouth daily.      Marland Kitchen atenolol (TENORMIN) 50 MG tablet Take 1 tablet by mouth once daily 90 tablet 2  . Calcium Carbonate-Vit D-Min (CALCIUM 1200 PO) Take by mouth.    . Cholecalciferol (VITAMIN D-3) 1000 UNITS CAPS Take 1 capsule by mouth daily.    Marland Kitchen levothyroxine (SYNTHROID) 50 MCG tablet TAKE 1 TABLET BY MOUTH ONCE DAILY BEFORE BREAKFAST 90 tablet 3  . losartan (COZAAR) 100 MG tablet Take 1 tablet by mouth once daily 90 tablet 1  . metFORMIN (GLUCOPHAGE) 500 MG tablet Take 1 tablet (500 mg total) by mouth daily with breakfast. 90 tablet 3  . pantoprazole (PROTONIX) 40 MG tablet Take 1 tablet by mouth once daily 90 tablet 3  . pravastatin (PRAVACHOL) 20 MG tablet Take 1 tablet (20 mg total) by mouth daily. 90 tablet 3  . vitamin B-12 (CYANOCOBALAMIN) 1000 MCG tablet Take 1,000 mcg by mouth daily.       No current facility-administered medications for this visit.    Allergies:   Codeine    Social History:  The patient  reports that she has never smoked. She has never used smokeless tobacco. She reports that she does not drink alcohol and does not use drugs.   Family History:  The patient's family history  includes Cancer in her brother and mother; Heart disease in her brother, brother, and father.    ROS:  Please see the history of present illness.   Otherwise, review of systems are positive for none.   All other systems are reviewed and negative.    PHYSICAL EXAM: VS:  BP (!) 160/80   Pulse (!) 57   Resp (!) 98   Ht 5\' 4"  (1.626 m)   Wt 172 lb 3.2 oz (78.1 kg)   SpO2 98%   BMI 29.56 kg/m  , BMI Body mass index is 29.56 kg/m. GEN: Well nourished, well developed, in no acute distress  HEENT: normal  Neck: no JVD, carotid bruits, or masses Cardiac: RRR; no murmurs, rubs, or gallops,no edema  Respiratory:  clear to auscultation bilaterally, normal work of breathing GI: soft, nontender, nondistended, +  BS MS: no deformity or atrophy  Skin: warm and dry, no rash Neuro:  Strength and sensation are intact Psych: euthymic mood, full affect Vascular: Radial pulses normal bilaterally.  Posterior tibial is not palpable.  Dorsalis pedis is +1 bilaterally.   EKG:  EKG is ordered today. The ekg ordered today demonstrates sinus bradycardia with first-degree AV block and borderline criteria for LVH.   Recent Labs: 02/17/2019: ALT 25; BUN 21; Creatinine, Ser 0.88; Hemoglobin 13.0; Platelets 250.0; Potassium 3.8; Sodium 134; TSH 2.55    Lipid Panel    Component Value Date/Time   CHOL 300 (H) 02/17/2019 1325   TRIG (H) 02/17/2019 1325    943.0 Triglyceride is over 400; calculations on Lipids are invalid.   HDL 39.00 (L) 02/17/2019 1325   CHOLHDL 8 02/17/2019 1325   VLDL 78.8 (H) 05/13/2018 1039   LDLDIRECT 87.0 02/17/2019 1325      Wt Readings from Last 3 Encounters:  12/15/19 172 lb 3.2 oz (78.1 kg)  02/17/19 186 lb (84.4 kg)  05/13/18 182 lb (82.6 kg)      No flowsheet data found.    ASSESSMENT AND PLAN:  1.  Peripheral arterial disease: The patient has evidence of severe popliteal artery stenosis bilaterally slightly worse on the left side given the extension into the TP trunk.  Currently she has no open ulceration and she is mostly bothered by hammertoes.  If she were to have any foot surgery, I definitely recommend lower extremity revascularization before  surgery.  However, the patient informs me that she does not want to have any foot surgery or any other invasive procedure unless her symptoms worsen.  She is worried because one of her friends who was diabetic ended up with gangrenous foot after surgery.  I asked her to monitor her symptoms closely and if there is any worsening to let 07/12/18 know.  Otherwise I will see her back in 3 months.  2.  Essential hypertension: Blood pressure is elevated today and this will need to be monitored closely.  3.  Hyperlipidemia: Triglyceride  was severely elevated greater than 900.  The patient might need to be treated with a fibrate.  She is currently on pravastatin 20 mg daily.     Disposition:   FU with me in 3 months  Signed,  [place], MD  12/15/2019 5:31 PM    [facility]

## 2020-02-15 ENCOUNTER — Other Ambulatory Visit: Payer: Self-pay | Admitting: Internal Medicine

## 2020-02-17 ENCOUNTER — Other Ambulatory Visit: Payer: Self-pay

## 2020-02-17 ENCOUNTER — Telehealth: Payer: Self-pay | Admitting: Internal Medicine

## 2020-02-17 MED ORDER — LOSARTAN POTASSIUM 100 MG PO TABS: 100.0000 mg | ORAL_TABLET | Freq: Every day | ORAL | 1 refills | Status: DC

## 2020-02-17 MED ORDER — PANTOPRAZOLE SODIUM 40 MG PO TBEC: 40.0000 mg | DELAYED_RELEASE_TABLET | Freq: Every day | ORAL | 3 refills | Status: DC

## 2020-02-17 NOTE — Telephone Encounter (Signed)
pantoprazole (PROTONIX) 40 MG tablet losartan (COZAAR) 100 MG tablet [facility] [address] [place], [place] - [address] Phone:  [phone]  Fax:  [phone]     Requesting a refill

## 2020-02-18 ENCOUNTER — Encounter: Payer: Self-pay | Admitting: *Deleted

## 2020-02-18 ENCOUNTER — Other Ambulatory Visit: Payer: Self-pay

## 2020-02-18 ENCOUNTER — Other Ambulatory Visit: Payer: Self-pay | Admitting: *Deleted

## 2020-02-18 MED ORDER — LOSARTAN POTASSIUM 100 MG PO TABS: 100.0000 mg | ORAL_TABLET | Freq: Every day | ORAL | 0 refills | Status: DC

## 2020-02-18 NOTE — Patient Outreach (Signed)
[facility]) Care Management Chronic Special Needs Program  02/18/2020  Name: ROSANNE WOHLFARTH DOB: 1935-05-08  MRN: [id number]  Ms. Blakleigh Straw is enrolled in a chronic special needs plan for Diabetes. Chronic Care Management Coordinator telephoned client to review health risk assessment and to develop individualized care plan.  Introduced the chronic care management program, importance of client participation, and taking their care plan to all provider appointments and inpatient facilities.  Reviewed the transition of care process and possible referral to community care management.  Subjective: Client reports she lives alone in apartment, is independent with all aspects of her care, continues to drive, has sister in law she can depend on if needed, adult son lives in another state.  Client reports she is to follow up with primary care provider on 02/26/20, client does not have glucometer and does not check CBG, states she will discuss further with primary care provider at her next appointment when Hgb AIC is checked, if there are any changes to be made.  Client states she has living will and did not complete HCPOA but would like advance directives packet to be mailed out again as she may decide to complete.  RN care manager completed health risk assessment with client.   Goals Addressed            This Visit's Progress   .  Acknowledge receipt of Adult nurse mailed client Advanced Directives packet. RN care manager reviewed importance of having Advanced Directives forms completed. Plan to have Advanced Directives (Living Will and POA) forms notarized and witnessed. RN care manager mailed EMMI education article " Advanced Directives"     . Client will verbalize knowledge of self management of Hypertension as evidences by BP reading of 140/90 or less; or as defined by provider       Plan to check blood pressure regularly.  If you do not  have a B/P monitor (cuff), one can be provided to you.  Write results in your [employer] calendar (in the back section). Reviewed blood pressure medication from EMR. Take B/P medications as ordered.  Some may cause you to use the bathroom more. Plan to eat low salt and heart healthy meals full of fruits, vegetables, whole grains, lean protein and limit fat and sugars. Increase activity as tolerated. Reviewed lifestyle modification- smoking cessation, weight control and reducing stress. EMMI education article provided "High Blood Pressure in Adults"  Review and plan to discuss with RN during next telephonic assessment.     Marland Kitchen HEMOGLOBIN A1C < 7       Your last documented AIC is 8.1 on 02/17/19.  Have your [facility] checked every 6 months if you are at goal or every 3 months if you are not at goal. Check blood sugars daily before eating with goal of 80-130.  You can also check 1 1/2 hours after eating with goal of 180 or less. Plan to eat low carbohydrate and low salt meals, watch portion sizes and avoid sugar sweetened drinks.  Discussed carbohydrate control meals. Reviewed signs and symptoms of hyperglycemia (high blood sugar) and hypoglycemia (low blood sugar) and actions to take. Review [employer] calendar (sent in the mail) for diabetes action plan in the back. Reviewed nutrition counseling benefit provided by [employer]. Increase activity only if you are able to do it.  Follow doctor recommendations. EMMI education provided on "Diabetes and Diet"  .  Review and plan to discuss with RN during next telephonic assessment.  Use 24 hour nurse line as needed at [phone].       . Maintain timely refills of diabetic medication as prescribed within the year .   On track    Contact your RN care manager if you have questions about medicines. Medication review completed from EMR information. It is important to take your medications as prescribed. Reviewed use and  possible side effects of diabetes medications.        . Obtain annual  Lipid Profile, LDL-C       Per medical record review, Lipid profile completed on 02/17/19 LDL= 87 The goal for LDL is less than 70mg /dl as you are at high risk for complications. Try to avoid saturated fats, trans-fats and eat more fiber. Continue to follow up with your health care provider for any needed lab work     . Obtain Annual Eye (retinal)  Exam    On track    Your last documented eye exam was on 10/19/19 Diabetes can affect your vision.  Plan to have a dilated eye exam every year. Advised client to keep and/ or schedule appointment with eye doctor. Continue to use your eye drops as prescribed.       . Obtain Annual Foot Exam   On track    Your doctor should check your bare feet at each visit. Diabetes can affect the nerves in your feet, causing decreased feeling or numbness. Check your feet and in-between toes daily for cuts, bruises, redness, blisters or sores.  If you cannot reach them, use a mirror. Wash feet with soap and water, dry feet well especially between toes.  Don't use too much lotion. Wear shoes that are not too tight and don't walk barefoot.      . Obtain annual screen for micro albuminuria (urine) , nephropathy (kidney problems)       Diabetes can affect your kidneys. It is important for your doctor to check your urine at least once a year  These tests show how your kidneys are working.      . Obtain Hemoglobin A1C at least 2 times per year       [facility] checked 02/17/19 Continue to follow up with health care provider for any recommended labwork    . Visit Primary Care Provider or Endocrinologist at least 2 times per year        Client saw primary care provider for 2 visits in 2020 Client to follow up with primary care provider 02/26/20       Plan:    RN care manager faxed today's note and updated individualized care plan to primary care provider, mailed updated individualized  care plan to client along with education materials, consent form, advanced directives packet, 24 hour nurse line magnet.  Chronic care management coordination will outreach in:  9-12 months    02/28/20 Nursing/RN Coord [facility] Case Manager, C-SNP  [phone]

## 2020-02-26 ENCOUNTER — Encounter: Payer: Self-pay | Admitting: Internal Medicine

## 2020-02-26 ENCOUNTER — Ambulatory Visit (INDEPENDENT_AMBULATORY_CARE_PROVIDER_SITE_OTHER): Payer: HMO | Admitting: Internal Medicine

## 2020-02-26 ENCOUNTER — Other Ambulatory Visit: Payer: Self-pay

## 2020-02-26 VITALS — BP 144/80 | HR 62 | Temp 98.2°F | Ht 64.0 in | Wt 171.0 lb

## 2020-02-26 DIAGNOSIS — E118 Type 2 diabetes mellitus with unspecified complications: Secondary | ICD-10-CM

## 2020-02-26 DIAGNOSIS — I10 Essential (primary) hypertension: Secondary | ICD-10-CM | POA: Diagnosis not present

## 2020-02-26 DIAGNOSIS — Z0000 Encounter for general adult medical examination without abnormal findings: Secondary | ICD-10-CM | POA: Diagnosis not present

## 2020-02-26 DIAGNOSIS — E785 Hyperlipidemia, unspecified: Secondary | ICD-10-CM

## 2020-02-26 DIAGNOSIS — E039 Hypothyroidism, unspecified: Secondary | ICD-10-CM

## 2020-02-26 DIAGNOSIS — E1169 Type 2 diabetes mellitus with other specified complication: Secondary | ICD-10-CM | POA: Diagnosis not present

## 2020-02-26 LAB — LDL CHOLESTEROL, DIRECT: Direct LDL: 114 mg/dL

## 2020-02-26 LAB — CBC
HCT: 39.9 % (ref 36.0–46.0)
Hemoglobin: 13.3 g/dL (ref 12.0–15.0)
MCHC: 33.2 g/dL (ref 30.0–36.0)
MCV: 86 fl (ref 78.0–100.0)
Platelets: 272 10*3/uL (ref 150.0–400.0)
RBC: 4.64 Mil/uL (ref 3.87–5.11)
RDW: 13.7 % (ref 11.5–15.5)
WBC: 8.3 10*3/uL (ref 4.0–10.5)

## 2020-02-26 LAB — MICROALBUMIN / CREATININE URINE RATIO
Creatinine,U: 104.9 mg/dL
Microalb Creat Ratio: 3.2 mg/g (ref 0.0–30.0)
Microalb, Ur: 3.4 mg/dL — ABNORMAL HIGH (ref 0.0–1.9)

## 2020-02-26 LAB — COMPREHENSIVE METABOLIC PANEL
ALT: 15 U/L (ref 0–35)
AST: 17 U/L (ref 0–37)
Albumin: 4.3 g/dL (ref 3.5–5.2)
Alkaline Phosphatase: 54 U/L (ref 39–117)
BUN: 22 mg/dL (ref 6–23)
CO2: 29 mEq/L (ref 19–32)
Calcium: 10.1 mg/dL (ref 8.4–10.5)
Chloride: 101 mEq/L (ref 96–112)
Creatinine, Ser: 1.11 mg/dL (ref 0.40–1.20)
GFR: 46.34 mL/min — ABNORMAL LOW (ref >60.00)
Glucose, Bld: 89 mg/dL (ref 70–99)
Potassium: 4.5 mEq/L (ref 3.5–5.1)
Sodium: 138 mEq/L (ref 135–145)
Total Bilirubin: 0.6 mg/dL (ref 0.2–1.2)
Total Protein: 8 g/dL (ref 6.0–8.3)

## 2020-02-26 LAB — LIPID PANEL
Cholesterol: 217 mg/dL — ABNORMAL HIGH (ref 0–200)
HDL: 48.1 mg/dL (ref >39.00)
Total CHOL/HDL Ratio: 5
Triglycerides: 410 mg/dL — ABNORMAL HIGH (ref 0.0–149.0)

## 2020-02-26 LAB — HEMOGLOBIN A1C: Hgb A1c MFr Bld: 6.2 % (ref 4.6–6.5)

## 2020-02-26 NOTE — Assessment & Plan Note (Signed)
Flu shot up to date. Covid-19 up to date. Pneumonia complete. Shingrix counseled. Tetanus due 2024. Colonoscopy aged out. Mammogram aged out, pap smear aged out and dexa declines further. Counseled about sun safety and mole surveillance. Counseled about the dangers of distracted driving. Given 10 year screening recommendations.

## 2020-02-26 NOTE — Assessment & Plan Note (Signed)
Taking synthroid and no clinical signs of changes needed to dosing.

## 2020-02-26 NOTE — Assessment & Plan Note (Signed)
Checking lipid panel and adjust pravastatin as needed for LDL goal <70.

## 2020-02-26 NOTE — Assessment & Plan Note (Signed)
Checking HgA1c, foot exam done. Taking metformin and on ARB and statin. Some neuropathy in the feet.

## 2020-02-26 NOTE — Assessment & Plan Note (Signed)
Taking atenolol and amlodipine and losartan with good control. Checking CMP and adjust as needed.

## 2020-02-26 NOTE — Progress Notes (Signed)
Subjective:   Patient ID: Kayla Craig, female    DOB: May 23, 1935, 84 y.o.   MRN: [id number]  HPI Here for medicare wellness and physical, no new complaints. Please see A/P for status and treatment of chronic medical problems.   Diet: DM since diabetic Physical activity: sedentary Depression/mood screen: negative Hearing: intact to whispered voice Visual acuity: grossly normal with lens, performs annual eye exam with Dr. Hazle Quant ADLs: capable Fall risk: low Home safety: good Cognitive evaluation: intact to orientation, naming, recall and repetition EOL planning: adv directives discussed, living will in place    Patient Outreach Telephone from 02/18/2020 in [facility]  PHQ-2 Total Score 0        Office Visit from 01/25/2017 in [facility]  PHQ-9 Total Score 1     I have personally reviewed and have noted 1. The patient's medical and social history - reviewed today no changes 2. Their use of alcohol, tobacco or illicit drugs 3. Their current medications and supplements 4. The patient's functional ability including ADL's, fall risks, home safety risks and hearing or visual impairment. 5. Diet and physical activities 6. Evidence for depression or mood disorders 7. Care team reviewed and updated  Patient Care Team: Myrlene Broker, MD as PCP - General (Internal Medicine) Nelson Chimes, MD (Ophthalmology) Durene Romans, MD (Orthopedic Surgery) Audrie Gallus, RN as [facility] Care Management Past Medical History:  Diagnosis Date  . B12 deficiency   . Back pain    Chronic  . Goiter   . 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 Hypertension   . TMJ (dislocation of temporomandibular joint)    Right Side  . Vitamin D deficiency    Past Surgical History:  Procedure Laterality Date  . EYE SURGERY  2010   cataract surgery with implants  . KNEE SURGERY     Right Arthroscopy '05 Dr Charlann Boxer  . THYROIDECTOMY      August '09 for goiter (Dr Jearld Fenton)  . TUBAL LIGATION     Family History  Problem Relation Age of Onset  . Cancer Mother   . Heart disease Father        CAD/MI  . Heart disease Brother        CAD/MI  . Heart disease Brother        CAD/MI  . Cancer Brother   . Diabetes Neg Hx    Review of Systems  Constitutional: Negative.   HENT: Negative.   Eyes: Negative.   Respiratory: Negative for cough, chest tightness and shortness of breath.   Cardiovascular: Negative for chest pain, palpitations and leg swelling.  Gastrointestinal: Negative for abdominal distention, abdominal pain, constipation, diarrhea, nausea and vomiting.  Musculoskeletal: Positive for arthralgias.  Skin: Negative.   Neurological: Negative.   Psychiatric/Behavioral: Negative.     Objective:  Physical Exam Constitutional:      Appearance: She is well-developed.  HENT:     Head: Normocephalic and atraumatic.  Cardiovascular:     Rate and Rhythm: Normal rate and regular rhythm.  Pulmonary:     Effort: Pulmonary effort is normal. No respiratory distress.     Breath sounds: Normal breath sounds. No wheezing or rales.  Abdominal:     General: Bowel sounds are normal. There is no distension.     Palpations: Abdomen is soft.     Tenderness: There is no abdominal tenderness. There is no rebound.  Musculoskeletal:  Cervical back: Normal range of motion.  Skin:    General: Skin is warm and dry.     Comments: See foot exam  Neurological:     Mental Status: She is alert and oriented to person, place, and time.     Coordination: Coordination normal.     Vitals:   02/26/20 1338  BP: (!) 144/80  Pulse: 62  Temp: 98.2 F (36.8 C)  TempSrc: Oral  SpO2: 97%  Weight: 171 lb (77.6 kg)  Height: 5\' 4"  (1.626 m)   This visit occurred during the SARS-CoV-2 public health emergency.  Safety protocols were in place, including screening questions prior to the visit, additional usage of staff PPE, and extensive  cleaning of exam room while observing appropriate contact time as indicated for disinfecting solutions.   Assessment & Plan:

## 2020-02-26 NOTE — Patient Instructions (Signed)
Health Maintenance, Female Adopting a healthy lifestyle and getting preventive care are important in promoting health and wellness. Ask your health care provider about:  The right schedule for you to have regular tests and exams.  Things you can do on your own to prevent diseases and keep yourself healthy. What should I know about diet, weight, and exercise? Eat a healthy diet   Eat a diet that includes plenty of vegetables, fruits, low-fat dairy products, and lean protein.  Do not eat a lot of foods that are high in solid fats, added sugars, or sodium. Maintain a healthy weight Body mass index (BMI) is used to identify weight problems. It estimates body fat based on height and weight. Your health care provider can help determine your BMI and help you achieve or maintain a healthy weight. Get regular exercise Get regular exercise. This is one of the most important things you can do for your health. Most adults should:  Exercise for at least 150 minutes each week. The exercise should increase your heart rate and make you sweat (moderate-intensity exercise).  Do strengthening exercises at least twice a week. This is in addition to the moderate-intensity exercise.  Spend less time sitting. Even light physical activity can be beneficial. Watch cholesterol and blood lipids Have your blood tested for lipids and cholesterol at 82 years of age, then have this test every 5 years. Have your cholesterol levels checked more often if:  Your lipid or cholesterol levels are high.  You are older than 82 years of age.  You are at high risk for heart disease. What should I know about cancer screening? Depending on your health history and family history, you may need to have cancer screening at various ages. This may include screening for:  Breast cancer.  Cervical cancer.  Colorectal cancer.  Skin cancer.  Lung cancer. What should I know about heart disease, diabetes, and high blood  pressure? Blood pressure and heart disease  High blood pressure causes heart disease and increases the risk of stroke. This is more likely to develop in people who have high blood pressure readings, are of African descent, or are overweight.  Have your blood pressure checked: ? Every 3-5 years if you are 18-39 years of age. ? Every year if you are 40 years old or older. Diabetes Have regular diabetes screenings. This checks your fasting blood sugar level. Have the screening done:  Once every three years after age 40 if you are at a normal weight and have a low risk for diabetes.  More often and at a younger age if you are overweight or have a high risk for diabetes. What should I know about preventing infection? Hepatitis B If you have a higher risk for hepatitis B, you should be screened for this virus. Talk with your health care provider to find out if you are at risk for hepatitis B infection. Hepatitis C Testing is recommended for:  Everyone born from 1945 through 1965.  Anyone with known risk factors for hepatitis C. Sexually transmitted infections (STIs)  Get screened for STIs, including gonorrhea and chlamydia, if: ? You are sexually active and are younger than 82 years of age. ? You are older than 82 years of age and your health care provider tells you that you are at risk for this type of infection. ? Your sexual activity has changed since you were last screened, and you are at increased risk for chlamydia or gonorrhea. Ask your health care provider if   you are at risk.  Ask your health care provider about whether you are at high risk for HIV. Your health care provider may recommend a prescription medicine to help prevent HIV infection. If you choose to take medicine to prevent HIV, you should first get tested for HIV. You should then be tested every 3 months for as long as you are taking the medicine. Pregnancy  If you are about to stop having your period (premenopausal) and  you may become pregnant, seek counseling before you get pregnant.  Take 400 to 800 micrograms (mcg) of folic acid every day if you become pregnant.  Ask for birth control (contraception) if you want to prevent pregnancy. Osteoporosis and menopause Osteoporosis is a disease in which the bones lose minerals and strength with aging. This can result in bone fractures. If you are 65 years old or older, or if you are at risk for osteoporosis and fractures, ask your health care provider if you should:  Be screened for bone loss.  Take a calcium or vitamin D supplement to lower your risk of fractures.  Be given hormone replacement therapy (HRT) to treat symptoms of menopause. Follow these instructions at home: Lifestyle  Do not use any products that contain nicotine or tobacco, such as cigarettes, e-cigarettes, and chewing tobacco. If you need help quitting, ask your health care provider.  Do not use street drugs.  Do not share needles.  Ask your health care provider for help if you need support or information about quitting drugs. Alcohol use  Do not drink alcohol if: ? Your health care provider tells you not to drink. ? You are pregnant, may be pregnant, or are planning to become pregnant.  If you drink alcohol: ? Limit how much you use to 0-1 drink a day. ? Limit intake if you are breastfeeding.  Be aware of how much alcohol is in your drink. In the [place] one drink equals one 12 oz bottle of beer (355 mL), one 5 oz glass of wine (148 mL), or one 1 oz glass of hard liquor (44 mL). General instructions  Schedule regular health, dental, and eye exams.  Stay current with your vaccines.  Tell your health care provider if: ? You often feel depressed. ? You have ever been abused or do not feel safe at home. Summary  Adopting a healthy lifestyle and getting preventive care are important in promoting health and wellness.  Follow your health care provider's instructions about healthy  diet, exercising, and getting tested or screened for diseases.  Follow your health care provider's instructions on monitoring your cholesterol and blood pressure. This information is not intended to replace advice given to you by your health care provider. Make sure you discuss any questions you have with your health care provider. Document Revised: 03/19/2018 Document Reviewed: 03/19/2018 Elsevier Patient Education  2020 [employer]  

## 2020-03-07 ENCOUNTER — Other Ambulatory Visit: Payer: Self-pay | Admitting: *Deleted

## 2020-03-07 NOTE — Patient Outreach (Signed)
  [facility]) Care Management Chronic Special Needs Program    03/07/2020  Name: Kayla Craig, DOB: [birth year]  MRN: [id number]   [name] is enrolled in a chronic special needs plan for Diabetes.  [facility] care management will continue to provide services for this member through 04/08/20.  [facility] care management team will assume care 04/09/20.   [name] [facility], BSN [facility] RN Care Coordinator, CSNP [phone]

## 2020-03-15 ENCOUNTER — Encounter: Payer: Self-pay | Admitting: Cardiovascular Disease

## 2020-03-15 ENCOUNTER — Ambulatory Visit: Payer: HMO | Admitting: Cardiovascular Disease

## 2020-03-15 ENCOUNTER — Other Ambulatory Visit: Payer: Self-pay

## 2020-03-15 VITALS — BP 186/82 | HR 63 | Ht 64.0 in | Wt 172.6 lb

## 2020-03-15 DIAGNOSIS — I10 Essential (primary) hypertension: Secondary | ICD-10-CM | POA: Diagnosis not present

## 2020-03-15 DIAGNOSIS — E785 Hyperlipidemia, unspecified: Secondary | ICD-10-CM

## 2020-03-15 DIAGNOSIS — I739 Peripheral vascular disease, unspecified: Secondary | ICD-10-CM | POA: Diagnosis not present

## 2020-03-15 MED ORDER — CARVEDILOL 6.25 MG PO TABS: 6.2500 mg | ORAL_TABLET | Freq: Two times a day (BID) | ORAL | 1 refills | Status: DC

## 2020-03-15 NOTE — Progress Notes (Signed)
Cardiology Office Note   Date:  03/15/2020   ID:  Kayla Craig, DOB [birth year], MRN [id number]  PCP:  Myrlene Broker, MD  Cardiologist:   Lorine Bears, MD   No chief complaint on file.     History of Present Illness: Kayla Craig is a 84 y.o. female who is here today for follow-up visit regarding peripheral arterial disease. She has no prior cardiac history.  She has multiple chronic medical conditions including essential hypertension, type 2 diabetes, hyperlipidemia, GERD and osteoarthritis.  She suffers from hammertoes on both sides but with significant discomfort affecting the right second toe with some discoloration.  She was initially planning to have surgery done and was referred for vascular evaluation before that. She underwent noninvasive vascular evaluation which showed an ABI of 0.78 on the right and 0.65 on the left. Duplex showed severe stenosis in the right popliteal artery with occluded posterior tibial artery.  On the left, there was also severe stenosis in the popliteal artery extending into the TP trunk with occluded posterior tibial artery. She is doing reasonably well and she is mostly bothered by severe knee arthritis especially on the right side.  She denies calf claudication.  She has no ulceration.  Past Medical History:  Diagnosis Date  . B12 deficiency   . Back pain    Chronic  . Goiter   . 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 Hypertension   . TMJ (dislocation of temporomandibular joint)    Right Side  . Vitamin D deficiency     Past Surgical History:  Procedure Laterality Date  . EYE SURGERY  2010   cataract surgery with implants  . KNEE SURGERY     Right Arthroscopy '05 Dr Charlann Boxer  . THYROIDECTOMY     August '09 for goiter (Dr Jearld Fenton)  . TUBAL LIGATION       Current Outpatient Medications  Medication Sig Dispense Refill  . amLODipine (NORVASC) 10 MG tablet Take 1 tablet by mouth once daily 90 tablet 2  . aspirin  81 MG EC tablet Take 81 mg by mouth daily.      . Calcium Carbonate-Vit D-Min (CALCIUM 1200 PO) Take by mouth.    . Cholecalciferol (VITAMIN D-3) 1000 UNITS CAPS Take 1 capsule by mouth daily.    Marland Kitchen levothyroxine (SYNTHROID) 50 MCG tablet TAKE 1 TABLET BY MOUTH ONCE DAILY BEFORE BREAKFAST 90 tablet 3  . losartan (COZAAR) 100 MG tablet Take 1 tablet (100 mg total) by mouth daily. 30 tablet 0  . metFORMIN (GLUCOPHAGE) 500 MG tablet Take 1 tablet (500 mg total) by mouth daily with breakfast. 90 tablet 3  . pantoprazole (PROTONIX) 40 MG tablet Take 1 tablet (40 mg total) by mouth daily. 90 tablet 3  . pravastatin (PRAVACHOL) 20 MG tablet Take 1 tablet (20 mg total) by mouth daily. 90 tablet 3  . vitamin B-12 (CYANOCOBALAMIN) 1000 MCG tablet Take 1,000 mcg by mouth daily.      . carvedilol (COREG) 6.25 MG tablet Take 1 tablet (6.25 mg total) by mouth 2 (two) times daily. 180 tablet 1   No current facility-administered medications for this visit.    Allergies:   Codeine    Social History:  The patient  reports that she has never smoked. She has never used smokeless tobacco. She reports that she does not drink alcohol and does not use drugs.   Family History:  The patient's family history includes Cancer  in her brother and mother; Heart disease in her brother, brother, and father.    ROS:  Please see the history of present illness.   Otherwise, review of systems are positive for none.   All other systems are reviewed and negative.    PHYSICAL EXAM: VS:  BP (!) 186/82   Pulse 63   Ht 5\' 4"  (1.626 m)   Wt 172 lb 9.6 oz (78.3 kg)   SpO2 97%   BMI 29.63 kg/m  , BMI Body mass index is 29.63 kg/m. GEN: Well nourished, well developed, in no acute distress  HEENT: normal  Neck: no JVD, carotid bruits, or masses Cardiac: RRR; no murmurs, rubs, or gallops,no edema  Respiratory:  clear to auscultation bilaterally, normal work of breathing GI: soft, nontender, nondistended, + BS MS: no deformity  or atrophy  Skin: warm and dry, no rash Neuro:  Strength and sensation are intact Psych: euthymic mood, full affect Vascular: Radial pulses normal bilaterally.  Posterior tibial is not palpable.  Dorsalis pedis is +1 bilaterally.   EKG:  EKG is not ordered today.    Recent Labs: 02/26/2020: ALT 15; BUN 22; Creatinine, Ser 1.11; Hemoglobin 13.3; Platelets 272.0; Potassium 4.5; Sodium 138    Lipid Panel    Component Value Date/Time   CHOL 217 (H) 02/26/2020 1417   TRIG (H) 02/26/2020 1417    410.0 Triglyceride is over 400; calculations on Lipids are invalid.   HDL 48.10 02/26/2020 1417   CHOLHDL 5 02/26/2020 1417   VLDL 78.8 (H) 05/13/2018 1039   LDLDIRECT 114.0 02/26/2020 1417      Wt Readings from Last 3 Encounters:  03/15/20 172 lb 9.6 oz (78.3 kg)  02/26/20 171 lb (77.6 kg)  12/15/19 172 lb 3.2 oz (78.1 kg)      No flowsheet data found.    ASSESSMENT AND PLAN:  1.  Peripheral arterial disease: The patient has evidence of severe popliteal artery stenosis bilaterally slightly worse on the left side given the extension into the TP trunk.  Currently she has no open ulceration and she is mostly bothered by hammertoes.  The patient does not want to have any foot surgery.  Given that she is not having any symptoms related to peripheral arterial disease at the present time, I recommend continuing medical therapy.   2.  Essential hypertension: Blood pressure continues to be elevated in spite of atenolol, losartan and amlodipine.  I elected to switch atenolol to carvedilol for better blood pressure control.  If blood pressure remains elevated in spite of 3 antihypertensive medications, renal artery duplex will be considered.  3.  Hyperlipidemia: Currently on pravastatin 20 mg daily.  Consider a more potent statin.     Disposition:   FU with me in 6 months  Signed,  02/14/20, MD  03/15/2020 10:24 AM    [facility]

## 2020-03-15 NOTE — Patient Instructions (Signed)
Medication Instructions:  STOP the Atenolol START Carvedilol 6.125 mg twice daily  *If you need a refill on your cardiac medications before your next appointment, please call your pharmacy*   Lab Work: None ordered If you have labs (blood work) drawn today and your tests are completely normal, you will receive your results only by: Marland Kitchen MyChart Message (if you have MyChart) OR . A paper copy in the mail If you have any lab test that is abnormal or we need to change your treatment, we will call you to review the results.   Testing/Procedures: None ordered   Follow-Up: At [facility], you and your health needs are our priority.  As part of our continuing mission to provide you with exceptional heart care, we have created designated Provider Care Teams.  These Care Teams include your primary Cardiologist (physician) and Advanced Practice Providers (APPs -  Physician Assistants and Nurse Practitioners) who all work together to provide you with the care you need, when you need it.  We recommend signing up for the patient portal called "MyChart".  Sign up information is provided on this After Visit Summary.  MyChart is used to connect with patients for Virtual Visits (Telemedicine).  Patients are able to view lab/test results, encounter notes, upcoming appointments, etc.  Non-urgent messages can be sent to your provider as well.   To learn more about what you can do with MyChart, go to [email or web address].    Your next appointment:   6 month(s)  The format for your next appointment:   In Person  Provider:   Lorine Bears, MD

## 2020-03-23 ENCOUNTER — Other Ambulatory Visit: Payer: Self-pay | Admitting: Internal Medicine

## 2020-03-28 ENCOUNTER — Telehealth: Payer: Self-pay | Admitting: Internal Medicine

## 2020-03-28 NOTE — Telephone Encounter (Signed)
Patient called and was wondering if she needed to stop taking amLODipine (NORVASC) 10 MG tablet losartan (COZAAR) 100 MG tablet  atenolol (TENORMIN) 50 MG tablet  She said that her Cardiologist recently prescribed her carvedilol (COREG) 6.25 MG tablet  Please call her at [phone].

## 2020-03-29 NOTE — Telephone Encounter (Signed)
Pt informed of below.  

## 2020-03-29 NOTE — Telephone Encounter (Signed)
If her cardiologist has changed her medications she should call them to confirm which to take and stop. Otherwise can come in for a visit and we can discuss.

## 2020-04-06 ENCOUNTER — Telehealth: Payer: Self-pay | Admitting: Cardiovascular Disease

## 2020-04-06 NOTE — Telephone Encounter (Signed)
Spoke to patient she wanted to know what medications she was suppose to stop.Advised at last visit with Dr.Arida he advised to stop Atenolol and start Carvedilol 6.25 mg twice a day.Advised continue all other medications.Advised patient to monitor B/P daily.She will purchase a home omron B/P monitor.Advised to call back if B/P continues to be elevated.

## 2020-04-06 NOTE — Telephone Encounter (Signed)
Patient is calling wanting to see if she is taking too much blood pressure medication. Wants nurse to call back to discuss.

## 2020-04-12 ENCOUNTER — Other Ambulatory Visit: Payer: Self-pay | Admitting: Internal Medicine

## 2020-04-13 ENCOUNTER — Other Ambulatory Visit: Payer: Self-pay | Admitting: *Deleted

## 2020-04-20 ENCOUNTER — Other Ambulatory Visit: Payer: Self-pay | Admitting: Internal Medicine

## 2020-05-26 ENCOUNTER — Other Ambulatory Visit: Payer: Self-pay | Admitting: Internal Medicine

## 2020-06-22 ENCOUNTER — Telehealth: Payer: Self-pay | Admitting: Internal Medicine

## 2020-06-22 ENCOUNTER — Telehealth (INDEPENDENT_AMBULATORY_CARE_PROVIDER_SITE_OTHER): Payer: HMO | Admitting: Family

## 2020-06-22 DIAGNOSIS — J069 Acute upper respiratory infection, unspecified: Secondary | ICD-10-CM | POA: Diagnosis not present

## 2020-06-22 MED ORDER — FLUTICASONE PROPIONATE 50 MCG/ACT NA SUSP: 2.0000 | Freq: Every day | NASAL | 0 refills | Status: DC

## 2020-06-22 MED ORDER — BENZONATATE 100 MG PO CAPS: 100.0000 mg | ORAL_CAPSULE | Freq: Three times a day (TID) | ORAL | 0 refills | Status: DC | PRN

## 2020-06-22 NOTE — Telephone Encounter (Signed)
[facility]   Coughing, runny nose, and temp of 99 for 2 days. Patient mentioned her stomach was sore from coughing so much.   Advised to see PCP. Called to schedule a virtual visit.

## 2020-06-22 NOTE — Progress Notes (Signed)
Kayla Craig is a 85 y.o. female with the following history as recorded in EpicCare:  Patient Active Problem List   Diagnosis Date Noted  . Diabetes mellitus type 2 with complications (HCC) 02/26/2020  . Primary osteoarthritis involving multiple joints 02/17/2019  . Routine health maintenance 05/10/2012  . Hyperlipidemia associated with type 2 diabetes mellitus (HCC) 10/20/2009  . GERD 03/25/2008  . Hypothyroidism 02/05/2007  . B12 deficiency 02/05/2007  . VITAMIN D DEFICIENCY 02/05/2007  . Essential hypertension, benign 02/05/2007    Current Outpatient Medications  Medication Sig Dispense Refill  . benzonatate (TESSALON) 100 MG capsule Take 1 capsule (100 mg total) by mouth 3 (three) times daily as needed. 20 capsule 0  . fluticasone (FLONASE) 50 MCG/ACT nasal spray Place 2 sprays into both nostrils daily. 16 g 0  . amLODipine (NORVASC) 10 MG tablet Take 1 tablet by mouth once daily 90 tablet 0  . aspirin 81 MG EC tablet Take 81 mg by mouth daily.      . Calcium Carbonate-Vit D-Min (CALCIUM 1200 PO) Take by mouth.    . carvedilol (COREG) 6.25 MG tablet Take 1 tablet (6.25 mg total) by mouth 2 (two) times daily. 180 tablet 1  . Cholecalciferol (VITAMIN D-3) 1000 UNITS CAPS Take 1 capsule by mouth daily.    Kayla Craig 50 MCG tablet TAKE 1 TABLET BY MOUTH ONCE DAILY BEFORE BREAKFAST 90 tablet 0  . losartan (COZAAR) 100 MG tablet Take 1 tablet (100 mg total) by mouth daily. 30 tablet 0  . metFORMIN (GLUCOPHAGE) 500 MG tablet Take 1 tablet by mouth once daily with breakfast 90 tablet 2  . pantoprazole (PROTONIX) 40 MG tablet Take 1 tablet (40 mg total) by mouth daily. 90 tablet 3  . pravastatin (PRAVACHOL) 20 MG tablet Take 1 tablet by mouth once daily 90 tablet 0  . vitamin B-12 (CYANOCOBALAMIN) 1000 MCG tablet Take 1,000 mcg by mouth daily.       No current facility-administered medications for this visit.    Allergies: Codeine  Past Medical History:  Diagnosis Date  . B12  deficiency   . Back pain    Chronic  . Goiter   . 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 Hypertension   . TMJ (dislocation of temporomandibular joint)    Right Side  . Vitamin D deficiency     Past Surgical History:  Procedure Laterality Date  . EYE SURGERY  2010   cataract surgery with implants  . KNEE SURGERY     Right Arthroscopy '05 Dr Kayla Craig  . THYROIDECTOMY     Craig '09 for goiter (Dr Kayla Craig)  . TUBAL LIGATION      Family History  Problem Relation Age of Onset  . Cancer Mother   . Heart disease Father        CAD/MI  . Heart disease Brother        CAD/MI  . Heart disease Brother        CAD/MI  . Cancer Brother   . Diabetes Neg Hx     Social History   Tobacco Use  . Smoking status: Never Smoker  . Smokeless tobacco: Never Used  Substance Use Topics  . Alcohol use: No    Alcohol/week: 0.0 standard drinks    Subjective:   I connected with Kayla Craig on 06/22/20 at 10:00 AM EDT by a telephone call  and verified that I am speaking with the correct person using two identifiers.  I discussed the limitations of evaluation and management by telemedicine and the availability of in person appointments. The patient expressed understanding and agreed to proceed. Provider in office/ patient is at home; provider and patient are only 2 people on telephone call.   2 day history of cough/ congestion runny nose; temperature was 99 yesterday but has normalized by today; notes that her abdomen and chest are "sore" from coughing/ having some urinary issues due to the coughing; no concerns for COVID exposure- has not been around anyone; not currently taking any medication; no chest pain, no shortness of breath;      Objective:  There were no vitals filed for this visit.   Lungs: Respirations unlabored;  Neurologic: Alert and oriented; speech intact; face symmetrical;   Assessment:  1. Viral URI with cough     Plan:  Defers COVID testing; do not think  antibiotics are warranted; symptomatic treatment discussed including fluids, rest; will send in [facility] and Flonase; follow up worse, no better.   Time spent 8 minutes   No follow-ups on file.  No orders of the defined types were placed in this encounter.   Requested Prescriptions   Signed Prescriptions Disp Refills  . benzonatate (TESSALON) 100 MG capsule 20 capsule 0    Sig: Take 1 capsule (100 mg total) by mouth 3 (three) times daily as needed.  . fluticasone (FLONASE) 50 MCG/ACT nasal spray 16 g 0    Sig: Place 2 sprays into both nostrils daily.

## 2020-06-24 ENCOUNTER — Telehealth: Payer: Self-pay | Admitting: Internal Medicine

## 2020-06-24 MED ORDER — DOXYCYCLINE HYCLATE 100 MG PO TABS: 100.0000 mg | ORAL_TABLET | Freq: Two times a day (BID) | ORAL | 0 refills | Status: DC

## 2020-06-24 NOTE — Telephone Encounter (Signed)
[facility]   Caller states: Coughing for 4 days and taking Tessalon perles and has been taking them for 2 days and her sides are hurting and she is messing her pants.  Advised to see PCP within 24 Hours. Patient was seen yesterday with Kayla Craig.    Please advise.

## 2020-06-24 NOTE — Telephone Encounter (Signed)
   Patient calling to report she call EMS last night due to constant coughing and trouble breathing, she did not go to the hospital. Patient states the benzonatate (TESSALON) 100 MG capsule is not helping her cough.  Patient requesting call from [facility] Patient also requesting antibiotic

## 2020-06-24 NOTE — Telephone Encounter (Signed)
I have called the pt to check on how she is doing. Pt is speaking in full sentices, however she is reporting shortness of breath while she is moving around. She stated that as long as she is sitting down she is fine. She reports that her 02 status today stayed in the low 90s and she is getting tired more often.   Pt reports that she will try to get a covid-19 test and take the antibiotics to see how well it helps.   I will call back on Monday to follow up and see how the pt is doing.

## 2020-06-24 NOTE — Addendum Note (Signed)
Addended by: Eustace Moore on: 06/24/2020 11:54 AM   Modules accepted: Orders

## 2020-06-24 NOTE — Telephone Encounter (Signed)
See below

## 2020-06-24 NOTE — Telephone Encounter (Addendum)
Can you call her and find out what EMS found on her exam? How is she breathing today? Will call in antibiotic for her as well but would recommend that she re-consider having a COVID test done as well.

## 2020-06-27 ENCOUNTER — Ambulatory Visit (INDEPENDENT_AMBULATORY_CARE_PROVIDER_SITE_OTHER): Payer: HMO | Admitting: Internal Medicine

## 2020-06-27 ENCOUNTER — Other Ambulatory Visit: Payer: Self-pay

## 2020-06-27 ENCOUNTER — Encounter: Payer: Self-pay | Admitting: Internal Medicine

## 2020-06-27 DIAGNOSIS — J069 Acute upper respiratory infection, unspecified: Secondary | ICD-10-CM | POA: Diagnosis not present

## 2020-06-27 MED ORDER — LEVOCETIRIZINE DIHYDROCHLORIDE 5 MG PO TABS: 5.0000 mg | ORAL_TABLET | Freq: Every evening | ORAL | 0 refills | Status: DC

## 2020-06-27 MED ORDER — AMOXICILLIN-POT CLAVULANATE 875-125 MG PO TABS: 1.0000 | ORAL_TABLET | Freq: Two times a day (BID) | ORAL | 0 refills | Status: AC

## 2020-06-27 NOTE — Telephone Encounter (Signed)
Please schedule in office with Dr. Okey Dupre if she wants to be seen;  ( Negative COVID);

## 2020-06-27 NOTE — Progress Notes (Signed)
   Subjective:   Patient ID: Kayla Craig, female    DOB: [birth year], 84 y.o.   MRN: [id number]  HPI The patient is an 85 YO female coming in for concerns about cough and sinus congestion. Started about 2 weeks ago or so. Initially was having a lot of cough and SOB. She states the SOB comes and goes but cannot describe if this is worsening or not. Denies SOB with walking over flat ground. Some SOB with strenuous activity. Started on tessalon perles and flonase by another provider after acute visit. Then she called back not feeling improved and given rx doxycycline. She did not take this as she was concerned about side effects listed as possible. She feels her lungs are clear. States coughing incessantly. Denies fevers or chills. She is taking flonase and feels that this has helped but she cannot say how it helped.   Review of Systems  Constitutional: Positive for activity change and appetite change. Negative for fatigue, fever and unexpected weight change.  HENT: Positive for congestion, postnasal drip, rhinorrhea and sinus pressure. Negative for ear discharge, ear pain, sinus pain, sneezing, sore throat, tinnitus, trouble swallowing and voice change.   Eyes: Negative.   Respiratory: Positive for cough and shortness of breath. Negative for chest tightness and wheezing.   Cardiovascular: Negative.  Negative for chest pain, palpitations and leg swelling.  Gastrointestinal: Negative.  Negative for abdominal distention, abdominal pain, constipation, diarrhea, nausea and vomiting.  Musculoskeletal: Negative.   Skin: Negative.   Neurological: Negative.   Psychiatric/Behavioral: Negative.     Objective:  Physical Exam Constitutional:      Appearance: She is well-developed.  HENT:     Head: Normocephalic and atraumatic.     Right Ear: Tympanic membrane normal.     Left Ear: Tympanic membrane normal.     Nose: Congestion present.     Mouth/Throat:     Pharynx: Posterior oropharyngeal erythema  present.     Comments: Mild erythema and clear drainage Cardiovascular:     Rate and Rhythm: Normal rate and regular rhythm.  Pulmonary:     Effort: Pulmonary effort is normal. No respiratory distress.     Breath sounds: Normal breath sounds. No wheezing or rales.     Comments: No coughing during our entire visit, no dyspnea during exam even with extended talking and 100 feet walking. Abdominal:     General: Bowel sounds are normal. There is no distension.     Palpations: Abdomen is soft.     Tenderness: There is no abdominal tenderness. There is no rebound.  Musculoskeletal:     Cervical back: Normal range of motion.  Skin:    General: Skin is warm and dry.  Neurological:     Mental Status: She is alert and oriented to person, place, and time.     Coordination: Coordination normal.     Vitals:   06/27/20 1442  BP: 128/74  Pulse: 91  Resp: 18  Temp: 98.4 F (36.9 C)  TempSrc: Oral  SpO2: 93%  Weight: 164 lb 3.2 oz (74.5 kg)  Height: 5\' 4"  (1.626 m)    This visit occurred during the SARS-CoV-2 public health emergency.  Safety protocols were in place, including screening questions prior to the visit, additional usage of staff PPE, and extensive cleaning of exam room while observing appropriate contact time as indicated for disinfecting solutions.   Assessment & Plan:

## 2020-06-27 NOTE — Patient Instructions (Addendum)
We will have you keep taking the flonase nose spray 2 sprays each side once a day.   We have sent in xyzal to take 1 pill daily to help for the next 1-2 weeks.  We have sent in augmentin to start taking Wednesday or Thursday this week if no improvement.

## 2020-06-27 NOTE — Telephone Encounter (Signed)
Called and go pt scheduled an appointment with Kayla Craig later today. Pt made and confirmed appointment.

## 2020-06-27 NOTE — Telephone Encounter (Signed)
Gave pt a call today to follow up. Pt reports she is feeling better from Thursday, however she is still having the cough and headaches. The same ones that she was having for the last couple of weeks. Her Covid test was Neg and after reading the side effects of the Doxy she decided not to take it. She has been doing an nasal spray and cough peals, but that has not helped at all.   She sounds much better and clearer. She did not sound distressed and did not report any SHOB today.   Please advise on the next step.

## 2020-06-27 NOTE — Assessment & Plan Note (Signed)
Suspect resolving URI. Rx xyzal and continue flonase. Rx augmentin to start taking in 2-3 days if no improvement. She requests z-pack but this is not recommended for sinus infection.

## 2020-07-12 ENCOUNTER — Other Ambulatory Visit: Payer: Self-pay | Admitting: Internal Medicine

## 2020-08-15 ENCOUNTER — Other Ambulatory Visit: Payer: Self-pay | Admitting: Internal Medicine

## 2020-08-29 ENCOUNTER — Telehealth: Payer: Self-pay | Admitting: Internal Medicine

## 2020-08-29 NOTE — Progress Notes (Signed)
  Chronic Care Management   Note  08/29/2020 Name: Kayla Craig MRN: [id number] DOB: 10/04/1935  Kayla Craig is a 85 y.o. year old female who is a primary care patient of Myrlene Broker, MD. I reached out to Kayla Craig by phone today in response to a referral sent by Kayla Craig's PCP, Myrlene Broker, MD.   Kayla Craig was given information about Chronic Care Management services today including:  1. CCM service includes personalized support from designated clinical staff supervised by her physician, including individualized plan of care and coordination with other care providers 2. 24/7 contact phone numbers for assistance for urgent and routine care needs. 3. Service will only be billed when office clinical staff spend 20 minutes or more in a month to coordinate care. 4. Only one practitioner may furnish and bill the service in a calendar month. 5. The patient may stop CCM services at any time (effective at the end of the month) by phone call to the office staff.   Patient agreed to services and verbal consent obtained.   Follow up plan:   Alvie Heidelberg [facility] Scheduler

## 2020-09-13 NOTE — Progress Notes (Signed)
Chronic Care Management Pharmacy Note  09/14/2020 Name:  Kayla Craig MRN:  [id number] DOB:  10-Jan-1936  Subjective: Kayla Craig is an 85 y.o. year old female who is a primary patient of [name], MD.  The CCM team was consulted for assistance with disease management and care coordination needs.    Engaged with patient by telephone for initial visit in response to provider referral for pharmacy case management and/or care coordination services.   Consent to Services:  The patient was given the following information about Chronic Care Management services today, agreed to services, and gave verbal consent: 1. CCM service includes personalized support from designated clinical staff supervised by the primary care provider, including individualized plan of care and coordination with other care providers 2. 24/7 contact phone numbers for assistance for urgent and routine care needs. 3. Service will only be billed when office clinical staff spend 20 minutes or more in a month to coordinate care. 4. Only one practitioner may furnish and bill the service in a calendar month. 5.The patient may stop CCM services at any time (effective at the end of the month) by phone call to the office staff. 6. The patient will be responsible for cost sharing (co-pay) of up to 20% of the service fee (after annual deductible is met). Patient agreed to services and consent obtained.  Patient Care Team: [name], MD as PCP - General (Internal Medicine) Calvert Cantor, MD (Ophthalmology) Paralee Cancel, MD (Orthopedic Surgery) Delice Bison Darnelle Maffucci, [facility] as Pharmacist (Pharmacist)  Recent office visits: 06/27/2020 - PCP visit - cough and sinus congestion - started on Augmentin and levocetirizine  06/23/2019 - Jodi Mourning - Viral URI - prescribed benzonatate and fluticasone nasal spray   Recent consult visits: 03/15/2020 - Dr. Fletcher Anon - Cardiology - PAD and HTN - coreg prescribed in place of atenolol    Hospital visits: None in previous 6 months  Objective:  Lab Results  Component Value Date   CREATININE 1.11 02/26/2020   BUN 22 02/26/2020   GFR 46.34 (L) 02/26/2020   GFRNONAA 83 (L) 12/27/2012   GFRAA >90 12/27/2012   NA 138 02/26/2020   K 4.5 02/26/2020   CALCIUM 10.1 02/26/2020   CO2 29 02/26/2020   GLUCOSE 89 02/26/2020    Lab Results  Component Value Date/Time   HGBA1C 6.2 02/26/2020 02:17 PM   HGBA1C 8.1 (H) 02/17/2019 01:25 PM   GFR 46.34 (L) 02/26/2020 02:17 PM   GFR 61.62 02/17/2019 01:25 PM   MICROALBUR 3.4 (H) 02/26/2020 02:17 PM    Last diabetic Eye exam:  No results found for: HMDIABEYEEXA  Last diabetic Foot exam:  No results found for: HMDIABFOOTEX   Lab Results  Component Value Date   CHOL 217 (H) 02/26/2020   HDL 48.10 02/26/2020   LDLDIRECT 114.0 02/26/2020   TRIG (H) 02/26/2020    410.0 Triglyceride is over 400; calculations on Lipids are invalid.   CHOLHDL 5 02/26/2020    Hepatic Function Latest Ref Rng & Units 02/26/2020 02/17/2019 05/13/2018  Total Protein 6.0 - 8.3 g/dL 8.0 7.7 7.5  Albumin 3.5 - 5.2 g/dL 4.3 4.3 4.4  AST 0 - 37 U/L 17 21 14   ALT 0 - 35 U/L 15 25 20   Alk Phosphatase 39 - 117 U/L 54 64 62  Total Bilirubin 0.2 - 1.2 mg/dL 0.6 0.5 0.7  Bilirubin, Direct 0.0 - 0.3 mg/dL - - -    Lab Results  Component Value Date/Time  TSH 2.55 02/17/2019 01:25 PM   TSH 1.58 02/12/2018 10:48 AM   FREET4 0.81 02/17/2019 01:25 PM   FREET4 0.75 02/12/2018 10:48 AM    CBC Latest Ref Rng & Units 02/26/2020 02/17/2019 02/12/2018  WBC 4.0 - 10.5 K/uL 8.3 7.5 5.5  Hemoglobin 12.0 - 15.0 g/dL 13.3 13.0 13.6  Hematocrit 36.0 - 46.0 % 39.9 38.3 40.5  Platelets 150.0 - 400.0 K/uL 272.0 250.0 228.0    Lab Results  Component Value Date/Time   VD25OH 23.53 (L) 02/17/2019 01:25 PM   VD25OH 27 (L) 10/20/2009 08:02 PM    Clinical ASCVD: No  The ASCVD Risk score Mikey Bussing DC Jr., et al., 2013) failed to calculate for the following reasons:    The 2013 ASCVD risk score is only valid for ages 84 to 13    Depression screen PHQ 2/9 02/18/2020 02/17/2019 01/28/2018  Decreased Interest 0 0 0  Down, Depressed, Hopeless 0 0 0  PHQ - 2 Score 0 0 0  Altered sleeping - - -  Tired, decreased energy - - -  Change in appetite - - -  Feeling bad or failure about yourself  - - -  Trouble concentrating - - -  Moving slowly or fidgety/restless - - -  Suicidal thoughts - - -  PHQ-9 Score - - -  Difficult doing work/chores - - -     Social History   Tobacco Use  Smoking Status Never Smoker  Smokeless Tobacco Never Used   BP Readings from Last 3 Encounters:  06/27/20 128/74  03/15/20 (!) 186/82  02/26/20 (!) 144/80   Pulse Readings from Last 3 Encounters:  06/27/20 91  03/15/20 63  02/26/20 62   Wt Readings from Last 3 Encounters:  06/27/20 164 lb 3.2 oz (74.5 kg)  03/15/20 172 lb 9.6 oz (78.3 kg)  02/26/20 171 lb (77.6 kg)   BMI Readings from Last 3 Encounters:  06/27/20 28.18 kg/m  03/15/20 29.63 kg/m  02/26/20 29.35 kg/m    Assessment/Interventions: Review of patient past medical history, allergies, medications, health status, including review of consultants reports, laboratory and other test data, was performed as part of comprehensive evaluation and provision of chronic care management services.   SDOH:  (Social Determinants of Health) assessments and interventions performed: Yes  SDOH Screenings   Alcohol Screen: Not on file  Depression (PHQ2-9): Low Risk   . PHQ-2 Score: 0  Financial Resource Strain: Low Risk   . Difficulty of Paying Living Expenses: Not hard at all  Food Insecurity: No Food Insecurity  . Worried About Charity fundraiser in the Last Year: Never true  . Ran Out of Food in the Last Year: Never true  Housing: Not on file  Physical Activity: Not on file  Social Connections: Not on file  Stress: Not on file  Tobacco Use: Low Risk   . Smoking Tobacco Use: Never Smoker  . Smokeless Tobacco  Use: Never Used  Transportation Needs: No Transportation Needs  . Lack of Transportation (Medical): No  . Lack of Transportation (Non-Medical): No    CCM Care Plan  Allergies  Allergen Reactions  . Codeine Nausea And Vomiting    Dizzy    Medications Reviewed Today    Reviewed by [name], [facility] (Pharmacist) on 09/14/20 at 1220  Med List Status: <None>  Medication Order Taking? Sig Documenting Provider Last Dose Status Informant  acetaminophen (TYLENOL) 500 MG tablet [id number] Yes Take 500 mg by mouth every 6 (six) hours as  needed. [provider] Taking Active   amLODipine (NORVASC) 10 MG tablet [id number] Yes Take 1 tablet by mouth once daily [name], MD Taking Active   aspirin 81 MG EC tablet [id number] Yes Take 81 mg by mouth daily. [provider] Taking Active   Calcium Carbonate-Vit D-Min (CALCIUM 1200 PO) [id number] Yes Take by mouth. [provider] Taking Active   carvedilol (COREG) 6.25 MG tablet [id number] Yes Take 1 tablet (6.25 mg total) by mouth 2 (two) times daily. Wellington Hampshire, MD Taking Active   Cholecalciferol (VITAMIN D-3) 1000 UNITS CAPS [id number] Yes Take 1 capsule by mouth daily. [provider] Taking Active   EUTHYROX 50 MCG tablet [id number] Yes TAKE 1 TABLET BY MOUTH ONCE DAILY BEFORE BREAKFAST [name], MD Taking Active   famotidine (PEPCID) 20 MG tablet [id number] Yes Take 20 mg by mouth 2 (two) times daily. [provider] Taking Active   fluticasone (FLONASE) 50 MCG/ACT nasal spray [id number] Yes Place 2 sprays into both nostrils daily. Marrian Salvage, FNP Taking Active   levocetirizine (XYZAL) 5 MG tablet [id number] No Take 1 tablet (5 mg total) by mouth every evening.  Patient not taking: Reported on 09/14/2020   [name], MD Not Taking Active   losartan (COZAAR) 100 MG tablet [id number] Yes Take 1 tablet by mouth once daily [name], MD Taking Active    metFORMIN (GLUCOPHAGE) 500 MG tablet [id number] Yes Take 1 tablet by mouth once daily with breakfast [name], MD Taking Active   pantoprazole (PROTONIX) 40 MG tablet [id number] Yes Take 1 tablet (40 mg total) by mouth daily. [name], MD Taking Active   pravastatin (PRAVACHOL) 20 MG tablet [id number] Yes Take 1 tablet by mouth once daily [name], MD Taking Active   vitamin B-12 (CYANOCOBALAMIN) 1000 MCG tablet [id number] Yes Take 1,000 mcg by mouth daily. [provider] Taking Active           Patient Active Problem List   Diagnosis Date Noted  . Diabetes mellitus type 2 with complications ([place]) 35/36/1443  . Primary osteoarthritis involving multiple joints 02/17/2019  . Routine health maintenance 05/10/2012  . Hyperlipidemia associated with type 2 diabetes mellitus ([place]) 10/20/2009  . GERD 03/25/2008  . Viral URI 05/12/2007  . Hypothyroidism 02/05/2007  . B12 deficiency 02/05/2007  . VITAMIN D DEFICIENCY 02/05/2007  . Essential hypertension, benign 02/05/2007    Immunization History  Administered Date(s) Administered  . Fluad Quad(high Dose 65+) 12/12/2018  . Influenza Whole 01/31/2009, 02/28/2012  . Influenza, High Dose Seasonal PF 01/06/2018, 12/12/2018  . Influenza,inj,Quad PF,6+ Mos 01/11/2014  . Influenza-Unspecified 01/29/2013, 12/22/2015, 12/08/2016, 01/09/2020  . Moderna Sars-Covid-2 Vaccination 06/17/2019, 07/15/2019  . Pneumococcal Conjugate-13 12/14/2014  . Pneumococcal Polysaccharide-23 05/08/2012  . Tetanus 05/08/2012    Conditions to be addressed/monitored:  Hypertension, Hyperlipidemia, Diabetes, Chronic Kidney Disease and Allergic Rhinitis  Care Plan : CCM Care Plan  Updates made by [name], RPH since 09/14/2020 12:00 AM    Problem: HTN, HLD, DM2, Hypothyroidism, GERD, Allergic Rhinitis   Priority: High  Onset Date: 09/14/2020    Long-Range Goal: Disease Management   Start Date: 09/14/2020  Expected  End Date: 12/15/2020  This Visit's Progress: On track  Priority: High  Note:   Current Barriers:  . Unable to independently monitor therapeutic efficacy  Pharmacist Clinical Goal(s):  Marland Kitchen Patient will verbalize ability to afford treatment regimen . achieve adherence to monitoring guidelines and medication adherence  to achieve therapeutic efficacy . maintain control of Blood pressure and Blood sugars  as evidenced by blood pressure logs and A1c with next labs   through collaboration with PharmD and provider.   Interventions: . 1:1 collaboration with [name], MD regarding development and update of comprehensive plan of care as evidenced by provider attestation and co-signature . Inter-disciplinary care team collaboration (see longitudinal plan of care) . Comprehensive medication review performed; medication list updated in electronic medical record  Hypertension (BP goal <140/90) -Controlled -Current treatment: . Carvedilol 6.10m twice daily  . Losartan 1064mdaily  . Amlodipine 1066maily  -Medications previously tried: atenolol, nebivolol, lisinopril, HCTZ, furosemide  -Current home readings: n/a - patient does not currently have a BP cuff to monitor  -Current dietary habits: does not salt her food, uses only sodium reduced foods, drinks decaf coffee if any at all -Current exercise habits: limited due to pain in her hips/legs -Denies hypotensive/hypertensive symptoms -Educated on BP goals and benefits of medications for prevention of heart attack, stroke and kidney damage; Daily salt intake goal < 2300 mg; Importance of home blood pressure monitoring; -Counseled to monitor BP at home 1-2 times weekly, document, and provide log at future appointments -Counseled on diet and exercise extensively Recommended to continue current medication  Hyperlipidemia: (LDL goal < 70) -Not ideally controlled  - Last LDL 114 mg/dL - (02/26/2020) -Current treatment: . Pravastatin 69m48maily  . ASA 81mg16mly  -Medications previously tried: Tricor, lovaza, lovastatin    -Current dietary patterns: reports that she does eat bacon for breakfast on occasion, but does not eat much fried/ fatty foods aside from that  -Current exercise habits: limited due to pain in hips/legs  -Educated on Cholesterol goals;  Benefits of statin for ASCVD risk reduction; Importance of limiting foods high in cholesterol; -Counseled on diet and exercise extensively Recommended to continue current medication  Diabetes (A1c goal <7%) -Controlled  - Last A1c 6.2% - (02/26/2020) - Last GFR 46.34 mL/min (02/26/2020) -Current medications: . MetMarland Kitchenormin 500mg 53my - reports to diarrhea on occasion  -Medications previously tried: n/a  -Denies hypoglycemic/hyperglycemic symptoms -Current meal patterns:  . breakfast: fruit, toast, eggs, bacon  . lunch: salad / sandwich   . dinner: potatoes, chicken, vegetables  . snacks: peanutbutter and crackers / popcorn . drinks: water, sugar free powerade, diet cheerwine  -Current exercise: limited due to pain/ cramps in her legs  -Educated on A1c and blood sugar goals; Complications of diabetes including kidney damage, retinal damage, and cardiovascular disease; Benefits of weight loss; common side effect of metformin being diarrhea, will plan to switch to metformin XR to prevent bowel issues  -Counseled to check feet daily and get yearly eye exams -Recommended for patient to stop metformin IR and switch to metformin XR due to current GI side effects patient is having   GERD (Goal: acid control / prevention of reflux ) -Controlled -Current treatment  . Pantoprazole 40mg d38m  -Medications previously tried: ranitidine   -Recommended for patient to trial use of famotidine 69mg da12m(OTC) as she asked for an alternative option to daily use of pantoprazole    Hypothyroidism (Goal: Maintenance of euthyroid levels) -Controlled  -Last TSH - 2.5 uIU/mL -  (02/17/2019) -Current treatment  . Euthyrox 50mcg da14m -Medications previously tried: synthroid  -Recommended to continue current medication   Allergic Rhinitis (Goal: allergy control) -Not ideally controlled -Current treatment  . Flonase 50mcg/act18m spray into each nostril daily as needed  -  Medications previously tried: xyzal - patient unsure as to why she stopped this medication   -Recommended for patient to restart Xyzal 4m daily   Health Maintenance -Current therapy:  . Calcium Carbonate-Vit D3 - 1200 units - 1 tablet daily  . Vit D3  - 1000 units daily  . Vit B-12 10068m daily  -Educated on Cost vs benefit of each product must be carefully weighed by individual consumer -Patient is satisfied with current therapy and denies issues -Recommended to continue current medication  Patient Goals/Self-Care Activities . Patient will:  - take medications as prescribed check blood pressure 1-2 time weekly , document, and provide at future appointments  Follow Up Plan: Telephone follow up appointment with care management team member scheduled for: The patient has been provided with contact information for the care management team and has been advised to call with any health related questions or concerns.  Next PCP appointment scheduled for:        Medication Assistance: None required.  Patient affirms current coverage meets needs.  [facility]  [place] [id number]: [phone]: [phone] Uses pill box? No - feels she can manage without  Pt endorses 100% compliance  Care Plan and Follow Up Patient Decision:  Patient agrees to Care Plan and Follow-up.  Plan: Telephone follow up appointment with care management team member scheduled for:  3 months, The patient has been provided with contact information for the care management team and has been  advised to call with any health related questions or concerns.  and Follow up with provider re: 09/19/2020   Medication Changes: Stop: Metformin 50059maily  Start: Metformin XR 500m12mily  Stop: Pantoprazole 40mg19mly  Start: Famotidine 20mg 51my   DanielTomasa BlasemD Clinical Pharmacist, [place]/22

## 2020-09-14 ENCOUNTER — Ambulatory Visit (INDEPENDENT_AMBULATORY_CARE_PROVIDER_SITE_OTHER): Payer: HMO

## 2020-09-14 ENCOUNTER — Other Ambulatory Visit: Payer: Self-pay

## 2020-09-14 DIAGNOSIS — E039 Hypothyroidism, unspecified: Secondary | ICD-10-CM

## 2020-09-14 DIAGNOSIS — I10 Essential (primary) hypertension: Secondary | ICD-10-CM | POA: Diagnosis not present

## 2020-09-14 DIAGNOSIS — E785 Hyperlipidemia, unspecified: Secondary | ICD-10-CM | POA: Diagnosis not present

## 2020-09-14 DIAGNOSIS — K219 Gastro-esophageal reflux disease without esophagitis: Secondary | ICD-10-CM

## 2020-09-14 DIAGNOSIS — E118 Type 2 diabetes mellitus with unspecified complications: Secondary | ICD-10-CM | POA: Diagnosis not present

## 2020-09-14 DIAGNOSIS — E1169 Type 2 diabetes mellitus with other specified complication: Secondary | ICD-10-CM

## 2020-09-14 NOTE — Patient Instructions (Addendum)
Visit Information   PATIENT GOALS:  Goals Addressed            This Visit's Progress   . Track and Manage My Blood Pressure-Hypertension       Timeframe:  Long-Range Goal Priority:  High Start Date:  09/14/2020                           Expected End Date:  03/16/2021                     Follow Up Date 12/15/2020   - check blood pressure weekly - record blood pressures and present with next appointment     Why is this important?    You won't feel high blood pressure, but it can still hurt your blood vessels.   High blood pressure can cause heart or kidney problems. It can also cause a stroke.   Making lifestyle changes like losing a little weight or eating less salt will help.   Checking your blood pressure at home and at different times of the day can help to control blood pressure.   If the doctor prescribes medicine remember to take it the way the doctor ordered.   Call the office if you cannot afford the medicine or if there are questions about it.     Notes: Patient will purchase blood pressure cuff at pharmacy to be able to start recording blood pressure        Consent to CCM Services: Kayla Craig was given information about Chronic Care Management services today including:  1. CCM service includes personalized support from designated clinical staff supervised by her physician, including individualized plan of care and coordination with other care providers 2. 24/7 contact phone numbers for assistance for urgent and routine care needs. 3. Service will only be billed when office clinical staff spend 20 minutes or more in a month to coordinate care. 4. Only one practitioner may furnish and bill the service in a calendar month. 5. The patient may stop CCM services at any time (effective at the end of the month) by phone call to the office staff. 6. The patient will be responsible for cost sharing (co-pay) of up to 20% of the service fee (after annual deductible is  met).  Patient agreed to services and verbal consent obtained.   The patient verbalized understanding of instructions, educational materials, and care plan provided today and declined offer to receive copy of patient instructions, educational materials, and care plan.   Telephone follow up appointment with care management team member scheduled for: 3 months  The patient has been provided with contact information for the care management team and has been advised to call with any health related questions or concerns.   Kayla Craig, PharmD Clinical Pharmacist, [name]   CLINICAL CARE PLAN: Patient Care Plan: CCM Care Plan    Problem Identified: HTN, HLD, DM2, Hypothyroidism, GERD, Allergic Rhinitis   Priority: High  Onset Date: 09/14/2020    Long-Range Goal: Disease Management   Start Date: 09/14/2020  Expected End Date: 12/15/2020  This Visit's Progress: On track  Priority: High  Note:   Current Barriers:  . Unable to independently monitor therapeutic efficacy  Pharmacist Clinical Goal(s):  Marland Kitchen Patient will verbalize ability to afford treatment regimen . achieve adherence to monitoring guidelines and medication adherence to achieve therapeutic efficacy . maintain control of Blood pressure and Blood sugars  as evidenced  by blood pressure logs and A1c with next labs   through collaboration with PharmD and provider.   Interventions: . 1:1 collaboration with [name], MD regarding development and update of comprehensive plan of care as evidenced by provider attestation and co-signature . Inter-disciplinary care team collaboration (see longitudinal plan of care) . Comprehensive medication review performed; medication list updated in electronic medical record  Hypertension (BP goal <140/90) -Controlled -Current treatment: . Carvedilol 6.60m twice daily  . Losartan 1016mdaily  . Amlodipine 1075maily  -Medications previously tried: atenolol, nebivolol,  lisinopril, HCTZ, furosemide  -Current home readings: n/a - patient does not currently have a BP cuff to monitor  -Current dietary habits: does not salt her food, uses only sodium reduced foods, drinks decaf coffee if any at all -Current exercise habits: limited due to pain in her hips/legs -Denies hypotensive/hypertensive symptoms -Educated on BP goals and benefits of medications for prevention of heart attack, stroke and kidney damage; Daily salt intake goal < 2300 mg; Importance of home blood pressure monitoring; -Counseled to monitor BP at home 1-2 times weekly, document, and provide log at future appointments -Counseled on diet and exercise extensively Recommended to continue current medication  Hyperlipidemia: (LDL goal < 70) -Not ideally controlled  - Last LDL 114 mg/dL - (02/26/2020) -Current treatment: . Pravastatin 75m17mily  . ASA 81mg56mly  -Medications previously tried: Tricor, lovaza, lovastatin    -Current dietary patterns: reports that she does eat bacon for breakfast on occasion, but does not eat much fried/ fatty foods aside from that  -Current exercise habits: limited due to pain in hips/legs  -Educated on Cholesterol goals;  Benefits of statin for ASCVD risk reduction; Importance of limiting foods high in cholesterol; -Counseled on diet and exercise extensively Recommended to continue current medication  Diabetes (A1c goal <7%) -Controlled  - Last A1c 6.2% - (02/26/2020) - Last GFR 46.34 mL/min (02/26/2020) -Current medications: . MetMarland Kitchenormin 500mg 80my - reports to diarrhea on occasion  -Medications previously tried: n/a  -Denies hypoglycemic/hyperglycemic symptoms -Current meal patterns:  . breakfast: fruit, toast, eggs, bacon  . lunch: salad / sandwich   . dinner: potatoes, chicken, vegetables  . snacks: peanutbutter and crackers / popcorn . drinks: water, sugar free powerade, diet cheerwine  -Current exercise: limited due to pain/ cramps in her legs   -Educated on A1c and blood sugar goals; Complications of diabetes including kidney damage, retinal damage, and cardiovascular disease; Benefits of weight loss; common side effect of metformin being diarrhea, will plan to switch to metformin XR to prevent bowel issues  -Counseled to check feet daily and get yearly eye exams -Recommended for patient to stop metformin IR and switch to metformin XR due to current GI side effects patient is having   GERD (Goal: acid control / prevention of reflux ) -Controlled -Current treatment  . Pantoprazole 40mg d37m  -Medications previously tried: ranitidine   -Recommended for patient to trial use of famotidine 75mg da69m(OTC) as she asked for an alternative option to daily use of pantoprazole    Hypothyroidism (Goal: Maintenance of euthyroid levels) -Controlled  -Last TSH - 2.5 uIU/mL - (02/17/2019) -Current treatment  . Euthyrox 50mcg da66m -Medications previously tried: synthroid  -Recommended to continue current medication   Allergic Rhinitis (Goal: allergy control) -Not ideally controlled -Current treatment  . Flonase 50mcg/act13m spray into each nostril daily as needed  -Medications previously tried: xyzal - patient unsure as to why she stopped this medication   -  Recommended for patient to restart Xyzal 37m daily   Health Maintenance -Current therapy:  . Calcium Carbonate-Vit D3 - 1200 units - 1 tablet daily  . Vit D3  - 1000 units daily  . Vit B-12 10050m daily  -Educated on Cost vs benefit of each product must be carefully weighed by individual consumer -Patient is satisfied with current therapy and denies issues -Recommended to continue current medication  Patient Goals/Self-Care Activities . Patient will:  - take medications as prescribed check blood pressure 1-2 time weekly , document, and provide at future appointments  Follow Up Plan: Telephone follow up appointment with care management team member scheduled for: The  patient has been provided with contact information for the care management team and has been advised to call with any health related questions or concerns.  Next PCP appointment scheduled for:       How to Take Your Blood Pressure Blood pressure measures how strongly your blood is pressing against the walls of your arteries. Arteries are blood vessels that carry blood from your heart throughout your body. You can take your blood pressure at home with a machine. You may need to check your blood pressure at home:  To check if you have high blood pressure (hypertension).  To check your blood pressure over time.  To make sure your blood pressure medicine is working. Supplies needed:  Blood pressure machine, or monitor.  Dining room chair to sit in.  Table or desk.  Small notebook.  Pencil or pen. How to prepare Avoid these things for 30 minutes before checking your blood pressure:  Having drinks with caffeine in them, such as coffee or tea.  Drinking alcohol.  Eating.  Smoking.  Exercising. Do these things five minutes before checking your blood pressure:  Go to the bathroom and pee (urinate).  Sit in a dining chair. Do not sit in a soft couch or an armchair.  Be quiet. Do not talk. How to take your blood pressure Follow the instructions that came with your machine. If you have a digital blood pressure monitor, these may be the instructions: 1. Sit up straight. 2. Place your feet on the floor. Do not cross your ankles or legs. 3. Rest your left arm at the level of your heart. You may rest it on a table, desk, or chair. 4. Pull up your shirt sleeve. 5. Wrap the blood pressure cuff around the upper part of your left arm. The cuff should be 1 inch (2.5 cm) above your elbow. It is best to wrap the cuff around bare skin. 6. Fit the cuff snugly around your arm. You should be able to place only one finger between the cuff and your arm. 7. Place the cord so that it rests in  the bend of your elbow. 8. Press the power button. 9. Sit quietly while the cuff fills with air and loses air. 10. Write down the numbers on the screen. 11. Wait 2-3 minutes and then repeat steps 1-10.   What do the numbers mean? Two numbers make up your blood pressure. The first number is called systolic pressure. The second is called diastolic pressure. An example of a blood pressure reading is "120 over 80" (or 120/80). If you are an adult and do not have a medical condition, use this guide to find out if your blood pressure is normal: Normal  First number: below 120.  Second number: below 80. Elevated  First number: 120-129.  Second number: below 80. Hypertension  stage 1  First number: 130-139.  Second number: 80-89. Hypertension stage 2  First number: 140 or above.  Second number: 82 or above. Your blood pressure is above normal even if only the top or bottom number is above normal. Follow these instructions at home:  Check your blood pressure as often as your doctor tells you to.  Check your blood pressure at the same time every day.  Take your monitor to your next doctor's appointment. Your doctor will: ? Make sure you are using it correctly. ? Make sure it is working right.  Make sure you understand what your blood pressure numbers should be.  Tell your doctor if your medicine is causing side effects.  Keep all follow-up visits as told by your doctor. This is important. General tips:  You will need a blood pressure machine, or monitor. Your doctor can suggest a monitor. You can buy one at a drugstore or online. When choosing one: ? Choose one with an arm cuff. ? Choose one that wraps around your upper arm. Only one finger should fit between your arm and the cuff. ? Do not choose one that measures your blood pressure from your wrist or finger. Where to find more information American Heart Association: www.heart.org Contact a doctor if:  Your blood pressure  keeps being high. Get help right away if:  Your first blood pressure number is higher than 180.  Your second blood pressure number is higher than 120. Summary  Check your blood pressure at the same time every day.  Avoid caffeine, alcohol, smoking, and exercise for 30 minutes before checking your blood pressure.  Make sure you understand what your blood pressure numbers should be. This information is not intended to replace advice given to you by your health care provider. Make sure you discuss any questions you have with your health care provider. Document Revised: 03/20/2019 Document Reviewed: 03/20/2019 Elsevier Patient Education  2021 [employer].

## 2020-09-19 ENCOUNTER — Telehealth (INDEPENDENT_AMBULATORY_CARE_PROVIDER_SITE_OTHER): Payer: HMO | Admitting: Internal Medicine

## 2020-09-19 ENCOUNTER — Encounter: Payer: Self-pay | Admitting: Internal Medicine

## 2020-09-19 DIAGNOSIS — M546 Pain in thoracic spine: Secondary | ICD-10-CM

## 2020-09-19 DIAGNOSIS — E118 Type 2 diabetes mellitus with unspecified complications: Secondary | ICD-10-CM

## 2020-09-19 MED ORDER — BACLOFEN 5 MG PO TABS: 5.0000 mg | ORAL_TABLET | Freq: Two times a day (BID) | ORAL | 0 refills | Status: DC | PRN

## 2020-09-19 MED ORDER — METFORMIN HCL ER 500 MG PO TB24: 500.0000 mg | ORAL_TABLET | Freq: Every day | ORAL | 3 refills | Status: DC

## 2020-09-19 NOTE — Progress Notes (Signed)
Virtual Visit via Audio Note  I connected with Kayla Craig on 09/19/20 at  1:40 PM EDT by an audio-only enabled telemedicine application and verified that I am speaking with the correct person using two identifiers.  The patient and the provider were at separate locations throughout the entire encounter. Patient location: home, Provider location: work   I discussed the limitations of evaluation and management by telemedicine and the availability of in person appointments. The patient expressed understanding and agreed to proceed. The patient and the provider were the only parties present for the visit unless noted in HPI below.  History of Present Illness: The patient is a 85 y.o. female with visit for headache and back pain and cough. Took tylenol and this did not help much. Cough since March and this is not changed. Has pain in the middle of the back. Has headache also today. No injury or overuse. No radiation of the pain.   Observations/Objective: A and O times 3, no cough during visit  Assessment and Plan: See problem oriented charting  Follow Up Instructions: rx baclofen for back pain, if no improvement 1-2 weeks x-ray can be ordered  Visit time 12 minutes in non-face to face communication with patient and coordination of care. I discussed the assessment and treatment plan with the patient. The patient was provided an opportunity to ask questions and all were answered. The patient agreed with the plan and demonstrated an understanding of the instructions.   The patient was advised to call back or seek an in-person evaluation if the symptoms worsen or if the condition fails to improve as anticipated.  Myrlene Broker, MD

## 2020-09-19 NOTE — Assessment & Plan Note (Signed)
Per pharmacist recommendation change metformin to xr form.

## 2020-09-19 NOTE — Assessment & Plan Note (Signed)
Rx baclofen 5 mg BID prn for pain. Can take tylenol also. Call back if not improving.

## 2020-10-12 ENCOUNTER — Other Ambulatory Visit: Payer: Self-pay | Admitting: Internal Medicine

## 2020-10-25 ENCOUNTER — Encounter: Payer: Self-pay | Admitting: Cardiovascular Disease

## 2020-10-25 ENCOUNTER — Ambulatory Visit: Payer: HMO | Admitting: Cardiovascular Disease

## 2020-10-25 ENCOUNTER — Other Ambulatory Visit: Payer: Self-pay

## 2020-10-25 VITALS — BP 124/72 | HR 73 | Ht 64.0 in | Wt 166.0 lb

## 2020-10-25 DIAGNOSIS — R0609 Other forms of dyspnea: Secondary | ICD-10-CM

## 2020-10-25 DIAGNOSIS — I739 Peripheral vascular disease, unspecified: Secondary | ICD-10-CM | POA: Diagnosis not present

## 2020-10-25 DIAGNOSIS — E785 Hyperlipidemia, unspecified: Secondary | ICD-10-CM

## 2020-10-25 DIAGNOSIS — I10 Essential (primary) hypertension: Secondary | ICD-10-CM

## 2020-10-25 DIAGNOSIS — R9431 Abnormal electrocardiogram [ECG] [EKG]: Secondary | ICD-10-CM

## 2020-10-25 DIAGNOSIS — R0600 Dyspnea, unspecified: Secondary | ICD-10-CM

## 2020-10-25 NOTE — Progress Notes (Signed)
Cardiology Office Note   Date:  10/25/2020   ID:  [name], DOB 09-19-1935, MRN [id number]  PCP:  Myrlene Broker, MD  Cardiologist:   Lorine Bears, MD   No chief complaint on file.     History of Present Illness: Kayla Craig is a 85 y.o. female who is here today for follow-up visit regarding peripheral arterial disease. She has no prior cardiac history.  She has multiple chronic medical conditions including essential hypertension, type 2 diabetes, hyperlipidemia, GERD and osteoarthritis.  She suffers from hammertoes on both sides but with significant discomfort affecting the right second toe with some discoloration.  She was initially planning to have surgery done and was referred for vascular evaluation before that. She underwent noninvasive vascular evaluation which showed an ABI of 0.78 on the right and 0.65 on the left. Duplex showed severe stenosis in the right popliteal artery with occluded posterior tibial artery.  On the left, there was also severe stenosis in the popliteal artery extending into the TP trunk with occluded posterior tibial artery.  She continues to deny significant claudication.  She continues to have knee pain related to arthritis.  She denies chest pain but does report exertional dyspnea.  In addition, she has been dealing with dry cough and itching in her throat since March.  She has some mild sinus drainage.  She does have known history of GERD but her symptoms are controlled.  Past Medical History:  Diagnosis Date   B12 deficiency    Back pain    Chronic   Goiter    Grief    Grief and loss related depression '08   Hypertension    TMJ (dislocation of temporomandibular joint)    Right Side   Vitamin D deficiency     Past Surgical History:  Procedure Laterality Date   EYE SURGERY  2010   cataract surgery with implants   KNEE SURGERY     Right Arthroscopy '05 Dr Charlann Boxer   THYROIDECTOMY     August '09 for goiter (Dr Jearld Fenton)   TUBAL  LIGATION       Current Outpatient Medications  Medication Sig Dispense Refill   acetaminophen (TYLENOL) 500 MG tablet Take 500 mg by mouth every 6 (six) hours as needed.     amLODipine (NORVASC) 10 MG tablet Take 1 tablet by mouth once daily 90 tablet 0   aspirin 81 MG EC tablet Take 81 mg by mouth daily.     Baclofen 5 MG TABS Take 5 mg by mouth 2 (two) times daily as needed (back pain). 60 tablet 0   Calcium Carbonate-Vit D-Min (CALCIUM 1200 PO) Take by mouth.     carvedilol (COREG) 6.25 MG tablet Take 1 tablet (6.25 mg total) by mouth 2 (two) times daily. 180 tablet 1   Cholecalciferol (VITAMIN D-3) 1000 UNITS CAPS Take 1 capsule by mouth daily.     EUTHYROX 50 MCG tablet TAKE 1 TABLET BY MOUTH ONCE DAILY BEFORE BREAKFAST 90 tablet 0   famotidine (PEPCID) 20 MG tablet Take 20 mg by mouth 2 (two) times daily.     fluticasone (FLONASE) 50 MCG/ACT nasal spray Place 2 sprays into both nostrils daily. 16 g 0   levocetirizine (XYZAL) 5 MG tablet Take 1 tablet (5 mg total) by mouth every evening. 30 tablet 0   losartan (COZAAR) 100 MG tablet Take 1 tablet by mouth once daily 90 tablet 0   metFORMIN (GLUCOPHAGE XR) 500 MG 24 hr tablet  Take 1 tablet (500 mg total) by mouth daily with breakfast. 90 tablet 3   pantoprazole (PROTONIX) 40 MG tablet Take 1 tablet (40 mg total) by mouth daily. 90 tablet 3   pravastatin (PRAVACHOL) 20 MG tablet Take 1 tablet by mouth once daily 90 tablet 0   vitamin B-12 (CYANOCOBALAMIN) 1000 MCG tablet Take 1,000 mcg by mouth daily.     No current facility-administered medications for this visit.    Allergies:   Codeine    Social History:  The patient  reports that she has never smoked. She has never used smokeless tobacco. She reports that she does not drink alcohol and does not use drugs.   Family History:  The patient's family history includes Cancer in her brother and mother; Heart disease in her brother, brother, and father.    ROS:  Please see the history  of present illness.   Otherwise, review of systems are positive for none.   All other systems are reviewed and negative.    PHYSICAL EXAM: VS:  BP 124/72   Pulse 73   Ht 5\' 4"  (1.626 m)   Wt 166 lb (75.3 kg)   SpO2 98%   BMI 28.49 kg/m  , BMI Body mass index is 28.49 kg/m. GEN: Well nourished, well developed, in no acute distress  HEENT: normal  Neck: no JVD, carotid bruits, or masses Cardiac: RRR; no murmurs, rubs, or gallops,no edema  Respiratory:  clear to auscultation bilaterally, normal work of breathing GI: soft, nontender, nondistended, + BS MS: no deformity or atrophy  Skin: warm and dry, no rash Neuro:  Strength and sensation are intact Psych: euthymic mood, full affect Vascular: Radial pulses normal bilaterally.  Posterior tibial is not palpable.  Dorsalis pedis is +1 bilaterally.   EKG:  EKG is ordered today. EKG showed sinus rhythm with first-degree AV block, LVH, inferior T wave changes suggestive of ischemia.  T wave inversions are new compared to last year.   Recent Labs: 02/26/2020: ALT 15; BUN 22; Creatinine, Ser 1.11; Hemoglobin 13.3; Platelets 272.0; Potassium 4.5; Sodium 138    Lipid Panel    Component Value Date/Time   CHOL 217 (H) 02/26/2020 1417   TRIG (H) 02/26/2020 1417    410.0 Triglyceride is over 400; calculations on Lipids are invalid.   HDL 48.10 02/26/2020 1417   CHOLHDL 5 02/26/2020 1417   VLDL 78.8 (H) 05/13/2018 1039   LDLDIRECT 114.0 02/26/2020 1417      Wt Readings from Last 3 Encounters:  10/25/20 166 lb (75.3 kg)  06/27/20 164 lb 3.2 oz (74.5 kg)  03/15/20 172 lb 9.6 oz (78.3 kg)      No flowsheet data found.    ASSESSMENT AND PLAN:  1.  Peripheral arterial disease: The patient has evidence of severe popliteal artery stenosis bilaterally slightly worse on the left side given the extension into the TP trunk.  In spite of this, she has no significant claudication and most of her symptoms are related to knee arthritis.   Continue medical therapy for now.    2.  Essential hypertension: Blood pressure improved significantly after switching atenolol to carvedilol.  3.  Hyperlipidemia: Currently on pravastatin 20 mg daily.  I reviewed most recent lipid profile done in November which showed an LDL of 114, we should consider a more potent statin to achieve an LDL below 70.  4.  Dry cough: Persistent since March with unclear etiology.  I requested a chest x-ray.  She is not on  an ACE inhibitor.  She has mild postnasal drip but does use Flonase.  GERD symptoms seem to be controlled.  If symptoms persist, consider referral to pulmonary.  5.  Exertional dyspnea with abnormal EKG: Her EKG is suggestive of inferior wall ischemia.  I requested a Lexiscan Myoview.  She is not able to exercise on a treadmill.     Disposition:   FU with me in 6 months  Signed,  Lorine Bears, MD  10/25/2020 1:32 PM    [facility]

## 2020-10-25 NOTE — Patient Instructions (Signed)
Medication Instructions:  No changes *If you need a refill on your cardiac medications before your next appointment, please call your pharmacy*   Lab Work: None ordered If you have labs (blood work) drawn today and your tests are completely normal, you will receive your results only by: MyChart Message (if you have MyChart) OR A paper copy in the mail If you have any lab test that is abnormal or we need to change your treatment, we will call you to review the results.   Testing/Procedures: Your physician has requested that you have a lexiscan myoview. For further information please visit [email or web address] Please follow instruction sheet, as given. This will take place at [address] [facility] [address] [address], [address]  How to prepare for your Myocardial Perfusion Test: Do not eat or drink 3 hours prior to your test, except you may have water. Do not consume products containing caffeine (regular or decaffeinated) 12 hours prior to your test. (ex: coffee, chocolate, sodas, tea). Do bring a list of your current medications with you.  If not listed below, you may take your medications as normal. Do wear comfortable clothes (no dresses or overalls) and walking shoes, tennis shoes preferred (No heels or open toe shoes are allowed). Do NOT wear cologne, perfume, aftershave, or lotions (deodorant is allowed). The test will take approximately 3 to 4 hours to complete If these instructions are not followed, your test will have to be rescheduled.  A chest x-ray takes a picture of the organs and structures inside the chest, including the heart, lungs, and blood vessels. This test can show several things, including, whether the heart is enlarges; whether fluid is building up in the lungs; and whether pacemaker / defibrillator leads are still in place.  This can be done at [facility] at [address]. You do not need an appointment  Follow-Up: At [facility], you and your health needs are our priority.   As part of our continuing mission to provide you with exceptional heart care, we have created designated Provider Care Teams.  These Care Teams include your primary Cardiologist (physician) and Advanced Practice Providers (APPs -  Physician Assistants and Nurse Practitioners) who all work together to provide you with the care you need, when you need it.  We recommend signing up for the patient portal called "MyChart".  Sign up information is provided on this After Visit Summary.  MyChart is used to connect with patients for Virtual Visits (Telemedicine).  Patients are able to view lab/test results, encounter notes, upcoming appointments, etc.  Non-urgent messages can be sent to your provider as well.   To learn more about what you can do with MyChart, go to [email or web address].    Your next appointment:   6 month(s)  The format for your next appointment:   In Person  Provider:   Lorine Bears, MD   Other Instructions You may call [phone] the [facility]. They will be able to set you up with a PCP

## 2020-10-26 ENCOUNTER — Telehealth: Payer: Self-pay | Admitting: Cardiovascular Disease

## 2020-10-26 NOTE — Telephone Encounter (Signed)
Patient wanted to know if she should have her chest x-ray done before she has her appt 11/02/20. Please advise

## 2020-10-26 NOTE — Telephone Encounter (Signed)
The patient has been made aware to have the chest x-ray prior to the [facility].

## 2020-10-27 ENCOUNTER — Telehealth: Payer: Self-pay | Admitting: Cardiovascular Disease

## 2020-10-27 ENCOUNTER — Ambulatory Visit
Admission: RE | Admit: 2020-10-27 | Discharge: 2020-10-27 | Disposition: A | Discharge status: Home / Self Care | Payer: HMO | Source: Ambulatory Visit | Attending: Cardiovascular Disease | Admitting: Cardiovascular Disease

## 2020-10-27 DIAGNOSIS — R9389 Abnormal findings on diagnostic imaging of other specified body structures: Secondary | ICD-10-CM

## 2020-10-27 DIAGNOSIS — R059 Cough, unspecified: Secondary | ICD-10-CM | POA: Diagnosis not present

## 2020-10-27 DIAGNOSIS — R0600 Dyspnea, unspecified: Secondary | ICD-10-CM | POA: Diagnosis not present

## 2020-10-27 NOTE — Telephone Encounter (Signed)
Kayla Craig with [facility] is calling to report abnormal chest xray results.

## 2020-10-27 NOTE — Telephone Encounter (Signed)
Urgent radiology results per [facility].    Faustino Congress not available .  T   [facility] would like to call [facility] to speak with triage.  Also given Dr. Kirke Corin Pager number.

## 2020-10-27 NOTE — Telephone Encounter (Signed)
Spoke with radiology, they are calling to report abnormal chest x-ray. Report shown to dr Bjorn Pippin (DOD), will make dr Kirke Corin aware of report.

## 2020-10-28 ENCOUNTER — Other Ambulatory Visit: Payer: Self-pay | Admitting: *Deleted

## 2020-10-28 ENCOUNTER — Telehealth (HOSPITAL_COMMUNITY): Payer: Self-pay | Admitting: *Deleted

## 2020-10-28 DIAGNOSIS — R0600 Dyspnea, unspecified: Secondary | ICD-10-CM

## 2020-10-28 DIAGNOSIS — R0609 Other forms of dyspnea: Secondary | ICD-10-CM

## 2020-10-28 NOTE — Telephone Encounter (Signed)
Very abnormal chest x-ray suggestive of pulmonary fibrosis or atypical infection.  Recommend urgent referral to pulmonary for evaluation.

## 2020-10-28 NOTE — Telephone Encounter (Signed)
Close encounter 

## 2020-10-28 NOTE — Telephone Encounter (Signed)
Patient made aware of results and verbalized understanding.  Urgent referral has been placed.

## 2020-10-28 NOTE — Progress Notes (Signed)
Attestation for the [employer]

## 2020-10-28 NOTE — Telephone Encounter (Signed)
Patient has an appointment on 12/06/20 with pulmonary

## 2020-10-31 DIAGNOSIS — R9389 Abnormal findings on diagnostic imaging of other specified body structures: Secondary | ICD-10-CM | POA: Insufficient documentation

## 2020-10-31 NOTE — Telephone Encounter (Signed)
Please make patient apt in our office since pulmonary cannot see until late August.

## 2020-10-31 NOTE — Progress Notes (Signed)
Subjective:    Patient ID: Kayla Craig, female    DOB: 1935-09-26, 85 y.o.   MRN: [id number]  HPI The patient is here for an acute visit for abn cxr.  She saw cardio 7/19.  She complained of a dry cough, itchy throat since march. Cxr done and is here for follow up.  She sees pulmonary 12/06/20.     Chest x-ray shows? Pulm fibrosis, atypical infection  She was seen 06/2020 for URI.  It sounded viral and did not have treatment.  She did not follow-up, but has had a persistent cough since then.  She states she coughs up a good amount of clear mucus first thing in the morning and in the evening.  The remainder of the day she is okay.  She denies any shortness of breath except for when she exerts herself in a moderate nature, which is not new.  She denies any chest tightness, wheeze.  She denies any other cold symptoms.      DG Chest 2 View CLINICAL DATA:  Cough and dyspnea  EXAM: CHEST - 2 VIEW  COMPARISON:  11/07/2007  FINDINGS: Interval development of diffuse bilateral airspace densities. This could be acute or chronic. No effusion or mass lesion.  Thoracic kyphosis and scoliosis and disc degeneration. No acute skeletal abnormality. Atherosclerotic calcification aorta.  IMPRESSION: Interval development of ill-defined bilateral airspace disease which could be acute or chronic. Possible pulmonary fibrosis. Acute pneumonia possible. Correlate with symptoms. Consider CT chest for further evaluation.  Electronically Signed   By: Marlan Palau M.D.   On: 10/27/2020 12:24   Medications and allergies reviewed with patient and updated if appropriate.  Patient Active Problem List   Diagnosis Date Noted   Abnormal chest x-ray 10/31/2020   Diabetes mellitus type 2 with complications (HCC) 02/26/2020   Primary osteoarthritis involving multiple joints 02/17/2019   Routine health maintenance 05/10/2012   Hyperlipidemia associated with type 2 diabetes mellitus (HCC) 10/20/2009    GERD 03/25/2008   Back pain 10/03/2007   Viral URI 05/12/2007   Hypothyroidism 02/05/2007   B12 deficiency 02/05/2007   VITAMIN D DEFICIENCY 02/05/2007   Essential hypertension, benign 02/05/2007    Current Outpatient Medications on File Prior to Visit  Medication Sig Dispense Refill   acetaminophen (TYLENOL) 500 MG tablet Take 500 mg by mouth every 6 (six) hours as needed.     amLODipine (NORVASC) 10 MG tablet Take 1 tablet by mouth once daily 90 tablet 0   aspirin 81 MG EC tablet Take 81 mg by mouth daily.     Baclofen 5 MG TABS Take 5 mg by mouth 2 (two) times daily as needed (back pain). 60 tablet 0   Calcium Carbonate-Vit D-Min (CALCIUM 1200 PO) Take by mouth.     Cholecalciferol (VITAMIN D-3) 1000 UNITS CAPS Take 1 capsule by mouth daily.     erythromycin ophthalmic ointment 1 application at bedtime.     EUTHYROX 50 MCG tablet TAKE 1 TABLET BY MOUTH ONCE DAILY BEFORE BREAKFAST 90 tablet 0   famotidine (PEPCID) 20 MG tablet Take 20 mg by mouth 2 (two) times daily.     fluticasone (FLONASE) 50 MCG/ACT nasal spray Place 2 sprays into both nostrils daily. 16 g 0   losartan (COZAAR) 100 MG tablet Take 1 tablet by mouth once daily 90 tablet 0   metFORMIN (GLUCOPHAGE XR) 500 MG 24 hr tablet Take 1 tablet (500 mg total) by mouth daily with breakfast. 90 tablet 3  pantoprazole (PROTONIX) 40 MG tablet Take 1 tablet (40 mg total) by mouth daily. 90 tablet 3   pravastatin (PRAVACHOL) 20 MG tablet Take 1 tablet by mouth once daily 90 tablet 0   vitamin B-12 (CYANOCOBALAMIN) 1000 MCG tablet Take 1,000 mcg by mouth daily.     No current facility-administered medications on file prior to visit.    Past Medical History:  Diagnosis Date   B12 deficiency    Back pain    Chronic   Goiter    Grief    Grief and loss related depression '08   Hypertension    TMJ (dislocation of temporomandibular joint)    Right Side   Vitamin D deficiency     Past Surgical History:  Procedure  Laterality Date   EYE SURGERY  08/11/2008   cataract surgery with implants   KNEE SURGERY     Right Arthroscopy '05 Dr Charlann Boxer   THYROIDECTOMY     August '09 for goiter (Dr Jearld Fenton)   TUBAL LIGATION      Social History   Socioeconomic History   Marital status: Single    Spouse name: Not on file   Number of children: 2   Years of education: 12   Highest education level: Not on file  Occupational History   Occupation: retired    Associate Professor: RETIRED  Tobacco Use   Smoking status: Never   Smokeless tobacco: Never  Vaping Use   Vaping Use: Never used  Substance and Sexual Activity   Alcohol use: No    Alcohol/week: 0.0 standard drinks   Drug use: Never   Sexual activity: Never  Other Topics Concern   Not on file  Social History Narrative   HSG. [place]. 2 sons- 2060/08/11 (died lung cancer 08/03/22), 08-12-2067; 3 grandchildren. 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 End of life issues: patient would want CPR, DNI, not to be maintained in a vegative state. Laymen's guide packet provided (july '11)   Social Determinants of Health   Financial Resource Strain: Low Risk    Difficulty of Paying Living Expenses: Not hard at all  Food Insecurity: No Food Insecurity   Worried About Programme researcher, broadcasting/film/video in the Last Year: Never true   Barista in the Last Year: Never true  Transportation Needs: No Transportation Needs   Lack of Transportation (Medical): No   Lack of Transportation (Non-Medical): No  Physical Activity: Not on file  Stress: Not on file  Social Connections: Not on file    Family History  Problem Relation Age of Onset   Cancer Mother    Heart disease Father        CAD/MI   Heart disease Brother        CAD/MI   Heart disease Brother        CAD/MI   Cancer Brother    Diabetes Neg Hx     Review of Systems  Constitutional:  Negative for appetite change, fatigue and fever.  HENT:   Negative for congestion, ear pain, sinus pain and sore throat.   Respiratory:  Positive for cough (productive of clear sputum in early morning and evening) and shortness of breath (occ with moderate exertion). Negative for chest tightness and wheezing.   Cardiovascular:  Negative for chest pain, palpitations and leg swelling.  Neurological:  Positive for headaches (right sided HA x 5 days three weeks ago - otherwise seldom). Negative for  light-headedness.      Objective:   Vitals:   11/01/20 1106  BP: 140/80  Pulse: 72  Temp: 98.2 F (36.8 C)  SpO2: 97%   BP Readings from Last 3 Encounters:  11/01/20 140/80  10/25/20 124/72  06/27/20 128/74   Wt Readings from Last 3 Encounters:  11/01/20 161 lb 3.2 oz (73.1 kg)  10/25/20 166 lb (75.3 kg)  06/27/20 164 lb 3.2 oz (74.5 kg)   Body mass index is 27.67 kg/m.   Physical Exam    Constitutional: Appears well-developed and well-nourished. No distress.  Head: Normocephalic and atraumatic.  Neck: Neck supple. No tracheal deviation present. No thyromegaly present.  No cervical lymphadenopathy Cardiovascular: Normal rate, regular rhythm and normal heart sounds.  No murmur heard. No carotid bruit .  No edema Pulmonary/Chest: Effort normal. No respiratory distress. No has no wheezes.  Bibasilar dry crackles Skin: Skin is warm and dry. Not diaphoretic.  Psychiatric: Normal mood and affect. Behavior is normal.       Assessment & Plan:    See Problem List for Assessment and Plan of chronic medical problems.    This visit occurred during the SARS-CoV-2 public health emergency.  Safety protocols were in place, including screening questions prior to the visit, additional usage of staff PPE, and extensive cleaning of exam room while observing appropriate contact time as indicated for disinfecting solutions.

## 2020-10-31 NOTE — Telephone Encounter (Signed)
Patient is scheduled for 10:40AM with Dr.Burns 7.26.22

## 2020-11-01 ENCOUNTER — Encounter: Payer: Self-pay | Admitting: Internal Medicine

## 2020-11-01 ENCOUNTER — Ambulatory Visit (INDEPENDENT_AMBULATORY_CARE_PROVIDER_SITE_OTHER): Payer: HMO | Admitting: Internal Medicine

## 2020-11-01 ENCOUNTER — Other Ambulatory Visit: Payer: Self-pay

## 2020-11-01 ENCOUNTER — Other Ambulatory Visit: Payer: Self-pay | Admitting: Cardiovascular Disease

## 2020-11-01 VITALS — BP 140/80 | HR 72 | Temp 98.2°F | Ht 64.0 in | Wt 161.2 lb

## 2020-11-01 DIAGNOSIS — R9389 Abnormal findings on diagnostic imaging of other specified body structures: Secondary | ICD-10-CM

## 2020-11-01 DIAGNOSIS — R059 Cough, unspecified: Secondary | ICD-10-CM | POA: Diagnosis not present

## 2020-11-01 DIAGNOSIS — R053 Chronic cough: Secondary | ICD-10-CM | POA: Insufficient documentation

## 2020-11-01 MED ORDER — AZITHROMYCIN 250 MG PO TABS: ORAL_TABLET | ORAL | 0 refills | Status: DC

## 2020-11-01 NOTE — Assessment & Plan Note (Signed)
Acute Recent chest x-ray done by cardiology for cough shows possible pulmonary fibrosis or atypical pneumonia She has had a cough that is productive of clear sputum in the morning and evening since March ?  Atypical pneumonia-we will try a Z-Pak to see if that helps She does have some bibasilar crackles and still could have some pulmonary fibrosis Will obtain a CT of the chest the end of August just before she sees pulmonary and they can assess further

## 2020-11-01 NOTE — Assessment & Plan Note (Signed)
Chronic She has had a persistent cough since March/2022 Productive of clear sputum in the morning and evening No other cold symptoms No associated shortness of breath, wheeze or chest tightness ?  Atypical pneumonia given chest x-ray results Z-Pak prescribed CT scan ordered for about a month from now Will see pulmonary the end of next month

## 2020-11-01 NOTE — Telephone Encounter (Signed)
NL Patient.  Please review for refill Thanks !

## 2020-11-01 NOTE — Patient Instructions (Signed)
   Medications changes include :  zpak    Your prescription(s) have been submitted to your pharmacy. Please take as directed and contact our office if you believe you are having problem(s) with the medication(s).   A Ct scan of your lungs was ordered.       Someone will call you to schedule an appointment.

## 2020-11-02 ENCOUNTER — Ambulatory Visit (HOSPITAL_COMMUNITY)
Admission: RE | Admit: 2020-11-02 | Discharge: 2020-11-02 | Disposition: A | Discharge status: Home / Self Care | Payer: HMO | Source: Ambulatory Visit | Attending: Cardiology | Admitting: Cardiology

## 2020-11-02 DIAGNOSIS — R9431 Abnormal electrocardiogram [ECG] [EKG]: Secondary | ICD-10-CM | POA: Insufficient documentation

## 2020-11-02 DIAGNOSIS — R0600 Dyspnea, unspecified: Secondary | ICD-10-CM

## 2020-11-02 DIAGNOSIS — R0609 Other forms of dyspnea: Secondary | ICD-10-CM

## 2020-11-02 LAB — MYOCARDIAL PERFUSION IMAGING
LV dias vol: 104 mL (ref 46–106)
LV sys vol: 40 mL
Peak HR: 81 {beats}/min
Rest HR: 68 {beats}/min
SDS: 0
SRS: 2
SSS: 2
TID: 0.93

## 2020-11-02 MED ORDER — TECHNETIUM TC 99M TETROFOSMIN IV KIT
30.5000 | PACK | Freq: Once | INTRAVENOUS | Status: AC | PRN
  Administered 2020-11-02: 30.5 via INTRAVENOUS
  Filled 2020-11-02: qty 31

## 2020-11-02 MED ORDER — TECHNETIUM TC 99M TETROFOSMIN IV KIT
10.7000 | PACK | Freq: Once | INTRAVENOUS | Status: AC | PRN
  Administered 2020-11-02: 10.7 via INTRAVENOUS
  Filled 2020-11-02: qty 11

## 2020-11-02 MED ORDER — REGADENOSON 0.4 MG/5ML IV SOLN
0.4000 mg | Freq: Once | INTRAVENOUS | Status: AC
  Administered 2020-11-02: 0.4 mg via INTRAVENOUS

## 2020-11-10 ENCOUNTER — Other Ambulatory Visit: Payer: Self-pay

## 2020-11-10 ENCOUNTER — Ambulatory Visit (INDEPENDENT_AMBULATORY_CARE_PROVIDER_SITE_OTHER)
Admission: RE | Admit: 2020-11-10 | Discharge: 2020-11-10 | Disposition: A | Discharge status: Home / Self Care | Payer: HMO | Source: Ambulatory Visit | Attending: Internal Medicine | Admitting: Internal Medicine

## 2020-11-10 DIAGNOSIS — R9389 Abnormal findings on diagnostic imaging of other specified body structures: Secondary | ICD-10-CM

## 2020-11-10 DIAGNOSIS — J849 Interstitial pulmonary disease, unspecified: Secondary | ICD-10-CM | POA: Diagnosis not present

## 2020-11-10 DIAGNOSIS — R059 Cough, unspecified: Secondary | ICD-10-CM

## 2020-11-10 DIAGNOSIS — J479 Bronchiectasis, uncomplicated: Secondary | ICD-10-CM | POA: Diagnosis not present

## 2020-11-10 DIAGNOSIS — I700 Atherosclerosis of aorta: Secondary | ICD-10-CM | POA: Diagnosis not present

## 2020-11-15 ENCOUNTER — Telehealth: Payer: Self-pay

## 2020-11-15 ENCOUNTER — Other Ambulatory Visit: Payer: Self-pay | Admitting: Internal Medicine

## 2020-11-15 NOTE — Telephone Encounter (Signed)
Fine with me if Kayla Craig is able to accept.

## 2020-11-16 NOTE — Telephone Encounter (Signed)
Yes, I can accept her 

## 2020-11-16 NOTE — Telephone Encounter (Signed)
Should ideally come back after seeing pulm.    Schedule f/u visit

## 2020-12-06 ENCOUNTER — Ambulatory Visit: Payer: HMO | Admitting: Pulmonary Disease

## 2020-12-06 ENCOUNTER — Other Ambulatory Visit: Payer: Self-pay

## 2020-12-06 ENCOUNTER — Encounter: Payer: Self-pay | Admitting: Pulmonary Disease

## 2020-12-06 VITALS — BP 144/70 | HR 89 | Ht 64.0 in | Wt 160.4 lb

## 2020-12-06 DIAGNOSIS — R768 Other specified abnormal immunological findings in serum: Secondary | ICD-10-CM | POA: Diagnosis not present

## 2020-12-06 DIAGNOSIS — J849 Interstitial pulmonary disease, unspecified: Secondary | ICD-10-CM

## 2020-12-06 NOTE — Patient Instructions (Addendum)
  Blood work was ordered.  Have a foot xray today.     Medications changes include :   start pepcid ( famotidine ) 40 mg daily.  Then start pantoprazole 40 mg every other day.   Your prescription(s) have been submitted to your pharmacy. Please take as directed and contact our office if you believe you are having problem(s) with the medication(s).     Please followup in 6 months

## 2020-12-06 NOTE — Progress Notes (Signed)
Subjective:    Patient ID: Kayla Craig, female    DOB: January 21, 1936, 85 y.o.   MRN: [id number]  HPI The patient is here for follow up of their chronic medical problems, including chronic cough  - pulm fibrosis, htn, hypothyroid, DM, hichol  Had chronic cough since March - ct of lungs suggsetive of pulm fibrosis - saw pulmonary yesterday - likely IPF.  To have PFTs, Echo to eval for pulm htn.  The zpak improved her cough and she now only has coughing here and there.     Sunday - she was sitting outside and got up and her foot went on 1/2 grass/ 1/2 pavement and fell.  She has swelling and pain in her lateral right foot/toe.  No N/T.  Tylenol helps.      Medications and allergies reviewed with patient and updated if appropriate.  Patient Active Problem List   Diagnosis Date Noted   Cough 11/01/2020   Abnormal chest x-ray 10/31/2020   Diabetes mellitus type 2 with complications (HCC) 02/26/2020   Primary osteoarthritis involving multiple joints 02/17/2019   Routine health maintenance 05/10/2012   Hyperlipidemia associated with type 2 diabetes mellitus (HCC) 10/20/2009   GERD 03/25/2008   Back pain 10/03/2007   Hypothyroidism 02/05/2007   B12 deficiency 02/05/2007   Vitamin D deficiency 02/05/2007   Essential hypertension, benign 02/05/2007    Current Outpatient Medications on File Prior to Visit  Medication Sig Dispense Refill   acetaminophen (TYLENOL) 500 MG tablet Take 500 mg by mouth every 6 (six) hours as needed.     amLODipine (NORVASC) 10 MG tablet Take 1 tablet by mouth once daily 90 tablet 0   aspirin 81 MG EC tablet Take 81 mg by mouth daily.     Calcium Carbonate-Vit D-Min (CALCIUM 1200 PO) Take by mouth.     carvedilol (COREG) 6.25 MG tablet Take 1 tablet by mouth twice daily 180 tablet 0   Cholecalciferol (VITAMIN D-3) 1000 UNITS CAPS Take 1 capsule by mouth daily.     levothyroxine (SYNTHROID) 50 MCG tablet Take 50 mcg by mouth daily before breakfast.      losartan (COZAAR) 100 MG tablet Take 1 tablet by mouth once daily 90 tablet 0   metFORMIN (GLUCOPHAGE XR) 500 MG 24 hr tablet Take 1 tablet (500 mg total) by mouth daily with breakfast. 90 tablet 3   pantoprazole (PROTONIX) 40 MG tablet Take 1 tablet (40 mg total) by mouth daily. 90 tablet 3   pravastatin (PRAVACHOL) 20 MG tablet Take 1 tablet by mouth once daily 90 tablet 0   vitamin B-12 (CYANOCOBALAMIN) 1000 MCG tablet Take 1,000 mcg by mouth daily.     No current facility-administered medications on file prior to visit.    Past Medical History:  Diagnosis Date   B12 deficiency    Back pain    Chronic   Goiter    Grief    Grief and loss related depression '08   Hypertension    TMJ (dislocation of temporomandibular joint)    Right Side   Vitamin D deficiency     Past Surgical History:  Procedure Laterality Date   EYE SURGERY  2010   cataract surgery with implants   KNEE SURGERY     Right Arthroscopy '05 Dr Charlann Boxer   THYROIDECTOMY     August '09 for goiter (Dr Jearld Fenton)   TUBAL LIGATION      Social History   Socioeconomic History   Marital status: Single  Spouse name: Not on file   Number of children: 2   Years of education: 12   Highest education level: Not on file  Occupational History   Occupation: retired    Associate Professor: RETIRED  Tobacco Use   Smoking status: Never   Smokeless tobacco: Never  Vaping Use   Vaping Use: Never used  Substance and Sexual Activity   Alcohol use: No    Alcohol/week: 0.0 standard drinks   Drug use: Never   Sexual activity: Never  Other Topics Concern   Not on file  Social History Narrative   HSG. [place]. 2 sons- 07-25-60 (died lung cancer 2022/07/23), July 26, 2067; 3 grandchildren. 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 End of life issues: patient would want CPR, DNI, not to be maintained in a vegative state. Laymen's guide packet provided (july '11)    Social Determinants of Health   Financial Resource Strain: Low Risk    Difficulty of Paying Living Expenses: Not hard at all  Food Insecurity: No Food Insecurity   Worried About Programme researcher, broadcasting/film/video in the Last Year: Never true   Barista in the Last Year: Never true  Transportation Needs: No Transportation Needs   Lack of Transportation (Medical): No   Lack of Transportation (Non-Medical): No  Physical Activity: Not on file  Stress: Not on file  Social Connections: Not on file    Family History  Problem Relation Age of Onset   Cancer Mother    Heart disease Father        CAD/MI   Heart disease Brother        CAD/MI   Heart disease Brother        CAD/MI   Cancer Brother    Diabetes Neg Hx     Review of Systems  Constitutional:  Negative for chills and fever.  Respiratory:  Positive for chest tightness (occ) and shortness of breath (once in a while if she gets up and does a lot). Negative for cough and wheezing.   Cardiovascular:  Negative for chest pain, palpitations and leg swelling.  Neurological:  Negative for light-headedness and headaches.      Objective:   Vitals:   12/07/20 1420  BP: (!) 146/80  Pulse: 71  Temp: 98.8 F (37.1 C)  SpO2: 97%   BP Readings from Last 3 Encounters:  12/07/20 (!) 146/80  12/06/20 (!) 144/70  11/01/20 140/80   Wt Readings from Last 3 Encounters:  12/07/20 161 lb (73 kg)  12/06/20 160 lb 6.4 oz (72.8 kg)  11/02/20 166 lb (75.3 kg)   Body mass index is 27.64 kg/m.   Physical Exam    Constitutional: Appears well-developed and well-nourished. No distress.  HENT:  Head: Normocephalic and atraumatic.  Neck: Neck supple. No tracheal deviation present. No thyromegaly present.  No cervical lymphadenopathy Cardiovascular: Normal rate, regular rhythm and normal heart sounds.   No murmur heard. No carotid bruit .  No edema Pulmonary/Chest: Effort normal and breath sounds normal. No respiratory distress. No has no  wheezes. No rales.  Msk: right distal 5th metatarsal and fifth toe slightly swollen and erythematous - tender to palp, bruising 3-5th MTP joints on dorsal surface Skin: Skin is warm and dry. Not diaphoretic.  Psychiatric: Normal mood and affect. Behavior is normal.   Diabetic Foot Exam - Simple   Simple Foot Form Diabetic Foot exam was performed with the following findings: Yes  Visual Inspection No deformities, no ulcerations, no other skin breakdown bilaterally: Yes See comments: Yes Sensation Testing Intact to touch and monofilament testing bilaterally: Yes Pulse Check Posterior Tibialis and Dorsalis pulse intact bilaterally: Yes Comments right distal 5th metatarsal and fifth toe slightly swollen and erythematous - tender to palp, bruising 3-5th MTP joints on dorsal surface       Assessment & Plan:    See Problem List for Assessment and Plan of chronic medical problems.    This visit occurred during the SARS-CoV-2 public health emergency.  Safety protocols were in place, including screening questions prior to the visit, additional usage of staff PPE, and extensive cleaning of exam room while observing appropriate contact time as indicated for disinfecting solutions.

## 2020-12-06 NOTE — Progress Notes (Signed)
Kayla Craig    [id number]    1935-09-04  Primary Care Physician:Crawford, Austin Miles, MD  Referring Physician: Iran Ouch, MD [address] [place],  [place] [address]  Chief complaint: Consult for interstitial lung disease  HPI: 85 year old with history of hypertension, diabetes, hyperlipidemia, allergies, GERD on PPI, peripheral artery disease She has a dry nonproductive cough since March 2022.  No associated dyspnea fevers or chills.  Given Z-Pak by primary care in July 2022 Chest x-ray and follow-up CT showed interstitial lung disease and she has been referred to pulmonary for further evaluation  Has occasional joint pain.  No morning stiffness, difficulty swallowing, denies dry mouth, dry eyes  Pets: No pets Occupation: Used to work in a Transport planner Exposures: Was exposed to acetone while working.  No ongoing exposures.  No mold, hot tub, Jacuzzi.  No feather pillows or comforters Smoking history: Never smoker Travel history: No significant travel history Relevant family history: No family history of lung disease   Outpatient Encounter Medications as of 12/06/2020  Medication Sig   acetaminophen (TYLENOL) 500 MG tablet Take 500 mg by mouth every 6 (six) hours as needed.   amLODipine (NORVASC) 10 MG tablet Take 1 tablet by mouth once daily   aspirin 81 MG EC tablet Take 81 mg by mouth daily.   Calcium Carbonate-Vit D-Min (CALCIUM 1200 PO) Take by mouth.   carvedilol (COREG) 6.25 MG tablet Take 1 tablet by mouth twice daily   Cholecalciferol (VITAMIN D-3) 1000 UNITS CAPS Take 1 capsule by mouth daily.   levothyroxine (SYNTHROID) 50 MCG tablet Take 50 mcg by mouth daily before breakfast.   losartan (COZAAR) 100 MG tablet Take 1 tablet by mouth once daily   metFORMIN (GLUCOPHAGE XR) 500 MG 24 hr tablet Take 1 tablet (500 mg total) by mouth daily with breakfast.   pantoprazole (PROTONIX) 40 MG tablet Take 1 tablet (40 mg total) by mouth daily.    pravastatin (PRAVACHOL) 20 MG tablet Take 1 tablet by mouth once daily   vitamin B-12 (CYANOCOBALAMIN) 1000 MCG tablet Take 1,000 mcg by mouth daily.   fluticasone (FLONASE) 50 MCG/ACT nasal spray Place 2 sprays into both nostrils daily. (Patient not taking: Reported on 12/06/2020)   [DISCONTINUED] azithromycin (ZITHROMAX) 250 MG tablet Take two tabs the first day and then one tab daily for four days   [DISCONTINUED] Baclofen 5 MG TABS Take 5 mg by mouth 2 (two) times daily as needed (back pain).   [DISCONTINUED] erythromycin ophthalmic ointment 1 application at bedtime.   [DISCONTINUED] EUTHYROX 50 MCG tablet TAKE 1 TABLET BY MOUTH ONCE DAILY BEFORE BREAKFAST   [DISCONTINUED] famotidine (PEPCID) 20 MG tablet Take 20 mg by mouth 2 (two) times daily.   No facility-administered encounter medications on file as of 12/06/2020.    Allergies as of 12/06/2020 - Review Complete 12/06/2020  Allergen Reaction Noted   Codeine Nausea And Vomiting     Past Medical History:  Diagnosis Date   B12 deficiency    Back pain    Chronic   Goiter    Grief    Grief and loss related depression '08   Hypertension    TMJ (dislocation of temporomandibular joint)    Right Side   Vitamin D deficiency     Past Surgical History:  Procedure Laterality Date   EYE SURGERY  2010   cataract surgery with implants   KNEE SURGERY     Right  Arthroscopy '05 Dr Charlann Boxer   THYROIDECTOMY     August '09 for goiter (Dr Jearld Fenton)   TUBAL LIGATION      Family History  Problem Relation Age of Onset   Cancer Mother    Heart disease Father        CAD/MI   Heart disease Brother        CAD/MI   Heart disease Brother        CAD/MI   Cancer Brother    Diabetes Neg Hx     Social History   Socioeconomic History   Marital status: Single    Spouse name: Not on file   Number of children: 2   Years of education: 12   Highest education level: Not on file  Occupational History   Occupation: retired    Associate Professor: RETIRED   Tobacco Use   Smoking status: Never   Smokeless tobacco: Never  Vaping Use   Vaping Use: Never used  Substance and Sexual Activity   Alcohol use: No    Alcohol/week: 0.0 standard drinks   Drug use: Never   Sexual activity: Never  Other Topics Concern   Not on file  Social History Narrative   [employer]. [place]. 2 sons- 05-Aug-2060 (died lung cancer 07/28/2022), 2067/08/06; 3 grandchildren. 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 End of life issues: patient would want CPR, DNI, not to be maintained in a vegative state. Laymen's guide packet provided (july '11)   Social Determinants of Health   Financial Resource Strain: Low Risk    Difficulty of Paying Living Expenses: Not hard at all  Food Insecurity: No Food Insecurity   Worried About Programme researcher, broadcasting/film/video in the Last Year: Never true   Barista in the Last Year: Never true  Transportation Needs: No Transportation Needs   Lack of Transportation (Medical): No   Lack of Transportation (Non-Medical): No  Physical Activity: Not on file  Stress: Not on file  Social Connections: Not on file  Intimate Partner Violence: Not on file    Review of systems: Review of Systems  Constitutional: Negative for fever and chills.  HENT: Negative.   Eyes: Negative for blurred vision.  Respiratory: as per HPI  Cardiovascular: Negative for chest pain and palpitations.  Gastrointestinal: Negative for vomiting, diarrhea, blood per rectum. Genitourinary: Negative for dysuria, urgency, frequency and hematuria.  Musculoskeletal: Negative for myalgias, back pain and joint pain.  Skin: Negative for itching and rash.  Neurological: Negative for dizziness, tremors, focal weakness, seizures and loss of consciousness.  Endo/Heme/Allergies: Negative for environmental allergies.  Psychiatric/Behavioral: Negative for depression, suicidal ideas and hallucinations.  All other  systems reviewed and are negative.  Physical Exam: Blood pressure (!) 144/70, pulse 89, height 5\' 4"  (1.626 m), weight 160 lb 6.4 oz (72.8 kg), SpO2 94 %. Gen:      No acute distress HEENT:  EOMI, sclera anicteric Neck:     No masses; no thyromegaly Lungs:    Bibasal crackles CV:         Regular rate and rhythm; no murmurs Abd:      + bowel sounds; soft, non-tender; no palpable masses, no distension Ext:    No edema; adequate peripheral perfusion Skin:      Warm and dry; no rash Neuro: alert and oriented x 3 Psych: normal mood and affect  Data Reviewed: Imaging: CT chest 11/10/2020-peripheral reticulation, traction bronchiectasis with minimal  honeycombing.  UIP pattern pulmonary fibrosis I have reviewed the images personally.  PFTs:  Labs:  Assessment:  Interstitial lung disease, pulmonary fibrosis Has CT shows pattern of pulmonary fibrosis in UIP.  She does not have any exposures, signs and symptoms of connective tissue disease This is likely to be IPF  Check CTD serologies, PFTs for work-up I will review her scan at multidisciplinary ILD conference.  She may need consideration of antifibrotics based on the work-up and discussion at multidisciplinary conference.  Reviewed in detail with patient in office today  Enlarged pulmonary artery Echocardiogram for evaluation of pulmonary hypertension  Plan/Recommendations: CTD serologies, PFTs Discussion at multidisciplinary ILD conference  Chilton Greathouse MD [facility] 12/06/2020, 11:04 AM  CC: Iran Ouch, MD

## 2020-12-06 NOTE — Patient Instructions (Signed)
I have reviewed your scan which shows scarring in the lung.  This condition is called pulmonary fibrosis We will get some labs today for further work-up of scarring in the lung We will also schedule pulmonary function test I will review your scan at her conference with other experts in the field Follow-up in clinic in 1 month for review of tests and plan for next steps

## 2020-12-07 ENCOUNTER — Ambulatory Visit (INDEPENDENT_AMBULATORY_CARE_PROVIDER_SITE_OTHER): Payer: HMO | Admitting: Internal Medicine

## 2020-12-07 ENCOUNTER — Encounter: Payer: Self-pay | Admitting: Internal Medicine

## 2020-12-07 ENCOUNTER — Ambulatory Visit (INDEPENDENT_AMBULATORY_CARE_PROVIDER_SITE_OTHER): Payer: HMO

## 2020-12-07 VITALS — BP 146/80 | HR 71 | Temp 98.8°F | Ht 64.0 in | Wt 161.0 lb

## 2020-12-07 DIAGNOSIS — E785 Hyperlipidemia, unspecified: Secondary | ICD-10-CM | POA: Diagnosis not present

## 2020-12-07 DIAGNOSIS — E538 Deficiency of other specified B group vitamins: Secondary | ICD-10-CM | POA: Diagnosis not present

## 2020-12-07 DIAGNOSIS — J84112 Idiopathic pulmonary fibrosis: Secondary | ICD-10-CM | POA: Diagnosis not present

## 2020-12-07 DIAGNOSIS — E039 Hypothyroidism, unspecified: Secondary | ICD-10-CM | POA: Diagnosis not present

## 2020-12-07 DIAGNOSIS — S92351A Displaced fracture of fifth metatarsal bone, right foot, initial encounter for closed fracture: Secondary | ICD-10-CM

## 2020-12-07 DIAGNOSIS — M79671 Pain in right foot: Secondary | ICD-10-CM

## 2020-12-07 DIAGNOSIS — E559 Vitamin D deficiency, unspecified: Secondary | ICD-10-CM

## 2020-12-07 DIAGNOSIS — K219 Gastro-esophageal reflux disease without esophagitis: Secondary | ICD-10-CM | POA: Diagnosis not present

## 2020-12-07 DIAGNOSIS — E1169 Type 2 diabetes mellitus with other specified complication: Secondary | ICD-10-CM | POA: Diagnosis not present

## 2020-12-07 DIAGNOSIS — I10 Essential (primary) hypertension: Secondary | ICD-10-CM

## 2020-12-07 DIAGNOSIS — E118 Type 2 diabetes mellitus with unspecified complications: Secondary | ICD-10-CM

## 2020-12-07 MED ORDER — FAMOTIDINE 40 MG PO TABS: 40.0000 mg | ORAL_TABLET | Freq: Every day | ORAL | 1 refills | Status: DC

## 2020-12-07 NOTE — Assessment & Plan Note (Addendum)
Chronic BP elevated here today - typically a little better - elevation may be from pain Continue amlodipine 10 g qd, coreg 6.25 mg bid, losartan 100 mg qd cmp

## 2020-12-07 NOTE — Assessment & Plan Note (Signed)
Chronic  Controlled Start pepcid 40 mg daily Start tapering off pantoprazole 40 mg daily

## 2020-12-07 NOTE — Assessment & Plan Note (Signed)
Chronic  Clinically euthyroid Currently taking levothyroxine 50 mcg qd Check tsh  Titrate med dose if needed  

## 2020-12-07 NOTE — Assessment & Plan Note (Signed)
Chronic Check lipid panel  Continue pravastatin 20 mg qd Regular exercise and healthy diet encouraged  

## 2020-12-07 NOTE — Assessment & Plan Note (Signed)
Acute Concern for fracture - xray today Xray did show Minimally displaced oblique fracture through the fifth metatarsal shaft. Referral ordered for sports medicine

## 2020-12-07 NOTE — Assessment & Plan Note (Signed)
Chronic Taking vitamin D daily Check vitamin D level  

## 2020-12-07 NOTE — Assessment & Plan Note (Signed)
Chronic Check a1c Continue metformin 500 mg qd

## 2020-12-07 NOTE — Assessment & Plan Note (Signed)
Chronic Taking B12 Check B12 level 

## 2020-12-08 ENCOUNTER — Other Ambulatory Visit: Payer: Self-pay

## 2020-12-08 ENCOUNTER — Ambulatory Visit: Payer: HMO | Admitting: Sports Medicine

## 2020-12-08 VITALS — Wt 161.0 lb

## 2020-12-08 DIAGNOSIS — S92354A Nondisplaced fracture of fifth metatarsal bone, right foot, initial encounter for closed fracture: Secondary | ICD-10-CM | POA: Diagnosis not present

## 2020-12-08 LAB — LIPID PANEL
Cholesterol: 201 mg/dL — ABNORMAL HIGH (ref 0–200)
HDL: 43.9 mg/dL (ref >39.00)
NonHDL: 156.63
Total CHOL/HDL Ratio: 5
Triglycerides: 352 mg/dL — ABNORMAL HIGH (ref 0.0–149.0)
VLDL: 70.4 mg/dL — ABNORMAL HIGH (ref 0.0–40.0)

## 2020-12-08 LAB — CBC WITH DIFFERENTIAL/PLATELET
Basophils Absolute: 0 10*3/uL (ref 0.0–0.1)
Basophils Relative: 0.3 % (ref 0.0–3.0)
Eosinophils Absolute: 0.2 10*3/uL (ref 0.0–0.7)
Eosinophils Relative: 2.7 % (ref 0.0–5.0)
HCT: 37.2 % (ref 36.0–46.0)
Hemoglobin: 12.3 g/dL (ref 12.0–15.0)
Lymphocytes Relative: 28.4 % (ref 12.0–46.0)
Lymphs Abs: 2.4 10*3/uL (ref 0.7–4.0)
MCHC: 33.1 g/dL (ref 30.0–36.0)
MCV: 85.3 fl (ref 78.0–100.0)
Monocytes Absolute: 0.9 10*3/uL (ref 0.1–1.0)
Monocytes Relative: 10.3 % (ref 3.0–12.0)
Neutro Abs: 4.9 10*3/uL (ref 1.4–7.7)
Neutrophils Relative %: 58.3 % (ref 43.0–77.0)
Platelets: 259 10*3/uL (ref 150.0–400.0)
RBC: 4.36 Mil/uL (ref 3.87–5.11)
RDW: 13.7 % (ref 11.5–15.5)
WBC: 8.4 10*3/uL (ref 4.0–10.5)

## 2020-12-08 LAB — COMPREHENSIVE METABOLIC PANEL
ALT: 12 U/L (ref 0–35)
AST: 15 U/L (ref 0–37)
Albumin: 4.3 g/dL (ref 3.5–5.2)
Alkaline Phosphatase: 53 U/L (ref 39–117)
BUN: 22 mg/dL (ref 6–23)
CO2: 26 mEq/L (ref 19–32)
Calcium: 9.6 mg/dL (ref 8.4–10.5)
Chloride: 101 mEq/L (ref 96–112)
Creatinine, Ser: 1.19 mg/dL (ref 0.40–1.20)
GFR: 42.39 mL/min — ABNORMAL LOW (ref >60.00)
Glucose, Bld: 72 mg/dL (ref 70–99)
Potassium: 4.3 mEq/L (ref 3.5–5.1)
Sodium: 136 mEq/L (ref 135–145)
Total Bilirubin: 0.6 mg/dL (ref 0.2–1.2)
Total Protein: 7.6 g/dL (ref 6.0–8.3)

## 2020-12-08 LAB — HEMOGLOBIN A1C: Hgb A1c MFr Bld: 6.2 % (ref 4.6–6.5)

## 2020-12-08 LAB — VITAMIN B12: Vitamin B-12: 778 pg/mL (ref 211–911)

## 2020-12-08 LAB — VITAMIN D 25 HYDROXY (VIT D DEFICIENCY, FRACTURES): VITD: 33.46 ng/mL (ref 30.00–100.00)

## 2020-12-08 LAB — LDL CHOLESTEROL, DIRECT: Direct LDL: 103 mg/dL

## 2020-12-08 LAB — TSH: TSH: 3.02 u[IU]/mL (ref 0.35–5.50)

## 2020-12-08 NOTE — Addendum Note (Signed)
Addended by: Miguel Aschoff on: 12/08/2020 03:02 PM   Modules accepted: Orders

## 2020-12-08 NOTE — Progress Notes (Signed)
Kayla Craig D.[facility] [address] [place] [address] Phone: [phone]   Assessment and Plan:    I, Philbert Riser, PhD, LAT, ATC, am acting as a Neurosurgeon for [employer], DO, CAQSM.  1. Closed nondisplaced fracture of fifth metatarsal bone of right foot, initial encounter -Acute, initial sports medicine visit - Use postop shoe when ambulatory.  Weightbearing as tolerated.  Postop shoe provided - Follow-up in 1 week for repeat right foot x-rays to ensure no displacement - Use Tylenol as needed for pain control - Reviewed imaging From 12/07/2020.  My interpretation: Oblique fracture of fifth metatarsal with minimal displacement    All pertinent previous records reviewed prior to and during visit.    Follow Up: In 1 week for repeat right foot x-rays and reevaluation.    Subjective:    Chief Complaint: R foot pain  HPI: Pt is an 85 y/o female c/o R foot pain due to a fx of the 5th MT.   Patient states that she was sitting outside when she stood up walking across the grass, tripped and fell.  Was able to ambulate after fall with right foot tenderness.  Was evaluated yesterday and had x-rays done.  York Spaniel that she had a call this morning telling her she had a foot fracture and told to come into clinic.  Has been using Tylenol for pain relief.  Denies additional trauma, falls.  Denies numbness/tingling just weakness in extremities.  Patient presents today wearing crocs.  Mild discomfort with weightbearing.  Dx imaging: 12/07/20 R foot XR   Relevant Historical Information: DM type II, vitamin D deficiency  Additional pertinent review of systems negative.   Current Outpatient Medications:    acetaminophen (TYLENOL) 500 MG tablet, Take 500 mg by mouth every 6 (six) hours as needed., Disp: , Rfl:    amLODipine (NORVASC) 10 MG tablet, Take 1 tablet by mouth once daily, Disp: 90 tablet, Rfl: 0   aspirin 81 MG EC tablet, Take 81 mg by mouth  daily., Disp: , Rfl:    Calcium Carbonate-Vit D-Min (CALCIUM 1200 PO), Take by mouth., Disp: , Rfl:    carvedilol (COREG) 6.25 MG tablet, Take 1 tablet by mouth twice daily, Disp: 180 tablet, Rfl: 0   Cholecalciferol (VITAMIN D-3) 1000 UNITS CAPS, Take 1 capsule by mouth daily., Disp: , Rfl:    famotidine (PEPCID) 40 MG tablet, Take 1 tablet (40 mg total) by mouth daily., Disp: 90 tablet, Rfl: 1   levothyroxine (SYNTHROID) 50 MCG tablet, Take 50 mcg by mouth daily before breakfast., Disp: , Rfl:    losartan (COZAAR) 100 MG tablet, Take 1 tablet by mouth once daily, Disp: 90 tablet, Rfl: 0   metFORMIN (GLUCOPHAGE XR) 500 MG 24 hr tablet, Take 1 tablet (500 mg total) by mouth daily with breakfast., Disp: 90 tablet, Rfl: 3   pantoprazole (PROTONIX) 40 MG tablet, Take 1 tablet (40 mg total) by mouth daily., Disp: 90 tablet, Rfl: 3   pravastatin (PRAVACHOL) 20 MG tablet, Take 1 tablet by mouth once daily, Disp: 90 tablet, Rfl: 0   vitamin B-12 (CYANOCOBALAMIN) 1000 MCG tablet, Take 1,000 mcg by mouth daily., Disp: , Rfl:    Objective:     Vitals:   12/08/20 1436  Weight: 161 lb (73 kg)      Body mass index is 27.64 kg/m.    Physical Exam:    Gen: Appears well, nad, nontoxic and pleasant Psych: Alert and oriented, appropriate mood  and affect Neuro: sensation intact, strength is 5/5 with df/pf/inv/ev, muscle tone wnl Skin: no susupicious lesions or rashes  Right foot and ankle: no deformity, no swelling or effusion Ecchymosis over base of third and fourth toe TTP fifth metatarsal shaft  NTTP over fibular head, lat mal, medial mal, achilles, navicular, ATFL, CFL, deltoid, calcaneous or midfoot ROM DF 30, PF 45, inv/ev intact Negative ant drawer, talar tilt, rotation test, squeeze test. Neg thompson No pain with resisted inversion or eversion  Normal gait.  Electronically signed by:  Kayla Craig D.[facility] 2:58 PM 12/08/20

## 2020-12-08 NOTE — Patient Instructions (Signed)
Thank you for coming in today.  Use postop shoe when walking until reevaluation next week.  Follow-up in 1 week for repeat x-rays to ensure there is been no change at fracture site.  May use Tylenol as needed for pain control.

## 2020-12-09 LAB — ANTI-NUCLEAR AB-TITER (ANA TITER): ANA Titer 1: 1:1280 {titer} — ABNORMAL HIGH

## 2020-12-09 LAB — HYPERSENSITIVITY PNEUMONITIS
A. Pullulans Abs: NEGATIVE
A.Fumigatus #1 Abs: NEGATIVE
Micropolyspora faeni, IgG: NEGATIVE
Pigeon Serum Abs: NEGATIVE
Thermoact. Saccharii: NEGATIVE
Thermoactinomyces vulgaris, IgG: NEGATIVE

## 2020-12-09 LAB — ANA,IFA RA DIAG PNL W/RFLX TIT/PATN
Anti Nuclear Antibody (ANA): POSITIVE — AB
Cyclic Citrullin Peptide Ab: <16 UNITS
Rheumatoid fact SerPl-aCnc: <14 IU/mL (ref <14)

## 2020-12-09 LAB — ANTI-SCLERODERMA ANTIBODY: Scleroderma (Scl-70) (ENA) Antibody, IgG: <1 AI

## 2020-12-09 LAB — SJOGREN'S SYNDROME ANTIBODS(SSA + SSB)
SSA (Ro) (ENA) Antibody, IgG: <1 AI
SSB (La) (ENA) Antibody, IgG: <1 AI

## 2020-12-09 LAB — ANCA SCREEN W REFLEX TITER

## 2020-12-15 ENCOUNTER — Ambulatory Visit: Payer: HMO | Admitting: Sports Medicine

## 2020-12-15 ENCOUNTER — Ambulatory Visit (INDEPENDENT_AMBULATORY_CARE_PROVIDER_SITE_OTHER): Payer: HMO

## 2020-12-15 ENCOUNTER — Other Ambulatory Visit: Payer: Self-pay

## 2020-12-15 VITALS — BP 138/70 | HR 70 | Ht 64.0 in | Wt 161.0 lb

## 2020-12-15 DIAGNOSIS — E1169 Type 2 diabetes mellitus with other specified complication: Secondary | ICD-10-CM

## 2020-12-15 DIAGNOSIS — S92354G Nondisplaced fracture of fifth metatarsal bone, right foot, subsequent encounter for fracture with delayed healing: Secondary | ICD-10-CM | POA: Diagnosis not present

## 2020-12-15 DIAGNOSIS — E039 Hypothyroidism, unspecified: Secondary | ICD-10-CM

## 2020-12-15 DIAGNOSIS — E118 Type 2 diabetes mellitus with unspecified complications: Secondary | ICD-10-CM

## 2020-12-15 DIAGNOSIS — K219 Gastro-esophageal reflux disease without esophagitis: Secondary | ICD-10-CM

## 2020-12-15 DIAGNOSIS — M79671 Pain in right foot: Secondary | ICD-10-CM | POA: Diagnosis not present

## 2020-12-15 DIAGNOSIS — I10 Essential (primary) hypertension: Secondary | ICD-10-CM

## 2020-12-15 DIAGNOSIS — S92354A Nondisplaced fracture of fifth metatarsal bone, right foot, initial encounter for closed fracture: Secondary | ICD-10-CM | POA: Diagnosis not present

## 2020-12-15 NOTE — Progress Notes (Signed)
 Chronic Care Management Pharmacy Note  12/15/2020 Name:  Kayla Craig MRN:  [id number] DOB:  10/15/1935   Summary: -Patient recently has broken a bone in her right foot - has follow up later today to assess healing - notes to using walking boot and APAP as needed for pain -Has follow up for additional pulmonary testing 12/28/20 - noted new diagnosis of pulmonary fibrosis -Notes that she is weaning off of pantoprazole - currently taking every other day for another 2 weeks, then will stop, has been taking famotidine 40mg daily - reports that GERD has been controlled -since switching to metformin XR - reports that diarrhea has improved, -A1c remains at goal and stable at 6.2% -LDL shows improvement down to 103 with last check  Recommendations/Changes made from today's visit: -Recommending no changes to medications at this time, patient to continue using APAP as needed for pain in her foot -Patient will stop pantoprazole in 2 weeks, aware to reach out should GERD become uncontrolled -Possible in future to increase statin intensity per cards note, as LDL is improving, will plan to discuss at a later date   Subjective: Kayla Craig is an 85 y.o. year old female who is a primary patient of Burns, Stacy J, MD.  The CCM team was consulted for assistance with disease management and care coordination needs.    Engaged with patient by telephone for follow up visit in response to provider referral for pharmacy case management and/or care coordination services.   Consent to Services:  The patient was given the following information about Chronic Care Management services today, agreed to services, and gave verbal consent: 1. CCM service includes personalized support from designated clinical staff supervised by the primary care provider, including individualized plan of care and coordination with other care providers 2. 24/7 contact phone numbers for assistance for urgent and routine care needs. 3.  Service will only be billed when office clinical staff spend 20 minutes or more in a month to coordinate care. 4. Only one practitioner may furnish and bill the service in a calendar month. 5.The patient may stop CCM services at any time (effective at the end of the month) by phone call to the office staff. 6. The patient will be responsible for cost sharing (co-pay) of up to 20% of the service fee (after annual deductible is met). Patient agreed to services and consent obtained.  Patient Care Team: Burns, Stacy J, MD as PCP - General (Internal Medicine) Digby, Donald, MD (Ophthalmology) Olin, Matthew, MD (Orthopedic Surgery) Szabat, Daniel C, RPH as Pharmacist (Pharmacist)  Recent office visits: 12/07/2020 - Dr. Burns MD - concern for fracture - x-ray shows minimally displaced oblique fracture through fifth metatarsal shaft, referred to sports medicine, start famotidine 40mg daily and pantoprazole 40mg every other day  11/01/2020 - Dr. Burns MD - visit for follow up of cough and abnormal chest x-ray - CT ordered, started on azithromycin for possible atypical pneumonia 09/19/2020 - Dr. Crawford MD - visit for HA, back pain, and cough - started baclofen 5mg BID and advised to take APAP as well   Recent consult visits: 12/08/2020 - Dr. Jackson DO (Sports medicine) - eval for fracture of fifth metatarsal bone of right foot, use post op show when ambulatory, repeat x-ray in 1 week, APAP for pain as needed  12/06/2020 - Dr. Mannam MD (Pulmonology) - CT shows pattern of pulmonary fibrosis, ECHO for eval of pulmonary HTN,  10/25/2020 - Dr. Arida (Cardiology) - f/u regarding PAD -   consider higher intensity statin to reach LDL goal of <70, noted exertional dyspnea with abnormal EKG, suggestive of inferior wall ischemia, chest x-ray ordered for cough  - Chest x-ray suggestive of pulmonary fibrosis or atypical infection - urgent referral to pulmonology   Hospital visits: None in previous 6  months  Objective:  Lab Results  Component Value Date   CREATININE 1.19 12/08/2020   BUN 22 12/08/2020   GFR 42.39 (L) 12/08/2020   GFRNONAA 83 (L) 12/27/2012   GFRAA >90 12/27/2012   NA 136 12/08/2020   K 4.3 12/08/2020   CALCIUM 9.6 12/08/2020   CO2 26 12/08/2020   GLUCOSE 72 12/08/2020    Lab Results  Component Value Date/Time   HGBA1C 6.2 12/08/2020 03:02 PM   HGBA1C 6.2 02/26/2020 02:17 PM   GFR 42.39 (L) 12/08/2020 03:02 PM   GFR 46.34 (L) 02/26/2020 02:17 PM   MICROALBUR 3.4 (H) 02/26/2020 02:17 PM    Last diabetic Eye exam:  No results found for: HMDIABEYEEXA  Last diabetic Foot exam:  No results found for: HMDIABFOOTEX   Lab Results  Component Value Date   CHOL 201 (H) 12/08/2020   HDL 43.90 12/08/2020   LDLDIRECT 103.0 12/08/2020   TRIG 352.0 (H) 12/08/2020   CHOLHDL 5 12/08/2020    Hepatic Function Latest Ref Rng & Units 12/08/2020 02/26/2020 02/17/2019  Total Protein 6.0 - 8.3 g/dL 7.6 8.0 7.7  Albumin 3.5 - 5.2 g/dL 4.3 4.3 4.3  AST 0 - 37 U/L _0 ALT 0 - 35 U/L _1 Alk Phosphatase 39 - 117 U/L 53 54 64  Total Bilirubin 0.2 - 1.2 mg/dL 0.6 0.6 0.5  Bilirubin, Direct 0.0 - 0.3 mg/dL - - -    Lab Results  Component Value Date/Time   TSH 3.02 12/08/2020 03:02 PM   TSH 2.55 02/17/2019 01:25 PM   FREET4 0.81 02/17/2019 01:25 PM   FREET4 0.75 02/12/2018 10:48 AM    CBC Latest Ref Rng & Units 12/08/2020 02/26/2020 02/17/2019  WBC 4.0 - 10.5 K/uL 8.4 8.3 7.5  Hemoglobin 12.0 - 15.0 g/dL 12.3 13.3 13.0  Hematocrit 36.0 - 46.0 % 37.2 39.9 38.3  Platelets 150.0 - 400.0 K/uL 259.0 272.0 250.0    Lab Results  Component Value Date/Time   VD25OH 33.46 12/08/2020 03:02 PM   VD25OH 23.53 (L) 02/17/2019 01:25 PM    Clinical ASCVD: No  The ASCVD Risk score (Arnett DK, et al., 2019) failed to calculate for the following reasons:   The 2019 ASCVD risk score is only valid for ages 28 to 63    Depression screen PHQ 2/9 02/18/2020 02/17/2019  01/28/2018  Decreased Interest 0 0 0  Down, Depressed, Hopeless 0 0 0  PHQ - 2 Score 0 0 0  Altered sleeping - - -  Tired, decreased energy - - -  Change in appetite - - -  Feeling bad or failure about yourself  - - -  Trouble concentrating - - -  Moving slowly or fidgety/restless - - -  Suicidal thoughts - - -  PHQ-9 Score - - -  Difficult doing work/chores - - -     Social History   Tobacco Use  Smoking Status Never  Smokeless Tobacco Never   BP Readings from Last 3 Encounters:  12/07/20 (!) 146/80  12/06/20 (!) 144/70  11/01/20 140/80   Pulse Readings from Last 3 Encounters:  12/07/20 71  12/06/20 89  11/01/20 72   Wt Readings  from Last 3 Encounters:  12/08/20 161 lb (73 kg)  12/07/20 161 lb (73 kg)  12/06/20 160 lb 6.4 oz (72.8 kg)   BMI Readings from Last 3 Encounters:  12/08/20 27.64 kg/m  12/07/20 27.64 kg/m  12/06/20 27.53 kg/m    Assessment/Interventions: Review of patient past medical history, allergies, medications, health status, including review of consultants reports, laboratory and other test data, was performed as part of comprehensive evaluation and provision of chronic care management services.   SDOH:  (Social Determinants of Health) assessments and interventions performed: Yes  SDOH Screenings   Alcohol Screen: Not on file  Depression (PHQ2-9): Low Risk    PHQ-2 Score: 0  Financial Resource Strain: Low Risk    Difficulty of Paying Living Expenses: Not hard at all  Food Insecurity: No Food Insecurity   Worried About Running Out of Food in the Last Year: Never true   Ran Out of Food in the Last Year: Never true  Housing: Not on file  Physical Activity: Not on file  Social Connections: Not on file  Stress: Not on file  Tobacco Use: Low Risk    Smoking Tobacco Use: Never   Smokeless Tobacco Use: Never  Transportation Needs: No Transportation Needs   Lack of Transportation (Medical): No   Lack of Transportation (Non-Medical): No     CCM Care Plan  Allergies  Allergen Reactions   Codeine Nausea And Vomiting    Dizzy    Medications Reviewed Today     Reviewed by Szabat, Daniel C, RPH (Pharmacist) on 12/15/20 at 1043  Med List Status: <None>   Medication Order Taking? Sig Documenting Provider Last Dose Status Informant  acetaminophen (TYLENOL) 500 MG tablet [id number] Yes Take 500 mg by mouth every 6 (six) hours as needed. [provider] Taking Active   amLODipine (NORVASC) 10 MG tablet [id number] Yes Take 1 tablet by mouth once daily Crawford, Elizabeth A, MD Taking Active   aspirin 81 MG EC tablet [id number] Yes Take 81 mg by mouth daily. [provider] Taking Active   Calcium Carbonate-Vit D-Min (CALCIUM 1200 PO) [id number] Yes Take by mouth. [provider] Taking Active   carvedilol (COREG) 6.25 MG tablet [id number] Yes Take 1 tablet by mouth twice daily Arida, Muhammad A, MD Taking Active   Cholecalciferol (VITAMIN D-3) 1000 UNITS CAPS [id number] Yes Take 1 capsule by mouth daily. [provider] Taking Active   famotidine (PEPCID) 40 MG tablet [id number] Yes Take 1 tablet (40 mg total) by mouth daily. Burns, Stacy J, MD Taking Active   levothyroxine (SYNTHROID) 50 MCG tablet [id number] Yes Take 50 mcg by mouth daily before breakfast. [provider] Taking Active   losartan (COZAAR) 100 MG tablet [id number] Yes Take 1 tablet by mouth once daily Crawford, Elizabeth A, MD Taking Active   metFORMIN (GLUCOPHAGE XR) 500 MG 24 hr tablet [id number] Yes Take 1 tablet (500 mg total) by mouth daily with breakfast. Crawford, Elizabeth A, MD Taking Active   pantoprazole (PROTONIX) 40 MG tablet [id number] Yes Take 1 tablet (40 mg total) by mouth daily.  Patient taking differently: Take 40 mg by mouth every other day. For 2 weeks then stop   Crawford, Elizabeth A, MD Taking Active   pravastatin (PRAVACHOL) 20 MG tablet [id number] Yes Take 1 tablet by mouth once daily Crawford, Elizabeth A,  MD Taking Active   vitamin B-12 (CYANOCOBALAMIN) 1000 MCG tablet [id number] Yes Take 1,000 mcg by mouth daily. [provider] Taking Active               Patient Active Problem List   Diagnosis Date Noted   IPF (idiopathic pulmonary fibrosis) ([place]) 12/07/2020   Foot pain, right 12/07/2020   Cough 11/01/2020   Abnormal chest x-ray 10/31/2020   Diabetes mellitus type 2 with complications ([place]) 12/30/3005   Primary osteoarthritis involving multiple joints 02/17/2019   Hyperlipidemia associated with type 2 diabetes mellitus ([place]) 10/20/2009   GERD 03/25/2008   Back pain 10/03/2007   Hypothyroidism 02/05/2007   B12 deficiency 02/05/2007   Vitamin D deficiency 02/05/2007   Essential hypertension, benign 02/05/2007    Immunization History  Administered Date(s) Administered   Fluad Quad(high Dose 65+) 12/12/2018   Influenza Whole 01/31/2009, 02/28/2012   Influenza, High Dose Seasonal PF 01/06/2018, 12/12/2018   Influenza,inj,Quad PF,6+ Mos 01/11/2014   Influenza-Unspecified 01/29/2013, 12/22/2015, 12/08/2016, 01/09/2020   Moderna Sars-Covid-2 Vaccination 06/17/2019, 07/15/2019   Pneumococcal Conjugate-13 12/14/2014   Pneumococcal Polysaccharide-23 05/08/2012   Tetanus 05/08/2012    Conditions to be addressed/monitored:  Hypertension, Hyperlipidemia, Diabetes, Chronic Kidney Disease and Allergic Rhinitis  Care Plan : CCM Care Plan  Updates made by [name], RPH since 12/15/2020 12:00 AM     Problem: HTN, HLD, DM2, Hypothyroidism, GERD, Allergic Rhinitis   Priority: High  Onset Date: 09/14/2020     Long-Range Goal: Disease Management   Start Date: 09/14/2020  Expected End Date: 12/15/2020  This Visit's Progress: On track  Recent Progress: On track  Priority: High  Note:   Current Barriers:  Unable to independently monitor therapeutic efficacy  Pharmacist Clinical Goal(s):  Patient will verbalize ability to afford treatment regimen achieve adherence to  monitoring guidelines and medication adherence to achieve therapeutic efficacy maintain control of Blood pressure and Blood sugars  as evidenced by blood pressure logs and A1c with next labs   through collaboration with PharmD and provider.   Interventions: 1:1 collaboration with [name], MD regarding development and update of comprehensive plan of care as evidenced by provider attestation and co-signature Inter-disciplinary care team collaboration (see longitudinal plan of care) Comprehensive medication review performed; medication list updated in electronic medical record  Hypertension (BP goal <140/90) -Controlled -Current treatment: Carvedilol 6.27m twice daily  Losartan 1011mdaily  Amlodipine 102maily  -Medications previously tried: atenolol, nebivolol, lisinopril, HCTZ, furosemide  -Current home readings: n/a - slightly elevated with latest office visits, likely due to pain from fracture in her foot BP Readings from Last 3 Encounters:  12/07/20 (!) 146/80  12/06/20 (!) 144/70  11/01/20 140/80  -Current dietary habits: does not salt her food, uses only sodium reduced foods, drinks decaf coffee if any at all -Current exercise habits: limited due to pain in her hips/legs -Denies hypotensive/hypertensive symptoms -Educated on BP goals and benefits of medications for prevention of heart attack, stroke and kidney damage; Daily salt intake goal < 2300 mg; Importance of home blood pressure monitoring; -Counseled to monitor BP at home 1-2 times weekly, document, and provide log at future appointments -Counseled on diet and exercise extensively Recommended to continue current medication  Hyperlipidemia: (LDL goal < 70) -Not ideally controlled - improving  - Last LDL 103 mg/dL - (12/08/2020) -Current treatment: Pravastatin 41m40mily  ASA 81mg59mly  -Medications previously tried: Tricor, lovaza, lovastatin    -Current dietary patterns: reports that she does eat bacon  for breakfast on occasion, but does not eat much fried/ fatty foods aside from that  -Current exercise habits: limited due to pain in hips/legs  -Educated on Cholesterol goals;  Benefits of statin for ASCVD  risk reduction; Importance of limiting foods high in cholesterol; -Counseled on diet and exercise extensively Recommended to continue current medication  Diabetes (A1c goal <7%) -Controlled  - Last A1c 6.2% - (12/08/2020) - Last GFR 42.39 mL/min (12/08/2020) -Current medications: Metformin XR 500mg - 1 tablet daily  -Medications previously tried: n/a  -Denies hypoglycemic/hyperglycemic symptoms -Current meal patterns:  breakfast: fruit, toast, eggs, bacon  lunch: salad / sandwich   dinner: potatoes, chicken, vegetables  snacks: peanutbutter and crackers / popcorn drinks: water, sugar free powerade, diet cheerwine  -Current exercise: limited due to pain/ cramps in her legs  -Educated on A1c and blood sugar goals; Complications of diabetes including kidney damage, retinal damage, and cardiovascular disease; Benefits of weight loss; common side effect of metformin being diarrhea, will plan to switch to metformin XR to prevent bowel issues  -Counseled to check feet daily and get yearly eye exams -Recommended for patient to continue current medications  GERD (Goal: acid control / prevention of reflux ) -Controlled -Current treatment  Famotidine 40mg daily  Pantoprazole 40mg every other day x 2 weeks then stop -Medications previously tried: ranitidine   -Recommended for patient to continue wean of pantoprazole and trial famotidine 40mg daily for control   Hypothyroidism (Goal: Maintenance of euthyroid levels) -Controlled  Lab Results  Component Value Date   TSH 3.02 12/08/2020  -Current treatment  Levothyroxine 50mcg daily  -Medications previously tried: synthroid  -Recommended to continue current medication   Health Maintenance -Current therapy:  Calcium Carbonate-Vit D3  - 1200 units - 1 tablet daily  Vit D3  - 1000 units daily  Vit B-12 1000mcg daily  Acetaminophen 500mg - 1 tablet every 6 hours as needed  -Educated on Cost vs benefit of each product must be carefully weighed by individual consumer -Patient is satisfied with current therapy and denies issues -Recommended to continue current medication  Patient Goals/Self-Care Activities Patient will:  - take medications as prescribed check blood pressure 1-2 time weekly , document, and provide at future appointments  Follow Up Plan: Telephone follow up appointment with care management team member scheduled for: 6 weeks  The patient has been provided with contact information for the care management team and has been advised to call with any health related questions or concerns.  Next PCP appointment scheduled for: 8 weeks     Medication Assistance: None required.  Patient affirms current coverage meets needs.  Patient's preferred pharmacy is:  [facility] - [place], [place] - [address]  [place] [address] Phone: [phone] Fax: [phone]   Uses pill box? No - feels she can manage without  Pt endorses 100% compliance  Care Plan and Follow Up Patient Decision:  Patient agrees to Care Plan and Follow-up.  Plan: Telephone follow up appointment with care management team member scheduled for:  6 weeks, The patient has been provided with contact information for the care management team and has been advised to call with any health related questions or concerns.  and   Daniel C Szabat, PharmD Clinical Pharmacist, [facility]  

## 2020-12-15 NOTE — Patient Instructions (Addendum)
Good to see you  Must be in post-op shoe when weight baring  See me again in 4 weeks

## 2020-12-15 NOTE — Patient Instructions (Signed)
Visit Information  PATIENT GOALS:  Goals Addressed             This Visit's Progress    Track and Manage My Blood Pressure-Hypertension   On track    Timeframe:  Long-Range Goal Priority:  High Start Date:  09/14/2020                           Expected End Date:  06/14/2021                     Follow Up Date 03/16/2021   - check blood pressure weekly - record blood pressures and present with next appointment     Why is this important?   You won't feel high blood pressure, but it can still hurt your blood vessels.  High blood pressure can cause heart or kidney problems. It can also cause a stroke.  Making lifestyle changes like losing a little weight or eating less salt will help.  Checking your blood pressure at home and at different times of the day can help to control blood pressure.  If the doctor prescribes medicine remember to take it the way the doctor ordered.  Call the office if you cannot afford the medicine or if there are questions about it.     Notes: Patient will purchase blood pressure cuff at pharmacy to be able to start recording blood pressure         The patient verbalized understanding of instructions, educational materials, and care plan provided today and declined offer to receive copy of patient instructions, educational materials, and care plan.   Telephone follow up appointment with care management team member scheduled for: The patient has been provided with contact information for the care management team and has been advised to call with any health related questions or concerns.   Ellin Saba, PharmD Clinical Pharmacist, [facility]

## 2020-12-15 NOTE — Progress Notes (Signed)
Kayla Craig D.[name] [address] [place] [address] Phone: [phone]   Assessment and Plan:     1. Closed nondisplaced fracture of fifth metatarsal bone of right foot, subsequent encounter -Acute, follow-up sports medicine visit - New x-ray at today's visit.  My interpretation: Minimally displaced oblique fracture of fifth metatarsal.  Question mild worsening of displacement versus different angulation of x-ray image.   - As patient is pain-free with ambulation, continue Use of postop shoe when ambulatory AT ALL TIMES.  Weightbearing as tolerated.  - Follow-up in 4 weeks or sooner if pain worsens - Use Tylenol as needed for pain control    All pertinent previous records reviewed prior to and during visit.    Follow Up: 4 weeks for reevaluation.  Sooner if any worsening of pain or new MOI    Subjective:    Chief Complaint: Left foot pain follow up   HPI: 85 year old female c/o R foot pain due to fx of 5th MT.  12/15/20 Patient states that she has not had any other falls but the pain is a 9/10 and very sore. Still wearing the post-op shoe when outside of her house, but says that she has not been using her postop shoe while inside her house.  Denies pain with ambulation   12/08/2020 Patient states that she was sitting outside when she stood up walking across the grass, tripped and fell.  Was able to ambulate after fall with right foot tenderness.  Was evaluated yesterday and had x-rays done.  York Spaniel that she had a call this morning telling her she had a foot fracture and told to come into clinic.  Has been using Tylenol for pain relief.  Denies additional trauma, falls.  Denies numbness/tingling just weakness in extremities.  Patient presents today wearing crocs.  Mild discomfort with weightbearing.  Relevant Historical Information: DM type II, vitamin D deficiency  Additional pertinent review of systems negative.   Current  Outpatient Medications:    acetaminophen (TYLENOL) 500 MG tablet, Take 500 mg by mouth every 6 (six) hours as needed., Disp: , Rfl:    amLODipine (NORVASC) 10 MG tablet, Take 1 tablet by mouth once daily, Disp: 90 tablet, Rfl: 0   aspirin 81 MG EC tablet, Take 81 mg by mouth daily., Disp: , Rfl:    Calcium Carbonate-Vit D-Min (CALCIUM 1200 PO), Take by mouth., Disp: , Rfl:    carvedilol (COREG) 6.25 MG tablet, Take 1 tablet by mouth twice daily, Disp: 180 tablet, Rfl: 0   Cholecalciferol (VITAMIN D-3) 1000 UNITS CAPS, Take 1 capsule by mouth daily., Disp: , Rfl:    famotidine (PEPCID) 40 MG tablet, Take 1 tablet (40 mg total) by mouth daily., Disp: 90 tablet, Rfl: 1   levothyroxine (SYNTHROID) 50 MCG tablet, Take 50 mcg by mouth daily before breakfast., Disp: , Rfl:    losartan (COZAAR) 100 MG tablet, Take 1 tablet by mouth once daily, Disp: 90 tablet, Rfl: 0   metFORMIN (GLUCOPHAGE XR) 500 MG 24 hr tablet, Take 1 tablet (500 mg total) by mouth daily with breakfast., Disp: 90 tablet, Rfl: 3   pantoprazole (PROTONIX) 40 MG tablet, Take 1 tablet (40 mg total) by mouth daily. (Patient taking differently: Take 40 mg by mouth every other day. For 2 weeks then stop), Disp: 90 tablet, Rfl: 3   pravastatin (PRAVACHOL) 20 MG tablet, Take 1 tablet by mouth once daily, Disp: 90 tablet, Rfl: 0   vitamin  B-12 (CYANOCOBALAMIN) 1000 MCG tablet, Take 1,000 mcg by mouth daily., Disp: , Rfl:    Objective:     Vitals:   12/15/20 1428  BP: 138/70  Pulse: 70  SpO2: 98%  Weight: 161 lb (73 kg)  Height: 5\' 4"  (1.626 m)      Body mass index is 27.64 kg/m.    Physical Exam:    Gen: Appears well, nad, nontoxic and pleasant Psych: Alert and oriented, appropriate mood and affect Neuro: sensation intact, strength is 5/5 with df/pf/inv/ev, muscle tone wnl Skin: no susupicious lesions or rashes   Right foot and ankle: no deformity, no swelling or effusion Ecchymosis improved over base of third and fourth  toe TTP fifth metatarsal shaft  NTTP over fibular head, lat mal, medial mal, achilles, navicular, ATFL, CFL, deltoid, calcaneous or midfoot ROM DF 30, PF 45, inv/ev intact Negative ant drawer, talar tilt, rotation test, squeeze test. Neg thompson No pain with resisted inversion or eversion   Normal gait.   Electronically signed by:  D.[facility] 2:58 PM 12/15/20

## 2020-12-20 ENCOUNTER — Ambulatory Visit: Payer: Self-pay | Admitting: Pulmonary Disease

## 2020-12-20 DIAGNOSIS — J849 Interstitial pulmonary disease, unspecified: Secondary | ICD-10-CM | POA: Diagnosis not present

## 2020-12-23 LAB — MYOMARKER 3 PLUS PROFILE (RDL)
Anti-EJ Ab (RDL): NEGATIVE
Anti-Jo-1 Ab (RDL): <20 Units (ref <20)
Anti-Ku Ab (RDL): NEGATIVE
Anti-MDA-5 Ab (CADM-140)(RDL): <20 Units (ref <20)
Anti-Mi-2 Ab (RDL): NEGATIVE
Anti-NXP-2 (P140) Ab (RDL): <20 Units (ref <20)
Anti-OJ Ab (RDL): NEGATIVE
Anti-PL-12 Ab (RDL: NEGATIVE
Anti-PL-7 Ab (RDL): POSITIVE — AB
Anti-PM/Scl-100 Ab (RDL): <20 Units (ref <20)
Anti-SAE1 Ab, IgG (RDL): <20 Units (ref <20)
Anti-SRP Ab (RDL): NEGATIVE
Anti-SS-A 52kD Ab, IgG (RDL): <20 Units (ref <20)
Anti-TIF-1gamma Ab (RDL): <20 Units (ref <20)
Anti-U1 RNP Ab (RDL): <20 Units (ref <20)
Anti-U2 RNP Ab (RDL): NEGATIVE
Anti-U3 RNP (Fibrillarin)(RDL): NEGATIVE

## 2020-12-23 NOTE — Addendum Note (Signed)
Addended by: Jacquiline Doe on: 12/23/2020 04:13 PM   Modules accepted: Orders

## 2020-12-27 NOTE — Progress Notes (Signed)
   Interstitial Lung Disease Multidisciplinary Conference   Kayla Craig    MRN [id number]    DOB 03/22/1936  Primary Care Physician:Burns, Bobette Mo, MD  Referring Physician: Chilton Greathouse MD  Time of Conference: 7.30am- 8.30am Date of conference: 12/20/2020 Location of Conference: -  Virtual  Participating Pulmonary: Dr. Kalman Shan, MD,  Dr Chilton Greathouse, MD Pathology: Dr Holley Bouche, MD Radiology: Dr Cleone Slim MD Others:   Brief History:  85 Y/O with dyspnea, pulmonary fibrosis. CT 11/10/2020 read by non chest radiologist. Request to read CT and determine pattern at [facility]  Serology:  Pending  MDD discussion of CT scan   CT chest 11/10/2020 Pattern of patchy subpleural reticulation with moderate traction bronchiectasis, moderate honeycombing with basilar predominance  UIP pattern by ATS criteria  There appears to be an area of nodular fibrosis at the left lung base which may be part of the fibrotic process but cannot exclude malignancy.  Recommendation made for 10-month follow-up scan  Pathology discussion of biopsy:  PFTs:  Pending  MDD Impression/Recs:  CT scan shows UIP pattern fibrosis.  This could be IPF in the right clinical setting  Recommendation remains for 56-month follow-up scan to monitor area of nodularity at the left lung base.  Time Spent in preparation and discussion:  > 30 min    SIGNATURE   Chilton Greathouse MD [facility] 12/27/2020, 10:28 AM  .

## 2021-01-06 DIAGNOSIS — I10 Essential (primary) hypertension: Secondary | ICD-10-CM | POA: Diagnosis not present

## 2021-01-06 DIAGNOSIS — E1169 Type 2 diabetes mellitus with other specified complication: Secondary | ICD-10-CM | POA: Diagnosis not present

## 2021-01-06 DIAGNOSIS — E785 Hyperlipidemia, unspecified: Secondary | ICD-10-CM

## 2021-01-06 DIAGNOSIS — E039 Hypothyroidism, unspecified: Secondary | ICD-10-CM

## 2021-01-06 DIAGNOSIS — E118 Type 2 diabetes mellitus with unspecified complications: Secondary | ICD-10-CM

## 2021-01-09 ENCOUNTER — Other Ambulatory Visit: Payer: Self-pay | Admitting: Internal Medicine

## 2021-01-11 ENCOUNTER — Telehealth: Payer: Self-pay | Admitting: Internal Medicine

## 2021-01-11 MED ORDER — LEVOTHYROXINE SODIUM 50 MCG PO TABS: 50.0000 ug | ORAL_TABLET | Freq: Every day | ORAL | 1 refills | Status: DC

## 2021-01-11 NOTE — Telephone Encounter (Signed)
1.Medication Requested: metFORMIN (GLUCOPHAGE XR) 500 MG 24 hr tablet  2. Pharmacy (Name, Street, [place]): [facility] [address] [place], [place] - [address]  Phone:  [phone] Fax:  [phone]   3. On Med List: yes  4. Last Visit with PCP: 08.31.22  5. Next visit date with PCP: 11.28.22   Agent: Please be advised that RX refills may take up to 3 business days. We ask that you follow-up with your pharmacy.

## 2021-01-11 NOTE — Telephone Encounter (Signed)
Spoke with patient. Medication not refilled since she still had refills remaining on script.

## 2021-01-11 NOTE — Telephone Encounter (Signed)
Pharmacy calling in  Has changed manufacturer for patient medication levothyroxine (SYNTHROID) 50 MCG tablet  Wants to get approval to dispense new manufacturer to patient

## 2021-01-12 ENCOUNTER — Ambulatory Visit: Payer: HMO | Admitting: Sports Medicine

## 2021-01-12 NOTE — Progress Notes (Deleted)
    Kayla Craig D.[facility] [address] [place] [address] Phone: [phone]   Assessment and Plan:     There are no diagnoses linked to this encounter.  ***   Pertinent previous records reviewed include ***   Follow Up: ***     Subjective:   I, Kayla Craig, am serving as a scribe for Dr. Richardean Craig  Chief Complaint: Left foot pain follow up   HPI:   12/08/2020 Patient states that she was sitting outside when she stood up walking across the grass, tripped and fell.  Was able to ambulate after fall with right foot tenderness.  Was evaluated yesterday and had x-rays done.  York Spaniel that she had a call this morning telling her she had a foot fracture and told to come into clinic.  Has been using Tylenol for pain relief.  Denies additional trauma, falls.  Denies numbness/tingling just weakness in extremities.  Patient presents today wearing crocs.  Mild discomfort with weightbearing.  12/15/20 Patient states that she has not had any other falls but the pain is a 9/10 and very sore. Still wearing the post-op shoe when outside of her house, but says that she has not been using her postop shoe while inside her house.  Denies pain with ambulation   01/12/21 Patient states   Relevant Historical Information: ***  Additional pertinent review of systems negative.   Current Outpatient Medications:    acetaminophen (TYLENOL) 500 MG tablet, Take 500 mg by mouth every 6 (six) hours as needed., Disp: , Rfl:    amLODipine (NORVASC) 10 MG tablet, Take 1 tablet by mouth once daily, Disp: 90 tablet, Rfl: 0   aspirin 81 MG EC tablet, Take 81 mg by mouth daily., Disp: , Rfl:    Calcium Carbonate-Vit D-Min (CALCIUM 1200 PO), Take by mouth., Disp: , Rfl:    carvedilol (COREG) 6.25 MG tablet, Take 1 tablet by mouth twice daily, Disp: 180 tablet, Rfl: 0   Cholecalciferol (VITAMIN D-3) 1000 UNITS CAPS, Take 1 capsule by mouth daily., Disp: , Rfl:     famotidine (PEPCID) 40 MG tablet, Take 1 tablet (40 mg total) by mouth daily., Disp: 90 tablet, Rfl: 1   levothyroxine (SYNTHROID) 50 MCG tablet, Take 1 tablet (50 mcg total) by mouth daily before breakfast., Disp: 90 tablet, Rfl: 1   losartan (COZAAR) 100 MG tablet, Take 1 tablet by mouth once daily, Disp: 90 tablet, Rfl: 0   metFORMIN (GLUCOPHAGE XR) 500 MG 24 hr tablet, Take 1 tablet (500 mg total) by mouth daily with breakfast., Disp: 90 tablet, Rfl: 3   pantoprazole (PROTONIX) 40 MG tablet, Take 1 tablet (40 mg total) by mouth daily. (Patient taking differently: Take 40 mg by mouth every other day. For 2 weeks then stop), Disp: 90 tablet, Rfl: 3   pravastatin (PRAVACHOL) 20 MG tablet, Take 1 tablet by mouth once daily, Disp: 90 tablet, Rfl: 0   vitamin B-12 (CYANOCOBALAMIN) 1000 MCG tablet, Take 1,000 mcg by mouth daily., Disp: , Rfl:    Objective:     There were no vitals filed for this visit.    There is no height or weight on file to calculate BMI.    Physical Exam:    ***   Electronically signed by:  Kayla Craig D.[facility] 9:00 AM 01/12/21

## 2021-01-13 ENCOUNTER — Other Ambulatory Visit: Payer: Self-pay

## 2021-01-13 ENCOUNTER — Ambulatory Visit: Payer: HMO | Admitting: Sports Medicine

## 2021-01-13 VITALS — BP 142/80 | HR 73 | Ht 64.0 in | Wt 161.0 lb

## 2021-01-13 DIAGNOSIS — S92354D Nondisplaced fracture of fifth metatarsal bone, right foot, subsequent encounter for fracture with routine healing: Secondary | ICD-10-CM

## 2021-01-13 NOTE — Progress Notes (Signed)
Kayla Craig D.[name] [address] [place] [address] Phone: [phone]   Assessment and Plan:     1. Closed nondisplaced fracture of fifth metatarsal bone of right foot with normal healing, subsequent encounter -Subacute, resolved, subsequent visit - Patient has full strength, no tenderness to palpation, and no gross deformities - Fully cleared to restart all activities   Pertinent previous records reviewed include previous office visit   Follow Up: As needed if no improvement or worsening of symptoms.  May also follow-up for acute on chronic knee pain to discuss CSI versus HA injections   Subjective:   I, Kayla Craig, am serving as a scribe for Dr. Richardean Sale  Chief Complaint: Follow up of right foot pain due to fx of 5th MT  HPI:   12/15/20 Patient states that she has not had any other falls but the pain is a 9/10 and very sore. Still wearing the post-op shoe when outside of her house, but says that she has not been using her postop shoe while inside her house.  Denies pain with ambulation     12/08/2020 Patient states that she was sitting outside when she stood up walking across the grass, tripped and fell.  Was able to ambulate after fall with right foot tenderness.  Was evaluated yesterday and had x-rays done.  York Spaniel that she had a call this morning telling her she had a foot fracture and told to come into clinic.  Has been using Tylenol for pain relief.  Denies additional trauma, falls.  Denies numbness/tingling just weakness in extremities.  Patient presents today wearing crocs.  Mild discomfort with weightbearing.  01/13/21 Patient states that she is doing great that the foot is not sore anymore. Patient has been wearing her post op shoe all the time. Patient I ready to be out of the shoe.   Relevant Historical Information: DM type II, vitamin D deficiency  Additional pertinent review of systems  negative.   Current Outpatient Medications:    acetaminophen (TYLENOL) 500 MG tablet, Take 500 mg by mouth every 6 (six) hours as needed., Disp: , Rfl:    amLODipine (NORVASC) 10 MG tablet, Take 1 tablet by mouth once daily, Disp: 90 tablet, Rfl: 0   aspirin 81 MG EC tablet, Take 81 mg by mouth daily., Disp: , Rfl:    Calcium Carbonate-Vit D-Min (CALCIUM 1200 PO), Take by mouth., Disp: , Rfl:    carvedilol (COREG) 6.25 MG tablet, Take 1 tablet by mouth twice daily, Disp: 180 tablet, Rfl: 0   Cholecalciferol (VITAMIN D-3) 1000 UNITS CAPS, Take 1 capsule by mouth daily., Disp: , Rfl:    famotidine (PEPCID) 40 MG tablet, Take 1 tablet (40 mg total) by mouth daily., Disp: 90 tablet, Rfl: 1   levothyroxine (SYNTHROID) 50 MCG tablet, Take 1 tablet (50 mcg total) by mouth daily before breakfast., Disp: 90 tablet, Rfl: 1   losartan (COZAAR) 100 MG tablet, Take 1 tablet by mouth once daily, Disp: 90 tablet, Rfl: 0   metFORMIN (GLUCOPHAGE XR) 500 MG 24 hr tablet, Take 1 tablet (500 mg total) by mouth daily with breakfast., Disp: 90 tablet, Rfl: 3   pantoprazole (PROTONIX) 40 MG tablet, Take 1 tablet (40 mg total) by mouth daily. (Patient taking differently: Take 40 mg by mouth every other day. For 2 weeks then stop), Disp: 90 tablet, Rfl: 3   pravastatin (PRAVACHOL) 20 MG tablet, Take 1 tablet by mouth once daily, Disp: 90  tablet, Rfl: 0   vitamin B-12 (CYANOCOBALAMIN) 1000 MCG tablet, Take 1,000 mcg by mouth daily., Disp: , Rfl:    Objective:     Vitals:   01/13/21 1141  BP: (!) 142/80  Pulse: 73  SpO2: 97%  Weight: 161 lb (73 kg)  Height: 5\' 4"  (1.626 m)      Body mass index is 27.64 kg/m.    Physical Exam:    Gen: Appears well, nad, nontoxic and pleasant Psych: Alert and oriented, appropriate mood and affect Neuro: sensation intact, strength is 5/5 with df/pf/inv/ev, muscle tone wnl Skin: no susupicious lesions or rashes   Right foot and ankle: no deformity, no swelling or  effusion Ecchymosis resolved over base of third and fourth toe NTTP fifth metatarsal shaft  NTTP over fibular head, lat mal, medial mal, achilles, navicular, ATFL, CFL, deltoid, calcaneous or midfoot ROM DF 30, PF 45, inv/ev intact Negative ant drawer, talar tilt, rotation test, squeeze test. Neg thompson No pain with resisted inversion or eversion   Normal gait.   Electronically signed by:  D.[facility] 11:54 AM 01/13/21

## 2021-01-13 NOTE — Patient Instructions (Addendum)
Good to see you  Knee exercises given  See me again as needed

## 2021-01-27 ENCOUNTER — Ambulatory Visit: Payer: HMO | Admitting: Pulmonary Disease

## 2021-01-27 ENCOUNTER — Encounter: Payer: Self-pay | Admitting: Pulmonary Disease

## 2021-01-27 ENCOUNTER — Ambulatory Visit (INDEPENDENT_AMBULATORY_CARE_PROVIDER_SITE_OTHER): Payer: HMO | Admitting: Pulmonary Disease

## 2021-01-27 ENCOUNTER — Other Ambulatory Visit: Payer: Self-pay

## 2021-01-27 VITALS — BP 132/64 | HR 77 | Temp 97.9°F | Ht 64.0 in | Wt 160.0 lb

## 2021-01-27 DIAGNOSIS — J849 Interstitial pulmonary disease, unspecified: Secondary | ICD-10-CM

## 2021-01-27 DIAGNOSIS — R768 Other specified abnormal immunological findings in serum: Secondary | ICD-10-CM | POA: Diagnosis not present

## 2021-01-27 LAB — PULMONARY FUNCTION TEST
DL/VA % pred: 82 %
DL/VA: 3.33 ml/min/mmHg/L
DLCO unc % pred: 54 %
DLCO unc: 10.13 ml/min/mmHg
FEF 25-75 Post: 1.48 L/sec
FEF 25-75 Pre: 1.99 L/sec
FEF2575-%Change-Post: -25 %
FEF2575-%Pred-Post: 118 %
FEF2575-%Pred-Pre: 158 %
FEV1-%Change-Post: -8 %
FEV1-%Pred-Post: 75 %
FEV1-%Pred-Pre: 82 %
FEV1-Post: 1.38 L
FEV1-Pre: 1.52 L
FEV1FVC-%Change-Post: 1 %
FEV1FVC-%Pred-Pre: 119 %
FEV6-%Change-Post: -10 %
FEV6-%Pred-Post: 66 %
FEV6-%Pred-Pre: 74 %
FEV6-Post: 1.55 L
FEV6-Pre: 1.73 L
FEV6FVC-%Pred-Post: 106 %
FEV6FVC-%Pred-Pre: 106 %
FVC-%Change-Post: -10 %
FVC-%Pred-Post: 62 %
FVC-%Pred-Pre: 69 %
FVC-Post: 1.55 L
FVC-Pre: 1.73 L
Post FEV1/FVC ratio: 89 %
Post FEV6/FVC ratio: 100 %
Pre FEV1/FVC ratio: 88 %
Pre FEV6/FVC Ratio: 100 %
RV % pred: 45 %
RV: 1.12 L
TLC % pred: 59 %
TLC: 2.99 L

## 2021-01-27 NOTE — Patient Instructions (Signed)
We will start paperwork for a medication called Esbriet We will also follow on rheumatology evaluation Will order high-resolution CT in 1 month Follow-up in clinic in 1 month

## 2021-01-27 NOTE — Patient Instructions (Signed)
Full PFT performed today. °

## 2021-01-27 NOTE — Progress Notes (Signed)
Kayla Craig    [id number]    1935-11-06  Primary Care Physician:Burns, Bobette Mo, MD  Referring Physician: Pincus Sanes, MD [address] [place],  [place] [address]  Chief complaint: Consult for interstitial lung disease  HPI: 85 year old with history of hypertension, diabetes, hyperlipidemia, allergies, GERD on PPI, peripheral artery disease She has a dry nonproductive cough since March 2022.  No associated dyspnea fevers or chills.  Given Z-Pak by primary care in July 2022 Chest x-ray and follow-up CT showed interstitial lung disease and she has been referred to pulmonary for further evaluation  Has occasional joint pain.  No morning stiffness, difficulty swallowing, denies dry mouth, dry eyes  Pets: No pets Occupation: Used to work in a Transport planner Exposures: Was exposed to acetone while working.  No ongoing exposures.  No mold, hot tub, Jacuzzi.  No feather pillows or comforters Smoking history: Never smoker Travel history: No significant travel history Relevant family history: No family history of lung disease   Outpatient Encounter Medications as of 01/27/2021  Medication Sig   acetaminophen (TYLENOL) 500 MG tablet Take 500 mg by mouth every 6 (six) hours as needed.   amLODipine (NORVASC) 10 MG tablet Take 1 tablet by mouth once daily   aspirin 81 MG EC tablet Take 81 mg by mouth daily.   Calcium Carbonate-Vit D-Min (CALCIUM 1200 PO) Take by mouth.   carvedilol (COREG) 6.25 MG tablet Take 1 tablet by mouth twice daily   Cholecalciferol (VITAMIN D-3) 1000 UNITS CAPS Take 1 capsule by mouth daily.   famotidine (PEPCID) 40 MG tablet Take 1 tablet (40 mg total) by mouth daily.   levothyroxine (SYNTHROID) 50 MCG tablet Take 1 tablet (50 mcg total) by mouth daily before breakfast.   losartan (COZAAR) 100 MG tablet Take 1 tablet by mouth once daily   metFORMIN (GLUCOPHAGE XR) 500 MG 24 hr tablet Take 1 tablet (500 mg total) by mouth daily with breakfast.    pravastatin (PRAVACHOL) 20 MG tablet Take 1 tablet by mouth once daily   vitamin B-12 (CYANOCOBALAMIN) 1000 MCG tablet Take 1,000 mcg by mouth daily.   [DISCONTINUED] pantoprazole (PROTONIX) 40 MG tablet Take 1 tablet (40 mg total) by mouth daily. (Patient taking differently: Take 40 mg by mouth every other day. For 2 weeks then stop)   No facility-administered encounter medications on file as of 01/27/2021.    Allergies as of 01/27/2021 - Review Complete 01/27/2021  Allergen Reaction Noted   Codeine Nausea And Vomiting     Past Medical History:  Diagnosis Date   B12 deficiency    Back pain    Chronic   Goiter    Grief    Grief and loss related depression '08   Hypertension    TMJ (dislocation of temporomandibular joint)    Right Side   Vitamin D deficiency     Past Surgical History:  Procedure Laterality Date   EYE SURGERY  2010   cataract surgery with implants   KNEE SURGERY     Right Arthroscopy '05 Dr Charlann Boxer   THYROIDECTOMY     August '09 for goiter (Dr Jearld Fenton)   TUBAL LIGATION      Family History  Problem Relation Age of Onset   Cancer Mother    Heart disease Father        CAD/MI   Heart disease Brother        CAD/MI   Heart disease Brother  CAD/MI   Cancer Brother    Diabetes Neg Hx     Social History   Socioeconomic History   Marital status: Single    Spouse name: Not on file   Number of children: 2   Years of education: 12   Highest education level: Not on file  Occupational History   Occupation: retired    Associate Professor: RETIRED  Tobacco Use   Smoking status: Never   Smokeless tobacco: Never  Vaping Use   Vaping Use: Never used  Substance and Sexual Activity   Alcohol use: No    Alcohol/week: 0.0 standard drinks   Drug use: Never   Sexual activity: Never  Other Topics Concern   Not on file  Social History Narrative   HSG. [place]. 2 sons- August 02, 2060 (died lung cancer 07-26-22), August 03, 2067; 3 grandchildren. 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 Lives alone: I-ADLS, lives in Sr Citizen's complex.. End of life issues: patient would want CPR, DNI, not to be maintained in a vegative state. Laymen's guide packet provided (july '11)   Social Determinants of Health   Financial Resource Strain: Low Risk    Difficulty of Paying Living Expenses: Not hard at all  Food Insecurity: No Food Insecurity   Worried About Programme researcher, broadcasting/film/video in the Last Year: Never true   Barista in the Last Year: Never true  Transportation Needs: No Transportation Needs   Lack of Transportation (Medical): No   Lack of Transportation (Non-Medical): No  Physical Activity: Not on file  Stress: Not on file  Social Connections: Not on file  Intimate Partner Violence: Not on file    Review of systems: Review of Systems  Constitutional: Negative for fever and chills.  HENT: Negative.   Eyes: Negative for blurred vision.  Respiratory: as per HPI  Cardiovascular: Negative for chest pain and palpitations.  Gastrointestinal: Negative for vomiting, diarrhea, blood per rectum. Genitourinary: Negative for dysuria, urgency, frequency and hematuria.  Musculoskeletal: Negative for myalgias, back pain and joint pain.  Skin: Negative for itching and rash.  Neurological: Negative for dizziness, tremors, focal weakness, seizures and loss of consciousness.  Endo/Heme/Allergies: Negative for environmental allergies.  Psychiatric/Behavioral: Negative for depression, suicidal ideas and hallucinations.  All other systems reviewed and are negative.  Physical Exam: Blood pressure (!) 144/70, pulse 89, height 5\' 4"  (1.626 m), weight 160 lb 6.4 oz (72.8 kg), SpO2 94 %. Gen:      No acute distress HEENT:  EOMI, sclera anicteric Neck:     No masses; no thyromegaly Lungs:    Bibasal crackles CV:         Regular rate and rhythm; no murmurs Abd:      + bowel sounds; soft, non-tender; no palpable masses, no distension Ext:    No edema;  adequate peripheral perfusion Skin:      Warm and dry; no rash Neuro: alert and oriented x 3 Psych: normal mood and affect  Data Reviewed: Imaging: CT chest 11/10/2020-peripheral reticulation, traction bronchiectasis with minimal honeycombing.  UIP pattern pulmonary fibrosis I have reviewed the images personally.  PFTs: 01/27/2021 FVC 1.55 (62%], FEV1 1.38 [95%], F/F 89, TLC 2.99 [59%], DLCO 10.13 [54%] Severe restriction, diffusion defect  Labs: CTD serologies-ANA 12/06/2020- 1:1280, nuclear speckled Myositis panel positive for PL 7  Assessment:  Interstitial lung disease, pulmonary fibrosis Has CT shows pattern of pulmonary fibrosis in UIP.  She does not have any exposures, signs and symptoms of connective tissue  disease though ANA noted to be very high  Differential diagnosis is IPF versus CTD ILD She is due to follow-up with rheumatology Irrespective of their rheumatologic diagnosis she is a candidate for antifibrotic given UIP pattern and we will start her on Esbriet.  Hepatic panel in September is normal  Enlarged pulmonary artery Echocardiogram ordered for evaluation of pulmonary hypertension  Lung nodule There appears to be an area of nodular fibrosis at the left lung base which may be part of the fibrotic process but cannot exclude malignancy.  Recommendation made at ILD conference for 77-month follow-up scan  Plan/Recommendations: Start paperwork for Esbriet Follow rheumatology consult  Chilton Greathouse MD [facility] 01/27/2021, 3:10 PM  CC: Pincus Sanes, MD

## 2021-01-27 NOTE — Progress Notes (Signed)
Full PFT performed today. °

## 2021-01-27 NOTE — Addendum Note (Signed)
Addended by: Wyvonne Lenz on: 01/27/2021 04:41 PM   Modules accepted: Orders

## 2021-01-28 NOTE — Progress Notes (Signed)
Office Visit Note  Patient: Kayla Craig             Date of Birth: Dec 25, 1935           MRN: [id number]             PCP: Pincus Sanes, MD Referring: Chilton Greathouse, MD Visit Date: 01/30/2021  Subjective:   History of Present Illness: Kayla Craig is a 85 y.o. female here for evaluation for ILD with positive ANA. Nonproductive coughing and some dyspnea with exertion were ongoing for months until she complained in her primary care clinic requesting retreatment with azithromycin.  She noticed a partial benefit to taking the antibiotics but continued with nonproductive coughing.  X-ray was abnormal and CT imaging was consistent with UIP pattern of disease.  This led for referral to pulmonology clinic for evaluation of new diagnosis ILD.  No specific CTD symptoms reported at pulmonlogy visit but work-up was considerably positive for ANA at 1:1280 concentration and low positive PL-7.  Her symptoms have not changed significantly since that time. Besides the cough and complaints she has multiple ongoing issues.  She has osteoarthritis in many joints significant bony nodules and deviations in her hands worst at the right index finger but these do not cause much pain.  She has chronic right knee enlargement and some stiffness previous arthroscopy of this joint years ago with some cartilage revision subsequently told she has advanced osteoarthritis related to this.  She has fallen about 5 times due to numerous events including being hit by a shopping cart, tripping over a tree root, and falling in the bathroom. She feels numbness and discomfort in her toes bilaterally mostly lying in bed at night. She has large bunions and right 2nd toe hammertoe deformity but was not able to have surgery due to poor circulation concerns reportedly. She has chronic dry eyes and mouth, treated with implants and eyedrops and oral biotene mouth rinse. She denies lymphadenopathy or salivary gland swelling. She has no new  skin rashes. She denies any abnormal bleeding or history of blood clots.   Labs reviewed 11/2020 ANA 1:1,280 speckled MyoMarker 3 PL-7 weak pos ANCA neg  Imaging reviewed 11/10/20 CT chest IMPRESSION: Peripheral subpleural interstitial lung disease changes with basilar predominance consistent with pulmonary fibrosis/usual  interstitial pneumonitis with scattered associated saccular bronchiectasis. Enlargement of central pulmonary arteries raising question of pulmonary arterial hypertension. No acute intrathoracic abnormalities. Scattered atherosclerotic calcifications including coronary arteries. Prior RIGHT thyroid lobectomy. Aortic Atherosclerosis (ICD10-I70.0).  Activities of Daily Living:  Patient reports morning stiffness for 2 hours.   Patient Reports nocturnal pain.  Difficulty dressing/grooming: Denies Difficulty climbing stairs: Denies Difficulty getting out of chair: Denies Difficulty using hands for taps, buttons, cutlery, and/or writing: Denies  Review of Systems  Constitutional:  Positive for fatigue.  HENT:  Positive for mouth dryness.   Eyes:  Positive for dryness.  Respiratory:  Positive for shortness of breath.   Cardiovascular:  Negative for swelling in legs/feet.  Gastrointestinal:  Negative for constipation.  Endocrine: Positive for increased urination.  Genitourinary:  Negative for difficulty urinating.  Musculoskeletal:  Positive for joint pain, joint pain, muscle weakness and morning stiffness.  Skin:  Negative for rash.  Allergic/Immunologic: Negative for susceptible to infections.  Neurological:  Positive for weakness.  Hematological:  Negative for bruising/bleeding tendency.  Psychiatric/Behavioral:  Negative for sleep disturbance.    PMFS History:  Patient Active Problem List   Diagnosis Date Noted   Positive ANA (antinuclear  antibody) 01/30/2021   IPF (idiopathic pulmonary fibrosis) (HCC) 12/07/2020   Foot pain, right 12/07/2020   Cough  11/01/2020   Abnormal chest x-ray 10/31/2020   Diabetes mellitus type 2 with complications (HCC) 02/26/2020   Primary osteoarthritis involving multiple joints 02/17/2019   Hyperlipidemia associated with type 2 diabetes mellitus (HCC) 10/20/2009   GERD 03/25/2008   Back pain 10/03/2007   Hypothyroidism 02/05/2007   B12 deficiency 02/05/2007   Vitamin D deficiency 02/05/2007   Essential hypertension, benign 02/05/2007    Past Medical History:  Diagnosis Date   B12 deficiency    Back pain    Chronic   Goiter    Grief    Grief and loss related depression '08   Hypertension    TMJ (dislocation of temporomandibular joint)    Right Side   Vitamin D deficiency     Family History  Problem Relation Age of Onset   Cancer Mother    Heart disease Father        CAD/MI   Heart disease Brother        CAD/MI   Heart disease Brother        CAD/MI   Cancer Brother    Diabetes Neg Hx    Past Surgical History:  Procedure Laterality Date   EYE SURGERY  2008/08/16   cataract surgery with implants   KNEE SURGERY     Right Arthroscopy '05 Dr Charlann Boxer   THYROIDECTOMY     August '09 for goiter (Dr Jearld Fenton)   TUBAL LIGATION     Social History   Social History Narrative   [facility]. [place]. 2 sons- 08-16-2060 (died lung cancer Aug 09, 2022), 17-Aug-2067; 3 grandchildren. 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 End of life issues: patient would want CPR, DNI, not to be maintained in a vegative state. Laymen's guide packet provided (july '11)   Immunization History  Administered Date(s) Administered   Fluad Quad(high Dose 65+) 12/12/2018   Influenza Whole 01/31/2009, 02/28/2012   Influenza, High Dose Seasonal PF 01/06/2018, 12/12/2018   Influenza,inj,Quad PF,6+ Mos 01/11/2014   Influenza-Unspecified 01/29/2013, 12/22/2015, 12/08/2016, 01/09/2020   Moderna Sars-Covid-2 Vaccination 06/17/2019, 08-09-19, 03/16/2020    Pneumococcal Conjugate-13 12/14/2014   Pneumococcal Polysaccharide-23 05/08/2012   Tetanus 05/08/2012     Objective: Vital Signs: BP (!) 152/69 (BP Location: Left Arm, Patient Position: Sitting, Cuff Size: Normal)   Pulse 71   Resp 16   Ht 5' 3.5" (1.613 m)   Wt 160 lb (72.6 kg)   BMI 27.90 kg/m    Physical Exam HENT:     Right Ear: External ear normal.     Left Ear: External ear normal.     Mouth/Throat:     Mouth: Mucous membranes are moist.     Pharynx: Oropharynx is clear.  Eyes:     Conjunctiva/sclera: Conjunctivae normal.  Cardiovascular:     Rate and Rhythm: Normal rate and regular rhythm.  Pulmonary:     Effort: Pulmonary effort is normal.     Comments: Bilateral inspiratory crackles in lung bases Musculoskeletal:     Right lower leg: No edema.     Left lower leg: No edema.  Skin:    General: Skin is warm and dry.     Comments: Multiple seborrheic keratoses and solar lentigines present, no erythematous rashes  Neurological:     Mental Status: She is alert.     Deep Tendon Reflexes: Reflexes normal.  Psychiatric:        Mood and Affect: Mood normal.     Musculoskeletal Exam:  Neck full ROM no tenderness Shoulders full ROM no tenderness or swelling Elbows full ROM no tenderness or swelling Wrists full ROM no tenderness or swelling Fingers extensive heberdon's nodes throughout, 1st CMC squaring, slightly decreased flexion ROM and deviation, no tenderness or synovitis Knees full ROM, right knee bony enlargement compared to right no palpable effusion Ankles full ROM no tenderness or swelling Advanced bilateral bunions without synovitis, right 2nd toe hammertoe, no skin break down, mild calluses on soles of feet no ulcers   Investigation: No additional findings.  Imaging: No results found.  Recent Labs: Lab Results  Component Value Date   WBC 8.4 12/08/2020   HGB 12.3 12/08/2020   PLT 259.0 12/08/2020   NA 136 12/08/2020   K 4.3 12/08/2020   CL 101  12/08/2020   CO2 26 12/08/2020   GLUCOSE 72 12/08/2020   BUN 22 12/08/2020   CREATININE 1.19 12/08/2020   BILITOT 0.6 12/08/2020   ALKPHOS 53 12/08/2020   AST 15 12/08/2020   ALT 12 12/08/2020   PROT 7.6 12/08/2020   ALBUMIN 4.3 12/08/2020   CALCIUM 9.6 12/08/2020   GFRAA >90 12/27/2012    Speciality Comments: No specialty comments available.  Procedures:  No procedures performed Allergies: Codeine   Assessment / Plan:     Visit Diagnoses: Positive ANA (antinuclear antibody)  Highly positive ANA but I cannot see any specific clinical criteria for active systemic CTD. She has dry eyes and mouth this could be consistent with sjogren syndrome but negative serology for this and does not have objective dry mouth changes or salivary gland changes. Toe pain appears more consistent with peripheral neuropathy from PVD or from her T2DM. No indication for systemic immunosuppression for an extrapulmonary process at this time.  IPF (idiopathic pulmonary fibrosis) (HCC)  New diagnosis this year, UIP pattern lung disease by CT scan with plan for repeat for interval progression monitoring. Moderately severe restrictive defect and moderately severe DLCO defect on PFTs.  Primary osteoarthritis involving multiple joints Foot pain, right  Significant osteoarthritis changes in bilateral hands but not severely symptomatic.  Also with a lot of disease in the right knee and bilateral feet. Her described numbness and night time pain seems more typical for neuropathy or vascular disease than the osteoarthritis. She has not had this specific problem thoroughly evaluated but is interested in avoid additional doctors and drugs for now if possible.  Orders: No orders of the defined types were placed in this encounter.  No orders of the defined types were placed in this encounter.    Follow-Up Instructions: No follow-ups on file.   Fuller Plan, MD  Note - This record has been created using  [employer].  Chart creation errors have been sought, but may not always  have been located. Such creation errors do not reflect on  the standard of medical care.

## 2021-01-30 ENCOUNTER — Ambulatory Visit: Payer: HMO | Admitting: Internal Medicine

## 2021-01-30 ENCOUNTER — Telehealth: Payer: Self-pay | Admitting: Internal Medicine

## 2021-01-30 ENCOUNTER — Encounter: Payer: Self-pay | Admitting: Internal Medicine

## 2021-01-30 ENCOUNTER — Other Ambulatory Visit: Payer: Self-pay

## 2021-01-30 VITALS — BP 152/69 | HR 71 | Resp 16 | Ht 63.5 in | Wt 160.0 lb

## 2021-01-30 DIAGNOSIS — J84112 Idiopathic pulmonary fibrosis: Secondary | ICD-10-CM

## 2021-01-30 DIAGNOSIS — M79671 Pain in right foot: Secondary | ICD-10-CM

## 2021-01-30 DIAGNOSIS — M159 Polyosteoarthritis, unspecified: Secondary | ICD-10-CM | POA: Diagnosis not present

## 2021-01-30 DIAGNOSIS — R768 Other specified abnormal immunological findings in serum: Secondary | ICD-10-CM | POA: Diagnosis not present

## 2021-01-30 NOTE — Telephone Encounter (Signed)
Patient calling to inform she will receive her flu shot at the pharamcy

## 2021-01-31 ENCOUNTER — Telehealth: Payer: Self-pay

## 2021-01-31 ENCOUNTER — Other Ambulatory Visit: Payer: Self-pay | Admitting: Cardiovascular Disease

## 2021-01-31 ENCOUNTER — Other Ambulatory Visit: Payer: Self-pay | Admitting: Internal Medicine

## 2021-01-31 NOTE — Telephone Encounter (Signed)
Received New start paperwork for ES[employer]RIET. Will update as we work through the benefits process.   Submitted a Prior Authorization request to [facility] for ES[employer]RIET via CoverMyMeds. Will update once we receive a response.  Key: [id number]

## 2021-01-31 NOTE — Telephone Encounter (Signed)
Submitted Patient Assistance Application to [employer] for [employer] along with provider portion, PA and income documents. Will update patient when we receive a response.  Fax# [phone] Phone# [phone] 

## 2021-02-02 ENCOUNTER — Ambulatory Visit (INDEPENDENT_AMBULATORY_CARE_PROVIDER_SITE_OTHER): Payer: HMO

## 2021-02-02 NOTE — Patient Instructions (Signed)
Kayla Craig,  It was great to talk to you today!  Please call me with any questions or concerns.  Visit Information  PATIENT GOALS:  Goals Addressed             This Visit's Progress    Track and Manage My Blood Pressure-Hypertension   On track    Timeframe:  Long-Range Goal Priority:  High Start Date:  09/14/2020                           Expected End Date:  06/14/2021                     Follow Up Date 03/16/2021   - check blood pressure weekly - record blood pressures and present with next appointment     Why is this important?   You won't feel high blood pressure, but it can still hurt your blood vessels.  High blood pressure can cause heart or kidney problems. It can also cause a stroke.  Making lifestyle changes like losing a little weight or eating less salt will help.  Checking your blood pressure at home and at different times of the day can help to control blood pressure.  If the doctor prescribes medicine remember to take it the way the doctor ordered.  Call the office if you cannot afford the medicine or if there are questions about it.     Notes: Patient will purchase blood pressure cuff at pharmacy to be able to start recording blood pressure         The patient verbalized understanding of instructions, educational materials, and care plan provided today and declined offer to receive copy of patient instructions, educational materials, and care plan.   Telephone follow up appointment with care management team member scheduled for: 3 months The patient has been provided with contact information for the care management team and has been advised to call with any health related questions or concerns.   Ellin Saba, PharmD Clinical Pharmacist, [facility]

## 2021-02-02 NOTE — Progress Notes (Signed)
Chronic Care Management Pharmacy Note  02/02/2021 Name:  Kayla Craig MRN:  [id number] DOB:  [birth year]   Summary: -Patient following closely with pulmonology and rheumatology - found to have UIP pattern pulmonary fibrosis - started on Esbriet (PAP has been sent by pulmonology office 01/31/21 - awaiting determination) -Patient reports that she has been using famotidine for her acid reflux with success - no longer using pantoprazole -right foot has healed, no other issues or concerns at this time  Recommendations/Changes made from today's visit: -Recommending no changes to medications at this time, patient awaiting determination on Esbriet PAP before starting - likely if approved will be able to start medication in 3-4 weeks - patient to reach out with any medication issues prior to next appointment   Subjective: Kayla Craig is an 85 y.o. year old female who is a primary patient of Burns, Claudina Lick, MD.  The CCM team was consulted for assistance with disease management and care coordination needs.    Engaged with patient by telephone for follow up visit in response to provider referral for pharmacy case management and/or care coordination services.   Consent to Services:  The patient was given the following information about Chronic Care Management services today, agreed to services, and gave verbal consent: 1. CCM service includes personalized support from designated clinical staff supervised by the primary care provider, including individualized plan of care and coordination with other care providers 2. 24/7 contact phone numbers for assistance for urgent and routine care needs. 3. Service will only be billed when office clinical staff spend 20 minutes or more in a month to coordinate care. 4. Only one practitioner may furnish and bill the service in a calendar month. 5.The patient may stop CCM services at any time (effective at the end of the month) by phone call to the office staff. 6.  The patient will be responsible for cost sharing (co-pay) of up to 20% of the service fee (after annual deductible is met). Patient agreed to services and consent obtained.  Patient Care Team: Binnie Rail, MD as PCP - General (Internal Medicine) Calvert Cantor, MD (Ophthalmology) Paralee Cancel, MD (Orthopedic Surgery) [name], [facility] as Pharmacist (Pharmacist)  Recent office visits: None since last visit   Recent consult visits: 01/30/21 Dr. Benjamine Mola - Rheumatology - no changes to patiets medications at this time per patient request  01/27/21 Dr. Vaughan Browner - Pulmonology - UIP pattern pulmonary fibrosis - started on Esbriet - follow up with rheumatology  01/13/21 Dr. Glennon Mac - sports medicine - f/u for fracture of bone in right foot - resolved - patient has full strength of right foot, cleared to restart all activities  12/15/20 - Dr. Glennon Mac - sports medicine - no changes - patient to use postop show when ambulatory - follow up in 4 weeks   Hospital visits: None in previous 6 months  Objective:  Lab Results  Component Value Date   CREATININE 1.19 12/08/2020   BUN 22 12/08/2020   GFR 42.39 (L) 12/08/2020   GFRNONAA 83 (L) 12/27/2012   GFRAA >90 12/27/2012   NA 136 12/08/2020   K 4.3 12/08/2020   CALCIUM 9.6 12/08/2020   CO2 26 12/08/2020   GLUCOSE 72 12/08/2020    Lab Results  Component Value Date/Time   HGBA1C 6.2 12/08/2020 03:02 PM   HGBA1C 6.2 02/26/2020 02:17 PM   GFR 42.39 (L) 12/08/2020 03:02 PM   GFR 46.34 (L) 02/26/2020 02:17 PM   MICROALBUR 3.4 (H) 02/26/2020  02:17 PM    Last diabetic Eye exam:  No results found for: HMDIABEYEEXA  Last diabetic Foot exam:  No results found for: HMDIABFOOTEX   Lab Results  Component Value Date   CHOL 201 (H) 12/08/2020   HDL 43.90 12/08/2020   LDLDIRECT 103.0 12/08/2020   TRIG 352.0 (H) 12/08/2020   CHOLHDL 5 12/08/2020    Hepatic Function Latest Ref Rng & Units 12/08/2020 02/26/2020 02/17/2019  Total Protein 6.0 - 8.3  g/dL 7.6 8.0 7.7  Albumin 3.5 - 5.2 g/dL 4.3 4.3 4.3  AST 0 - 37 U/L 15 17 21   ALT 0 - 35 U/L 12 15 25   Alk Phosphatase 39 - 117 U/L 53 54 64  Total Bilirubin 0.2 - 1.2 mg/dL 0.6 0.6 0.5  Bilirubin, Direct 0.0 - 0.3 mg/dL - - -    Lab Results  Component Value Date/Time   TSH 3.02 12/08/2020 03:02 PM   TSH 2.55 02/17/2019 01:25 PM   FREET4 0.81 02/17/2019 01:25 PM   FREET4 0.75 02/12/2018 10:48 AM    CBC Latest Ref Rng & Units 12/08/2020 02/26/2020 02/17/2019  WBC 4.0 - 10.5 K/uL 8.4 8.3 7.5  Hemoglobin 12.0 - 15.0 g/dL 12.3 13.3 13.0  Hematocrit 36.0 - 46.0 % 37.2 39.9 38.3  Platelets 150.0 - 400.0 K/uL 259.0 272.0 250.0    Lab Results  Component Value Date/Time   VD25OH 33.46 12/08/2020 03:02 PM   VD25OH 23.53 (L) 02/17/2019 01:25 PM    Clinical ASCVD: No  The ASCVD Risk score (Arnett DK, et al., 2019) failed to calculate for the following reasons:   The 2019 ASCVD risk score is only valid for ages 68 to 52    Depression screen PHQ 2/9 02/18/2020 02/17/2019 01/28/2018  Decreased Interest 0 0 0  Down, Depressed, Hopeless 0 0 0  PHQ - 2 Score 0 0 0  Altered sleeping - - -  Tired, decreased energy - - -  Change in appetite - - -  Feeling bad or failure about yourself  - - -  Trouble concentrating - - -  Moving slowly or fidgety/restless - - -  Suicidal thoughts - - -  PHQ-9 Score - - -  Difficult doing work/chores - - -     Social History   Tobacco Use  Smoking Status Never  Smokeless Tobacco Never   BP Readings from Last 3 Encounters:  01/30/21 (!) 152/69  01/27/21 132/64  01/13/21 (!) 142/80   Pulse Readings from Last 3 Encounters:  01/30/21 71  01/27/21 77  01/13/21 73   Wt Readings from Last 3 Encounters:  01/30/21 160 lb (72.6 kg)  01/27/21 160 lb (72.6 kg)  01/13/21 161 lb (73 kg)   BMI Readings from Last 3 Encounters:  01/30/21 27.90 kg/m  01/27/21 27.46 kg/m  01/13/21 27.64 kg/m    Assessment/Interventions: Review of patient past  medical history, allergies, medications, health status, including review of consultants reports, laboratory and other test data, was performed as part of comprehensive evaluation and provision of chronic care management services.   SDOH:  (Social Determinants of Health) assessments and interventions performed: Yes  SDOH Screenings   Alcohol Screen: Not on file  Depression (PHQ2-9): Low Risk    PHQ-2 Score: 0  Financial Resource Strain: Low Risk    Difficulty of Paying Living Expenses: Not hard at all  Food Insecurity: No Food Insecurity   Worried About Charity fundraiser in the Last Year: Never true   Ran Out of  Food in the Last Year: Never true  Housing: Not on file  Physical Activity: Not on file  Social Connections: Not on file  Stress: Not on file  Tobacco Use: Low Risk    Smoking Tobacco Use: Never   Smokeless Tobacco Use: Never   Passive Exposure: Not on file  Transportation Needs: No Transportation Needs   Lack of Transportation (Medical): No   Lack of Transportation (Non-Medical): No    CCM Care Plan  Allergies  Allergen Reactions   Codeine Nausea And Vomiting    Dizzy    Medications Reviewed Today     Reviewed by Shona Needles, RT on 01/30/21 at [facility]  Med List Status: <None>   Medication Order Taking? Sig Documenting Provider Last Dose Status Informant  acetaminophen (TYLENOL) 500 MG tablet [id number] Yes Take 500 mg by mouth every 6 (six) hours as needed. [provider] Taking Active   amLODipine (NORVASC) 10 MG tablet [id number] Yes Take 1 tablet by mouth once daily Binnie Rail, MD Taking Active   aspirin 81 MG EC tablet [id number] Yes Take 81 mg by mouth daily. [provider] Taking Active   Calcium Carbonate-Vit D-Min (CALCIUM 1200 PO) [id number] Yes Take by mouth. [provider] Taking Active   carvedilol (COREG) 6.25 MG tablet [id number] Yes Take 1 tablet by mouth twice daily Wellington Hampshire, MD Taking Active    Cholecalciferol (VITAMIN D-3) 1000 UNITS CAPS [id number] Yes Take 1 capsule by mouth daily. [provider] Taking Active   famotidine (PEPCID) 40 MG tablet [id number] Yes Take 1 tablet (40 mg total) by mouth daily. Binnie Rail, MD Taking Active   levothyroxine (SYNTHROID) 50 MCG tablet [id number] Yes Take 1 tablet (50 mcg total) by mouth daily before breakfast. Binnie Rail, MD Taking Active   losartan (COZAAR) 100 MG tablet [id number] Yes Take 1 tablet by mouth once daily [name], MD Taking Active   metFORMIN (GLUCOPHAGE XR) 500 MG 24 hr tablet [id number] Yes Take 1 tablet (500 mg total) by mouth daily with breakfast. [name], MD Taking Active   pravastatin (PRAVACHOL) 20 MG tablet [id number] Yes Take 1 tablet by mouth once daily [name], MD Taking Active   vitamin B-12 (CYANOCOBALAMIN) 1000 MCG tablet [id number] Yes Take 1,000 mcg by mouth daily. [provider] Taking Active             Patient Active Problem List   Diagnosis Date Noted   Positive ANA (antinuclear antibody) 01/30/2021   IPF (idiopathic pulmonary fibrosis) ([place]) 12/07/2020   Foot pain, right 12/07/2020   Cough 11/01/2020   Abnormal chest x-ray 10/31/2020   Diabetes mellitus type 2 with complications ([place]) 80/99/8338   Primary osteoarthritis involving multiple joints 02/17/2019   Hyperlipidemia associated with type 2 diabetes mellitus ([place]) 10/20/2009   GERD 03/25/2008   Back pain 10/03/2007   Hypothyroidism 02/05/2007   B12 deficiency 02/05/2007   Vitamin D deficiency 02/05/2007   Essential hypertension, benign 02/05/2007    Immunization History  Administered Date(s) Administered   Fluad Quad(high Dose 65+) 12/12/2018   Influenza Whole 01/31/2009, 02/28/2012   Influenza, High Dose Seasonal PF 01/06/2018, 12/12/2018   Influenza,inj,Quad PF,6+ Mos 01/11/2014   Influenza-Unspecified 01/29/2013, 12/22/2015, 12/08/2016, 01/09/2020   Moderna Sars-Covid-2  Vaccination 06/17/2019, 07/15/2019, 03/16/2020   Pneumococcal Conjugate-13 12/14/2014   Pneumococcal Polysaccharide-23 05/08/2012   Tetanus 05/08/2012    Conditions to be addressed/monitored:  Hypertension, Hyperlipidemia, Diabetes, Chronic Kidney Disease and Allergic Rhinitis  Care Plan : CCM Care Plan  Updates made by [name], RPH since 02/02/2021 12:00 AM     Problem: HTN, HLD, DM2, Hypothyroidism, GERD, Allergic Rhinitis   Priority: High  Onset Date: 09/14/2020     Long-Range Goal: Disease Management   Start Date: 09/14/2020  Expected End Date: 12/15/2020  This Visit's Progress: On track  Recent Progress: On track  Priority: High  Note:   Current Barriers:  Unable to independently monitor therapeutic efficacy  Pharmacist Clinical Goal(s):  Patient will verbalize ability to afford treatment regimen achieve adherence to monitoring guidelines and medication adherence to achieve therapeutic efficacy maintain control of Blood pressure and Blood sugars  as evidenced by blood pressure logs and A1c with next labs   through collaboration with PharmD and provider.   Interventions: 1:1 collaboration with [name], MD regarding development and update of comprehensive plan of care as evidenced by provider attestation and co-signature Inter-disciplinary care team collaboration (see longitudinal plan of care) Comprehensive medication review performed; medication list updated in electronic medical record  Hypertension (BP goal <140/90) -Controlled -Current treatment: Carvedilol 6.7m twice daily  Losartan 1016mdaily  Amlodipine 1025maily  -Medications previously tried: atenolol, nebivolol, lisinopril, HCTZ, furosemide  -Current home readings: n/a - slightly elevated with latest office visits, likely due to pain from fracture in her foot BP Readings from Last 3 Encounters:  01/30/21 (!) 152/69  01/27/21 132/64  01/13/21 (!) 142/80  -Current dietary habits: does  not salt her food, uses only sodium reduced foods, drinks decaf coffee if any at all -Current exercise habits: limited due to pain in her hips/legs -Denies hypotensive/hypertensive symptoms -Educated on BP goals and benefits of medications for prevention of heart attack, stroke and kidney damage; Daily salt intake goal < 2300 mg; Importance of home blood pressure monitoring; -Counseled to monitor BP at home 1-2 times weekly, document, and provide log at future appointments -Counseled on diet and exercise extensively Recommended to continue current medication  Hyperlipidemia: (LDL goal < 70) -Not ideally controlled - improving  - Last LDL 103 mg/dL - (12/08/2020) -Current treatment: Pravastatin 44m44mily  ASA 81mg40mly  -Medications previously tried: Tricor, lovaza, lovastatin    -Current dietary patterns: reports that she does eat bacon for breakfast on occasion, but does not eat much fried/ fatty foods aside from that  -Current exercise habits: limited due to pain in hips/legs  -Educated on Cholesterol goals;  Benefits of statin for ASCVD risk reduction; Importance of limiting foods high in cholesterol; -Counseled on diet and exercise extensively Recommended to continue current medication  Diabetes (A1c goal <7%) -Controlled  Lab Results  Component Value Date   HGBA1C 6.2 12/08/2020  - Last GFR 42.39 mL/min (12/08/2020) -Current medications: Metformin XR 500mg 33mtablet daily  -Medications previously tried: n/a  -Denies hypoglycemic/hyperglycemic symptoms -Current meal patterns:  breakfast: fruit, toast, eggs, bacon  lunch: salad / sandwich   dinner: potatoes, chicken, vegetables  snacks: peanutbutter and crackers / popcorn drinks: water, sugar free powerade, diet cheerwine  -Current exercise: limited due to pain/ cramps in her legs  -Educated on A1c and blood sugar goals; Complications of diabetes including kidney damage, retinal damage, and cardiovascular  disease; Benefits of weight loss; common side effect of metformin being diarrhea, will plan to switch to metformin XR to prevent bowel issues  -Counseled to check feet daily and get yearly eye exams -Recommended for patient to continue current medications  GERD (Goal: acid control / prevention of  reflux ) -Controlled -Current treatment  Famotidine 45m daily  -Medications previously tried: ranitidine   -Recommending no changes to medications at this time   Hypothyroidism (Goal: Maintenance of euthyroid levels) -Controlled  Lab Results  Component Value Date   TSH 3.02 12/08/2020  -Current treatment  Levothyroxine 553m daily  -Medications previously tried: synthroid  -Recommended to continue current medication   UIP pattern Pulmonary Fibrosis (Goal: Prevention of disease progression / management of symptoms) - following with Pulmonology -Current treatment  N/a - to start on Esbriet in near future -Medications previously tried: n/a  -Recommended to continue current medication   Health Maintenance -Current therapy:  Calcium Carbonate-Vit D3 - 1200 units - 1 tablet daily  Vit D3  - 1000 units daily  Vit B-12 100056mdaily  Acetaminophen 500m44m1 tablet every 6 hours as needed  -Educated on Cost vs benefit of each product must be carefully weighed by individual consumer -Patient is satisfied with current therapy and denies issues -Recommended to continue current medication  Patient Goals/Self-Care Activities Patient will:  - take medications as prescribed check blood pressure 1-2 time weekly , document, and provide at future appointments  Follow Up Plan: Telephone follow up appointment with care management team member scheduled for: 3 months  The patient has been provided with contact information for the care management team and has been advised to call with any health related questions or concerns.  Next PCP appointment scheduled for: 8 weeks      Medication  Assistance: None required.  Patient affirms current coverage meets needs.  [id number]: [phone]: [phone] pill box? No - feels she can manage without  Pt endorses 100% compliance  Care Plan and Follow Up Patient Decision:  Patient agrees to Care Plan and Follow-up.  Plan: Telephone follow up appointment with care management team member scheduled for:  3 months, The patient has been provided with contact information for the care management team and has been advised to call with any health related questions or concerns.  and   DaniTomasa BlasearmD Clinical Pharmacist, [place]

## 2021-02-06 DIAGNOSIS — E039 Hypothyroidism, unspecified: Secondary | ICD-10-CM | POA: Diagnosis not present

## 2021-02-06 DIAGNOSIS — E118 Type 2 diabetes mellitus with unspecified complications: Secondary | ICD-10-CM

## 2021-02-06 DIAGNOSIS — I10 Essential (primary) hypertension: Secondary | ICD-10-CM

## 2021-02-06 DIAGNOSIS — E1169 Type 2 diabetes mellitus with other specified complication: Secondary | ICD-10-CM | POA: Diagnosis not present

## 2021-02-06 DIAGNOSIS — E785 Hyperlipidemia, unspecified: Secondary | ICD-10-CM

## 2021-02-07 ENCOUNTER — Other Ambulatory Visit: Payer: Self-pay

## 2021-02-07 ENCOUNTER — Ambulatory Visit (INDEPENDENT_AMBULATORY_CARE_PROVIDER_SITE_OTHER)
Admission: RE | Admit: 2021-02-07 | Discharge: 2021-02-07 | Disposition: A | Discharge status: Home / Self Care | Payer: HMO | Source: Ambulatory Visit | Attending: Pulmonary Disease | Admitting: Pulmonary Disease

## 2021-02-07 ENCOUNTER — Encounter (INDEPENDENT_AMBULATORY_CARE_PROVIDER_SITE_OTHER): Payer: Self-pay

## 2021-02-07 DIAGNOSIS — I358 Other nonrheumatic aortic valve disorders: Secondary | ICD-10-CM | POA: Diagnosis not present

## 2021-02-07 DIAGNOSIS — I2510 Atherosclerotic heart disease of native coronary artery without angina pectoris: Secondary | ICD-10-CM | POA: Diagnosis not present

## 2021-02-07 DIAGNOSIS — J849 Interstitial pulmonary disease, unspecified: Secondary | ICD-10-CM | POA: Diagnosis not present

## 2021-02-07 DIAGNOSIS — J479 Bronchiectasis, uncomplicated: Secondary | ICD-10-CM | POA: Diagnosis not present

## 2021-02-07 DIAGNOSIS — K5939 Other megacolon: Secondary | ICD-10-CM | POA: Diagnosis not present

## 2021-02-08 ENCOUNTER — Telehealth: Payer: Self-pay | Admitting: Pulmonary Disease

## 2021-02-08 NOTE — Telephone Encounter (Signed)
Call made to patient, confirmed DOB. Patient states she received a letter stating her exbriet was approved however she called [facility] and they dont have the medication. I made her aware this is a specialty medication and would come from a specialized pharmacy. I made her aware I would route this message to the pharmacy team and we would get back with her.   Pharmacy team let us know what we can do to help. Thanks.

## 2021-02-08 NOTE — Telephone Encounter (Signed)
At this time we are awaiting a acceptance determination from Patient Assistance program as medication remains unaffordable even after insurance coverage. I will reach out and discuss this with patient. At this time nothing further is needed and this encounter may be closed, we are documenting updates in separate ongoing encounter. Thank you!

## 2021-02-08 NOTE — Telephone Encounter (Signed)
Pt reached out inquiring about medication status. Returned call to pt and explained where we are at in the PAP process. Advised that pt should expect a phone call from the company sometime soon. Pt verbalized understanding. Will continue to f/u.

## 2021-02-10 NOTE — Telephone Encounter (Signed)
Reached out to [employer] and confirmed that application is still in-process. Verified that all relevant documents were received successfully. Will continue to f/u. 

## 2021-02-15 NOTE — Telephone Encounter (Signed)
Per [employer] portal, pt has been approved for ESBRIET patient assistance beginning 02/10/2021. Pt will remain active until therapy is discontinued, there are any significant changes to pt's health insurance or financial status, or pt no longer meets program eligibility requirements.   Called pt and verified that they were aware of approval and that shipment of medication had been arranged. Pt has no further questions or concerns at this time. Approval letter printed from portal and sent to scan center along with application. 

## 2021-02-20 ENCOUNTER — Telehealth: Payer: Self-pay | Admitting: Pulmonary Disease

## 2021-02-21 ENCOUNTER — Telehealth: Payer: Self-pay | Admitting: Pulmonary Disease

## 2021-02-21 DIAGNOSIS — Z5181 Encounter for therapeutic drug level monitoring: Secondary | ICD-10-CM

## 2021-02-21 DIAGNOSIS — Z7189 Other specified counseling: Secondary | ICD-10-CM

## 2021-02-21 DIAGNOSIS — J849 Interstitial pulmonary disease, unspecified: Secondary | ICD-10-CM

## 2021-02-21 MED ORDER — PIRFENIDONE 267 MG PO TABS: 801.0000 mg | ORAL_TABLET | Freq: Three times a day (TID) | ORAL | 1 refills | Status: DC

## 2021-02-21 MED ORDER — PIRFENIDONE 267 MG PO TABS: ORAL_TABLET | ORAL | 0 refills | Status: DC

## 2021-02-21 MED ORDER — PIRFENIDONE 267 MG PO TABS
801.0000 mg | ORAL_TABLET | Freq: Three times a day (TID) | ORAL | 1 refills | Status: DC
Start: 2021-02-21 — End: 2021-02-21

## 2021-02-21 NOTE — Telephone Encounter (Signed)
Refill for Esbriet 801mg  three times daily sent to [facility].  ATC patient to review Esbriet and provide counseling if she had questions. Unable to reach but left VM requesting return call.  , PharmD, MPH, BCPS Clinical Pharmacist (Rheumatology and Pulmonology)

## 2021-02-21 NOTE — Telephone Encounter (Addendum)
ATC patient to review Esbriet. Unable to reach. Left VM requesting return call to discuss. Will f/u in separate telephone encounter.  Chesley Mires, PharmD, MPH, BCPS Clinical Pharmacist (Rheumatology and Pulmonology)

## 2021-02-21 NOTE — Telephone Encounter (Signed)
Noted.   Will route to pharmacy team.

## 2021-02-21 NOTE — Telephone Encounter (Signed)
Refill sent to [facility] for Esbriet. Will f/u in previous telephone encounter.

## 2021-02-28 NOTE — Telephone Encounter (Addendum)
Subjective:  Patient presents today to [facility] to see pharmacy team for Esbriet new start.   Patient was last seen and referred by Dr. Vaughan Browner on 01/27/21.  Pertinent past medical history includes IPF, hypertension, diabetes, hyperlipidemia, allergies, GERD on PPI.  History of elevated LFTs: No History of diarrhea, nausea, vomiting: No  Objective: Allergies  Allergen Reactions   Codeine Nausea And Vomiting    Dizzy    Outpatient Encounter Medications as of 02/21/2021  Medication Sig   Pirfenidone (ESBRIET) 267 MG TABS Take 1 tab three times daily for 7 days, then 2 tabs three times daily for 7 days, then 3 tabs three times daily thereafter.   [DISCONTINUED] Pirfenidone (ESBRIET) 267 MG TABS Take 3 tablets (801 mg total) by mouth with breakfast, with lunch, and with evening meal.   acetaminophen (TYLENOL) 500 MG tablet Take 500 mg by mouth every 6 (six) hours as needed.   amLODipine (NORVASC) 10 MG tablet Take 1 tablet by mouth once daily   aspirin 81 MG EC tablet Take 81 mg by mouth daily.   Calcium Carbonate-Vit D-Min (CALCIUM 1200 PO) Take by mouth.   carvedilol (COREG) 6.25 MG tablet Take 1 tablet by mouth twice daily   Cholecalciferol (VITAMIN D-3) 1000 UNITS CAPS Take 1 capsule by mouth daily.   famotidine (PEPCID) 40 MG tablet Take 1 tablet (40 mg total) by mouth daily.   levothyroxine (SYNTHROID) 50 MCG tablet Take 1 tablet (50 mcg total) by mouth daily before breakfast.   losartan (COZAAR) 100 MG tablet Take 1 tablet by mouth once daily   metFORMIN (GLUCOPHAGE XR) 500 MG 24 hr tablet Take 1 tablet (500 mg total) by mouth daily with breakfast.   Pirfenidone (ESBRIET) 267 MG TABS Take 3 tablets (801 mg total) by mouth with breakfast, with lunch, and with evening meal.   pravastatin (PRAVACHOL) 20 MG tablet Take 1 tablet by mouth once daily   vitamin B-12 (CYANOCOBALAMIN) 1000 MCG tablet Take 1,000 mcg by mouth daily.   No facility-administered encounter medications  on file as of 02/21/2021.     Immunization History  Administered Date(s) Administered   Fluad Quad(high Dose 65+) 12/12/2018   Influenza Whole 01/31/2009, 02/28/2012   Influenza, High Dose Seasonal PF 01/06/2018, 12/12/2018   Influenza,inj,Quad PF,6+ Mos 01/11/2014   Influenza-Unspecified 01/29/2013, 12/22/2015, 12/08/2016, 01/09/2020   Moderna Sars-Covid-2 Vaccination 06/17/2019, 07/15/2019, 03/16/2020   Pneumococcal Conjugate-13 12/14/2014   Pneumococcal Polysaccharide-23 05/08/2012   Tetanus 05/08/2012      PFT's TLC  Date Value Ref Range Status  01/27/2021 2.99 L Final      CMP     Component Value Date/Time   NA 136 12/08/2020 1502   K 4.3 12/08/2020 1502   CL 101 12/08/2020 1502   CO2 26 12/08/2020 1502   GLUCOSE 72 12/08/2020 1502   BUN 22 12/08/2020 1502   CREATININE 1.19 12/08/2020 1502   CALCIUM 9.6 12/08/2020 1502   PROT 7.6 12/08/2020 1502   ALBUMIN 4.3 12/08/2020 1502   AST 15 12/08/2020 1502   ALT 12 12/08/2020 1502   ALKPHOS 53 12/08/2020 1502   BILITOT 0.6 12/08/2020 1502   GFRNONAA 83 (L) 12/27/2012 0430   GFRAA >90 12/27/2012 0430      CBC    Component Value Date/Time   WBC 8.4 12/08/2020 1502   RBC 4.36 12/08/2020 1502   HGB 12.3 12/08/2020 1502   HCT 37.2 12/08/2020 1502   PLT 259.0 12/08/2020 1502   MCV 85.3 12/08/2020 1502  MCH 28.8 12/26/2012 2236   MCHC 33.1 12/08/2020 1502   RDW 13.7 12/08/2020 1502   LYMPHSABS 2.4 12/08/2020 1502   MONOABS 0.9 12/08/2020 1502   EOSABS 0.2 12/08/2020 1502   BASOSABS 0.0 12/08/2020 1502      LFT's Hepatic Function Latest Ref Rng & Units 12/08/2020 02/26/2020 02/17/2019  Total Protein 6.0 - 8.3 g/dL 7.6 8.0 7.7  Albumin 3.5 - 5.2 g/dL 4.3 4.3 4.3  AST 0 - 37 U/L 15 17 21   ALT 0 - 35 U/L 12 15 25   Alk Phosphatase 39 - 117 U/L 53 54 64  Total Bilirubin 0.2 - 1.2 mg/dL 0.6 0.6 0.5  Bilirubin, Direct 0.0 - 0.3 mg/dL - - -      HRCT (1. The appearance of the lungs is compatible with  interstitial lung disease, considered diagnostic of usual interstitial pneumonia (UIP) per current ATS guidelines. 2. Dilatation of the pulmonic trunk (3.5 cm in diameter), concerning for associated pulmonary arterial hypertension. 3. Mild cardiomegaly. 4. Aortic atherosclerosis, in addition to left main and 3 vessel coronary artery disease. 5. There are calcifications of the aortic valve and mitral annulus. Echocardiographic correlation for evaluation of potential valvular dysfunction may be warranted if clinically indicated)  Assessment and Plan  Esbriet Medication Management Thoroughly counseled patient on the efficacy, mechanism of action, dosing, administration, adverse effects, and monitoring parameters of Esbriet.  Patient verbalized understanding. Patient education handout provided.   Goals of Therapy: Will not stop or reverse the progression of ILD. It will slow the progression of ILD.   Dosing: Starting dose will be Esbriet 267 mg 1 tablet three times daily for 7 days, then 2 tablets three times daily for 7 days, then 3 tablets three times daily.  Maintenance dose will be 801 mg 1 tablet three times daily if tolerated.  Stressed the importance of taking with meals and space at least 5-6 hours apart to minimize stomach upset.   Adverse Effects: Nausea, vomiting, diarrhea, weight loss Abdominal pain GERD - pt reported some GERD with Esbriet, takes pantoprazole and Pepcid Sun sensitivity/rash - patient advised to wear sunscreen when exposed to sunlight Dizziness Fatigue  Monitoring: Monitor for diarrhea, nausea and vomiting, GI perforation, hepatotoxicity  Monitor LFTs - baseline, monthly for first 6 months, then every 3 months routinely - future order for hepatic panel placed. Patient aware to stop by for labwork after completing first bottle of Esbriet WNL 12/08/20 CBC w differential at baseline and every 3 months routinely  Access: Approval of Esbriet through: patient  assistance Rx sent to: [facility], dispensing) for [place]: [phone]  Patient received Esbriet and started taking 02/23/21  Medication Reconciliation A drug regimen assessment was performed, including review of allergies, interactions, disease-state management, dosing and immunization history. Medications were reviewed with the patient, including name, instructions, indication, goals of therapy, potential side effects, importance of adherence, and safe use.  Immunizations Patient is indicated for the influenzae, pneumonia, and shingles vaccinations. Patient has received 3 COVID19 vaccines.  This appointment required 15 minutes of patient care (this includes precharting, chart review, review of results, face-to-face care, etc.).  Thank you for involving pharmacy to assist in providing this patient's care.  Michela Pitcher) B[place], PharmD Student

## 2021-03-05 ENCOUNTER — Encounter: Payer: Self-pay | Admitting: Internal Medicine

## 2021-03-05 DIAGNOSIS — M8580 Other specified disorders of bone density and structure, unspecified site: Secondary | ICD-10-CM | POA: Insufficient documentation

## 2021-03-05 DIAGNOSIS — M810 Age-related osteoporosis without current pathological fracture: Secondary | ICD-10-CM | POA: Insufficient documentation

## 2021-03-05 NOTE — Patient Instructions (Addendum)
Blood work was ordered.     Medications changes include :   increase pepcid to 40 mg twice a day.   Your prescription(s) have been submitted to your pharmacy. Please take as directed and contact our office if you believe you are having problem(s) with the medication(s).  A dexa or bone density scan was ordered.   Please followup in 6 months    Health Maintenance, Female Adopting a healthy lifestyle and getting preventive care are important in promoting health and wellness. Ask your health care provider about: The right schedule for you to have regular tests and exams. Things you can do on your own to prevent diseases and keep yourself healthy. What should I know about diet, weight, and exercise? Eat a healthy diet  Eat a diet that includes plenty of vegetables, fruits, low-fat dairy products, and lean protein. Do not eat a lot of foods that are high in solid fats, added sugars, or sodium. Maintain a healthy weight Body mass index (BMI) is used to identify weight problems. It estimates body fat based on height and weight. Your health care provider can help determine your BMI and help you achieve or maintain a healthy weight. Get regular exercise Get regular exercise. This is one of the most important things you can do for your health. Most adults should: Exercise for at least 150 minutes each week. The exercise should increase your heart rate and make you sweat (moderate-intensity exercise). Do strengthening exercises at least twice a week. This is in addition to the moderate-intensity exercise. Spend less time sitting. Even light physical activity can be beneficial. Watch cholesterol and blood lipids Have your blood tested for lipids and cholesterol at 84 years of age, then have this test every 5 years. Have your cholesterol levels checked more often if: Your lipid or cholesterol levels are high. You are older than 83 years of age. You are at high risk for heart disease. What  should I know about cancer screening? Depending on your health history and family history, you may need to have cancer screening at various ages. This may include screening for: Breast cancer. Cervical cancer. Colorectal cancer. Skin cancer. Lung cancer. What should I know about heart disease, diabetes, and high blood pressure? Blood pressure and heart disease High blood pressure causes heart disease and increases the risk of stroke. This is more likely to develop in people who have high blood pressure readings or are overweight. Have your blood pressure checked: Every 3-5 years if you are 65-51 years of age. Every year if you are 25 years old or older. Diabetes Have regular diabetes screenings. This checks your fasting blood sugar level. Have the screening done: Once every three years after age 20 if you are at a normal weight and have a low risk for diabetes. More often and at a younger age if you are overweight or have a high risk for diabetes. What should I know about preventing infection? Hepatitis B If you have a higher risk for hepatitis B, you should be screened for this virus. Talk with your health care provider to find out if you are at risk for hepatitis B infection. Hepatitis C Testing is recommended for: Everyone born from [birth year] through 1963. Anyone with known risk factors for hepatitis C. Sexually transmitted infections (STIs) Get screened for STIs, including gonorrhea and chlamydia, if: You are sexually active and are younger than 83 years of age. You are older than 83 years of age and your health care provider  tells you that you are at risk for this type of infection. Your sexual activity has changed since you were last screened, and you are at increased risk for chlamydia or gonorrhea. Ask your health care provider if you are at risk. Ask your health care provider about whether you are at high risk for HIV. Your health care provider may recommend a prescription medicine  to help prevent HIV infection. If you choose to take medicine to prevent HIV, you should first get tested for HIV. You should then be tested every 3 months for as long as you are taking the medicine. Pregnancy If you are about to stop having your period (premenopausal) and you may become pregnant, seek counseling before you get pregnant. Take 400 to 800 micrograms (mcg) of folic acid every day if you become pregnant. Ask for birth control (contraception) if you want to prevent pregnancy. Osteoporosis and menopause Osteoporosis is a disease in which the bones lose minerals and strength with aging. This can result in bone fractures. If you are 75 years old or older, or if you are at risk for osteoporosis and fractures, ask your health care provider if you should: Be screened for bone loss. Take a calcium or vitamin D supplement to lower your risk of fractures. Be given hormone replacement therapy (HRT) to treat symptoms of menopause. Follow these instructions at home: Alcohol use Do not drink alcohol if: Your health care provider tells you not to drink. You are pregnant, may be pregnant, or are planning to become pregnant. If you drink alcohol: Limit how much you have to: 0-1 drink a day. Know how much alcohol is in your drink. In the [place] one drink equals one 12 oz bottle of beer (355 mL), one 5 oz glass of wine (148 mL), or one 1 oz glass of hard liquor (44 mL). Lifestyle Do not use any products that contain nicotine or tobacco. These products include cigarettes, chewing tobacco, and vaping devices, such as e-cigarettes. If you need help quitting, ask your health care provider. Do not use street drugs. Do not share needles. Ask your health care provider for help if you need support or information about quitting drugs. General instructions Schedule regular health, dental, and eye exams. Stay current with your vaccines. Tell your health care provider if: You often feel depressed. You  have ever been abused or do not feel safe at home. Summary Adopting a healthy lifestyle and getting preventive care are important in promoting health and wellness. Follow your health care provider's instructions about healthy diet, exercising, and getting tested or screened for diseases. Follow your health care provider's instructions on monitoring your cholesterol and blood pressure. This information is not intended to replace advice given to you by your health care provider. Make sure you discuss any questions you have with your health care provider. Document Revised: 08/15/2020 Document Reviewed: 08/15/2020 Elsevier Patient Education  2022 [employer].

## 2021-03-05 NOTE — Progress Notes (Signed)
Subjective:    Patient ID: Kayla Craig, female    DOB: 1935/04/15, 85 y.o.   MRN: [id number]   This visit occurred during the SARS-CoV-2 public health emergency.  Safety protocols were in place, including screening questions prior to the visit, additional usage of staff PPE, and extensive cleaning of exam room while observing appropriate contact time as indicated for disinfecting solutions.    HPI She is here for a physical exam.   Her back has been hurting x last couple of days.  It often hurts when she stands a lot.  She usually takes Tylenol.  She has been coughing since march.  She did see her lung doctor recently and he advised that I would need to do something for this.   She has had GERD, belching a lot and has decreased appetite.  She thinks some of this may have started with starting the lung medication.  Her toes get painful numbness at night. She only feels it at night.  It started a long time ago.      Medications and allergies reviewed with patient and updated if appropriate.  Patient Active Problem List   Diagnosis Date Noted   Chronic low back pain 03/06/2021   Osteopenia 03/05/2021   Positive ANA (antinuclear antibody) 01/30/2021   IPF (idiopathic pulmonary fibrosis) ([place]) 12/07/2020   Foot pain, right 12/07/2020   Cough 11/01/2020   Abnormal chest x-ray 10/31/2020   Diabetes mellitus type 2 with complications ([place]) [id number]   Primary osteoarthritis involving multiple joints 02/17/2019   Hyperlipidemia associated with type 2 diabetes mellitus ([place]) 10/20/2009   GERD 03/25/2008   Back pain 10/03/2007   Hypothyroidism 02/05/2007   B12 deficiency 02/05/2007   Vitamin D deficiency 02/05/2007   Essential hypertension, benign 02/05/2007    Current Outpatient Medications on File Prior to Visit  Medication Sig Dispense Refill   acetaminophen (TYLENOL) 500 MG tablet Take 500 mg by mouth every 6 (six) hours as needed.     amLODipine (NORVASC) 10 MG  tablet Take 1 tablet by mouth once daily 90 tablet 0   aspirin 81 MG EC tablet Take 81 mg by mouth daily.     Calcium Carbonate-Vit D-Min (CALCIUM 1200 PO) Take by mouth.     carvedilol (COREG) 6.25 MG tablet Take 1 tablet by mouth twice daily 180 tablet 0   Cholecalciferol (VITAMIN D-3) 1000 UNITS CAPS Take 1 capsule by mouth daily.     levothyroxine (SYNTHROID) 50 MCG tablet Take 1 tablet (50 mcg total) by mouth daily before breakfast. 90 tablet 1   losartan (COZAAR) 100 MG tablet Take 1 tablet by mouth once daily 90 tablet 0   metFORMIN (GLUCOPHAGE XR) 500 MG 24 hr tablet Take 1 tablet (500 mg total) by mouth daily with breakfast. 90 tablet 3   Pirfenidone (ESBRIET) 267 MG TABS Take 1 tab three times daily for 7 days, then 2 tabs three times daily for 7 days, then 3 tabs three times daily thereafter. 207 tablet 0   Pirfenidone (ESBRIET) 267 MG TABS Take 3 tablets (801 mg total) by mouth with breakfast, with lunch, and with evening meal. 270 tablet 1   pravastatin (PRAVACHOL) 20 MG tablet Take 1 tablet by mouth once daily 90 tablet 0   vitamin B-12 (CYANOCOBALAMIN) 1000 MCG tablet Take 1,000 mcg by mouth daily.     No current facility-administered medications on file prior to visit.    Past Medical History:  Diagnosis Date   B12  deficiency    Back pain    Chronic   Goiter    Grief    Grief and loss related depression '08   Hypertension    TMJ (dislocation of temporomandibular joint)    Right Side   Vitamin D deficiency     Past Surgical History:  Procedure Laterality Date   EYE SURGERY  2010   cataract surgery with implants   KNEE SURGERY     Right Arthroscopy '05 Dr Charlann Boxerlin   THYROIDECTOMY     August '09 for goiter (Dr Jearld FentonByers)   TUBAL LIGATION      Social History   Socioeconomic History   Marital status: Single    Spouse name: Not on file   Number of children: 2   Years of education: 12   Highest education level: Not on file  Occupational History   Occupation: retired     Associate Professormployer: RETIRED  Tobacco Use   Smoking status: Never   Smokeless tobacco: Never  Vaping Use   Vaping Use: Never used  Substance and Sexual Activity   Alcohol use: No    Alcohol/week: 0.0 standard drinks   Drug use: Never   Sexual activity: Never  Other Topics Concern   Not on file  Social History Narrative   HSG. [place]. 2 sons- '62 (died lung cancer '07), '69; 3 grandchildren. 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 Lives alone: I-ADLS, lives in Sr Citizen's complex.. End of life issues: patient would want CPR, DNI, not to be maintained in a vegative state. Laymen's guide packet provided (july '11)   Social Determinants of Health   Financial Resource Strain: Low Risk    Difficulty of Paying Living Expenses: Not hard at all  Food Insecurity: Not on file  Transportation Needs: Not on file  Physical Activity: Not on file  Stress: Not on file  Social Connections: Not on file    Family History  Problem Relation Age of Onset   Cancer Mother    Heart disease Father        CAD/MI   Heart disease Brother        CAD/MI   Heart disease Brother        CAD/MI   Cancer Brother    Diabetes Neg Hx     Review of Systems  Constitutional:  Negative for chills and fever.  Eyes:  Negative for visual disturbance.  Respiratory:  Positive for cough (itchy throat - dry cough). Negative for chest tightness, shortness of breath and wheezing.   Cardiovascular:  Negative for chest pain, palpitations and leg swelling.  Gastrointestinal:  Positive for nausea. Negative for abdominal pain, blood in stool, constipation and diarrhea.       Gerd frequently, belching  Genitourinary:  Negative for dysuria.  Musculoskeletal:  Positive for arthralgias (knee) and back pain.  Skin:  Negative for rash.  Neurological:  Positive for headaches (occ). Negative for light-headedness.  Psychiatric/Behavioral:  Negative for dysphoric mood. The patient is not  nervous/anxious.       Objective:   Vitals:   03/06/21 1413  BP: (!) 142/78  Pulse: 69  Temp: 98.5 F (36.9 C)  SpO2: 97%   Filed Weights   03/06/21 1413  Weight: 156 lb 6.4 oz (70.9 kg)   Body mass index is 27.27 kg/m.  BP Readings from Last 3 Encounters:  03/06/21 (!) 142/78  01/30/21 (!) 152/69  01/27/21 132/64    Wt Readings from Last 3 Encounters:  03/06/21 156 lb 6.4 oz (70.9 kg)  01/30/21 160 lb (72.6 kg)  01/27/21 160 lb (72.6 kg)    Depression screen [facility] 2/9 02/18/2020 02/17/2019 01/28/2018 01/25/2017 01/10/2016  Decreased Interest 0 0 0 0 0  Down, Depressed, Hopeless 0 0 0 0 0  PHQ - 2 Score 0 0 0 0 0  Altered sleeping - - - 1 -  Tired, decreased energy - - - 0 -  Change in appetite - - - 0 -  Feeling bad or failure about yourself  - - - 0 -  Trouble concentrating - - - 0 -  Moving slowly or fidgety/restless - - - 0 -  Suicidal thoughts - - - 0 -  PHQ-9 Score - - - 1 -  Difficult doing work/chores - - - Not difficult at all -     No flowsheet data found.     Physical Exam Constitutional: She appears well-developed and well-nourished. No distress.  HENT:  Head: Normocephalic and atraumatic.  Right Ear: External ear normal. Normal ear canal and TM Left Ear: External ear normal.  Normal ear canal and TM Mouth/Throat: Oropharynx is clear and moist.  Eyes: Conjunctivae and EOM are normal.  Neck: Neck supple. No tracheal deviation present. No thyromegaly present.  No carotid bruit  Cardiovascular: Normal rate, regular rhythm and normal heart sounds.   No murmur heard.  No edema. Pulmonary/Chest: Effort normal and breath sounds normal. No respiratory distress. She has no wheezes. She has no rales.  Breast: deferred   Abdominal: Soft. She exhibits no distension. There is no tenderness.  Lymphadenopathy: She has no cervical adenopathy.  Skin: Skin is warm and dry. She is not diaphoretic.  Psychiatric: She has a normal mood and affect. Her behavior  is normal.   Diabetic Foot Exam - Simple   Simple Foot Form Visual Inspection See comments: Yes Sensation Testing Intact to touch and monofilament testing bilaterally: Yes Pulse Check Posterior Tibialis and Dorsalis pulse intact bilaterally: Yes Comments B/l bunions, acquired hammretoe left second toe      Lab Results  Component Value Date   WBC 8.4 12/08/2020   HGB 12.3 12/08/2020   HCT 37.2 12/08/2020   PLT 259.0 12/08/2020   GLUCOSE 72 12/08/2020   CHOL 201 (H) 12/08/2020   TRIG 352.0 (H) 12/08/2020   HDL 43.90 12/08/2020   LDLDIRECT 103.0 12/08/2020   ALT 12 12/08/2020   AST 15 12/08/2020   NA 136 12/08/2020   K 4.3 12/08/2020   CL 101 12/08/2020   CREATININE 1.19 12/08/2020   BUN 22 12/08/2020   CO2 26 12/08/2020   TSH 3.02 12/08/2020   HGBA1C 6.2 12/08/2020   MICROALBUR 3.4 (H) 02/26/2020         Assessment & Plan:   Physical exam: Screening blood work  ordered Exercise not regularly Weight okay for age Substance abuse  none   Reviewed recommended immunizations.   Health Maintenance  Topic Date Due   OPHTHALMOLOGY EXAM  Never done   DEXA SCAN  02/15/2006   COVID-19 Vaccine (4 - Booster for Moderna series) 03/22/2021 (Originally 05/11/2020)   Zoster Vaccines- Shingrix (1 of 2) 06/06/2021 (Originally 10/20/1987)   HEMOGLOBIN A1C  06/07/2021   FOOT EXAM  12/07/2021   TETANUS/TDAP  05/08/2022   Pneumonia Vaccine 36+ Years old  Completed   INFLUENZA VACCINE  Completed   HPV VACCINES  Aged Out          See Problem List for Assessment and Plan  of chronic medical problems.

## 2021-03-06 ENCOUNTER — Other Ambulatory Visit: Payer: Self-pay

## 2021-03-06 ENCOUNTER — Ambulatory Visit (INDEPENDENT_AMBULATORY_CARE_PROVIDER_SITE_OTHER): Payer: HMO | Admitting: Internal Medicine

## 2021-03-06 ENCOUNTER — Encounter: Payer: Self-pay | Admitting: Internal Medicine

## 2021-03-06 VITALS — BP 142/78 | HR 69 | Temp 98.5°F | Ht 63.5 in | Wt 156.4 lb

## 2021-03-06 DIAGNOSIS — E039 Hypothyroidism, unspecified: Secondary | ICD-10-CM

## 2021-03-06 DIAGNOSIS — M545 Low back pain, unspecified: Secondary | ICD-10-CM | POA: Diagnosis not present

## 2021-03-06 DIAGNOSIS — E559 Vitamin D deficiency, unspecified: Secondary | ICD-10-CM

## 2021-03-06 DIAGNOSIS — J84112 Idiopathic pulmonary fibrosis: Secondary | ICD-10-CM

## 2021-03-06 DIAGNOSIS — E538 Deficiency of other specified B group vitamins: Secondary | ICD-10-CM

## 2021-03-06 DIAGNOSIS — E118 Type 2 diabetes mellitus with unspecified complications: Secondary | ICD-10-CM

## 2021-03-06 DIAGNOSIS — G8929 Other chronic pain: Secondary | ICD-10-CM | POA: Diagnosis not present

## 2021-03-06 DIAGNOSIS — R053 Chronic cough: Secondary | ICD-10-CM

## 2021-03-06 DIAGNOSIS — I10 Essential (primary) hypertension: Secondary | ICD-10-CM | POA: Diagnosis not present

## 2021-03-06 DIAGNOSIS — M8589 Other specified disorders of bone density and structure, multiple sites: Secondary | ICD-10-CM

## 2021-03-06 DIAGNOSIS — Z0000 Encounter for general adult medical examination without abnormal findings: Secondary | ICD-10-CM | POA: Diagnosis not present

## 2021-03-06 DIAGNOSIS — E1169 Type 2 diabetes mellitus with other specified complication: Secondary | ICD-10-CM | POA: Diagnosis not present

## 2021-03-06 DIAGNOSIS — E785 Hyperlipidemia, unspecified: Secondary | ICD-10-CM | POA: Diagnosis not present

## 2021-03-06 DIAGNOSIS — K219 Gastro-esophageal reflux disease without esophagitis: Secondary | ICD-10-CM

## 2021-03-06 LAB — COMPREHENSIVE METABOLIC PANEL
ALT: 12 U/L (ref 0–35)
AST: 14 U/L (ref 0–37)
Albumin: 4.6 g/dL (ref 3.5–5.2)
Alkaline Phosphatase: 49 U/L (ref 39–117)
BUN: 19 mg/dL (ref 6–23)
CO2: 25 mEq/L (ref 19–32)
Calcium: 10.1 mg/dL (ref 8.4–10.5)
Chloride: 97 mEq/L (ref 96–112)
Creatinine, Ser: 1.14 mg/dL (ref 0.40–1.20)
GFR: 44.56 mL/min — ABNORMAL LOW (ref >60.00)
Glucose, Bld: 89 mg/dL (ref 70–99)
Potassium: 4.2 mEq/L (ref 3.5–5.1)
Sodium: 133 mEq/L — ABNORMAL LOW (ref 135–145)
Total Bilirubin: 0.4 mg/dL (ref 0.2–1.2)
Total Protein: 8.2 g/dL (ref 6.0–8.3)

## 2021-03-06 LAB — CBC WITH DIFFERENTIAL/PLATELET
Basophils Absolute: 0 10*3/uL (ref 0.0–0.1)
Basophils Relative: 0.3 % (ref 0.0–3.0)
Eosinophils Absolute: 0.1 10*3/uL (ref 0.0–0.7)
Eosinophils Relative: 2 % (ref 0.0–5.0)
HCT: 39.4 % (ref 36.0–46.0)
Hemoglobin: 13.1 g/dL (ref 12.0–15.0)
Lymphocytes Relative: 29.3 % (ref 12.0–46.0)
Lymphs Abs: 2.1 10*3/uL (ref 0.7–4.0)
MCHC: 33.3 g/dL (ref 30.0–36.0)
MCV: 87 fl (ref 78.0–100.0)
Monocytes Absolute: 0.6 10*3/uL (ref 0.1–1.0)
Monocytes Relative: 8.7 % (ref 3.0–12.0)
Neutro Abs: 4.3 10*3/uL (ref 1.4–7.7)
Neutrophils Relative %: 59.7 % (ref 43.0–77.0)
Platelets: 290 10*3/uL (ref 150.0–400.0)
RBC: 4.53 Mil/uL (ref 3.87–5.11)
RDW: 13.4 % (ref 11.5–15.5)
WBC: 7.3 10*3/uL (ref 4.0–10.5)

## 2021-03-06 LAB — VITAMIN D 25 HYDROXY (VIT D DEFICIENCY, FRACTURES): VITD: 36.02 ng/mL (ref 30.00–100.00)

## 2021-03-06 LAB — LIPID PANEL
Cholesterol: 223 mg/dL — ABNORMAL HIGH (ref 0–200)
HDL: 50.7 mg/dL (ref >39.00)
NonHDL: 172.52
Total CHOL/HDL Ratio: 4
Triglycerides: 301 mg/dL — ABNORMAL HIGH (ref 0.0–149.0)
VLDL: 60.2 mg/dL — ABNORMAL HIGH (ref 0.0–40.0)

## 2021-03-06 LAB — LDL CHOLESTEROL, DIRECT: Direct LDL: 122 mg/dL

## 2021-03-06 LAB — TSH: TSH: 2.06 u[IU]/mL (ref 0.35–5.50)

## 2021-03-06 LAB — VITAMIN B12: Vitamin B-12: 816 pg/mL (ref 211–911)

## 2021-03-06 LAB — HEMOGLOBIN A1C: Hgb A1c MFr Bld: 5.9 % (ref 4.6–6.5)

## 2021-03-06 MED ORDER — FAMOTIDINE 40 MG PO TABS: 40.0000 mg | ORAL_TABLET | Freq: Two times a day (BID) | ORAL | 2 refills | Status: DC

## 2021-03-06 NOTE — Assessment & Plan Note (Addendum)
Chronic Not controlled-possibly the cause of her cough Increase pepcid 40 mg to BID Advised that if this does not control her heartburn we will need to start a PPI again-was on pantoprazole in the past and we can consider starting a lower dose of pantoprazole 20 mg daily-advised that she needs to call and let me know if her heartburn does not improve

## 2021-03-06 NOTE — Assessment & Plan Note (Signed)
Chronic Intermittent with standing long periods of time Taking tylenol prn Deferred referral to specialist

## 2021-03-06 NOTE — Assessment & Plan Note (Signed)
Chronic Has not had a bone density in a long time DEXA ordered Continue calcium and vitamin D daily-we will check vitamin D level Encourage regular exercise

## 2021-03-06 NOTE — Assessment & Plan Note (Signed)
Chronic  Clinically euthyroid Currently taking levothyroxine 50 mcg daily Check tsh  Titrate med dose if needed  

## 2021-03-06 NOTE — Assessment & Plan Note (Signed)
Chronic Blood pressure well controlled CMP Continue amlodipine 10 mg daily, carvedilol 6.25 mg twice daily, losartan 100 mg daily

## 2021-03-06 NOTE — Assessment & Plan Note (Signed)
Chronic Has been coughing since March 2022 Seeing pulmonary and has idiopathic pulmonary fibrosis, which is not the cause of the cough Her GERD is not controlled and that may be the cause of her cough Will increase GERD medications If no improvement may need to add back PPI

## 2021-03-06 NOTE — Assessment & Plan Note (Signed)
Chronic Following with pulmonary Started on Esbriet 02/2021

## 2021-03-06 NOTE — Assessment & Plan Note (Signed)
Chronic Regular exercise and healthy diet encouraged Check lipid panel  Continue pravastatin 20 mg daily 

## 2021-03-06 NOTE — Assessment & Plan Note (Signed)
Sugars have been controlled Check A1c today Continue metformin XR 500 mg daily with breakfast

## 2021-03-07 ENCOUNTER — Telehealth: Payer: Self-pay | Admitting: Pharmacist

## 2021-03-07 NOTE — Telephone Encounter (Signed)
Patient left VM while I was OOO. She states that she has stomach upset and back pain.  She is currently taking Esbriet (2 tabs three times daily). States she takes each dose with food - sometimes meal other times can of soup since she does not eat three full meals.  I advised that sometimes Dr. Isaiah Serge will maintain patients at low doses of Esbriet based on tolerability but she should discuss in detail with him at tomorrow's visit.    Advised that back pain is not expected side effect of Esbriet, though if she is having constant coughing may add stress to her back. She saw her PCP Dr. Lawerance Bach who recommended she increase her famotidine to twice daily because it may be GERD that is causing cough.  She has been advised that she must called pharmacy to schedule shipment each month and can call as soon as she receives shipment at home.  F/u appt with Dr. Isaiah Serge on 03/08/21 - routing as Katheren Shams, PharmD, MPH, BCPS Clinical Pharmacist (Rheumatology and Pulmonology)

## 2021-03-08 ENCOUNTER — Ambulatory Visit: Payer: HMO | Admitting: Pulmonary Disease

## 2021-03-08 ENCOUNTER — Encounter: Payer: Self-pay | Admitting: Pulmonary Disease

## 2021-03-08 ENCOUNTER — Other Ambulatory Visit: Payer: Self-pay

## 2021-03-08 VITALS — BP 130/68 | HR 80 | Temp 98.3°F | Ht 64.0 in | Wt 156.6 lb

## 2021-03-08 DIAGNOSIS — J84112 Idiopathic pulmonary fibrosis: Secondary | ICD-10-CM

## 2021-03-08 DIAGNOSIS — Z5181 Encounter for therapeutic drug level monitoring: Secondary | ICD-10-CM | POA: Diagnosis not present

## 2021-03-08 DIAGNOSIS — I288 Other diseases of pulmonary vessels: Secondary | ICD-10-CM

## 2021-03-08 NOTE — Progress Notes (Signed)
Kayla Craig    [id number]    02-06-1936  Primary Care Physician:Burns, Bobette Mo, MD  Referring Physician: Pincus Sanes, MD [address] [place],  [place] [address]  Chief complaint: Follow-up for interstitial lung disease  HPI: 85 year old with history of hypertension, diabetes, hyperlipidemia, allergies, GERD on PPI, peripheral artery disease She has a dry nonproductive cough since March 2022.  No associated dyspnea fevers or chills.  Given Z-Pak by primary care in July 2022 Chest x-ray and follow-up CT showed interstitial lung disease and she has been referred to pulmonary for further evaluation  Has occasional joint pain.  No morning stiffness, difficulty swallowing, denies dry mouth, dry eyes  Pets: No pets Occupation: Used to work in a Transport planner Exposures: Was exposed to acetone while working.  No ongoing exposures.  No mold, hot tub, Jacuzzi.  No feather pillows or comforters Smoking history: Never smoker Travel history: No significant travel history Relevant family history: No family history of lung disease  Interim history: She has been evaluated by Dr. Dimple Casey, rheumatology in 01/30/2021 for elevated ANA.  He did not feel that she had any connective tissue disease Started on Esbriet in October 2022 and she is tolerating it well  She is here for review of follow-up CT scan  Outpatient Encounter Medications as of 03/08/2021  Medication Sig   acetaminophen (TYLENOL) 500 MG tablet Take 500 mg by mouth every 6 (six) hours as needed.   amLODipine (NORVASC) 10 MG tablet Take 1 tablet by mouth once daily   aspirin 81 MG EC tablet Take 81 mg by mouth daily.   Calcium Carbonate-Vit D-Min (CALCIUM 1200 PO) Take by mouth.   carvedilol (COREG) 6.25 MG tablet Take 1 tablet by mouth twice daily   Cholecalciferol (VITAMIN D-3) 1000 UNITS CAPS Take 1 capsule by mouth daily.   famotidine (PEPCID) 40 MG tablet Take 1 tablet (40 mg total) by mouth 2 (two) times daily.    levothyroxine (SYNTHROID) 50 MCG tablet Take 1 tablet (50 mcg total) by mouth daily before breakfast.   losartan (COZAAR) 100 MG tablet Take 1 tablet by mouth once daily   metFORMIN (GLUCOPHAGE XR) 500 MG 24 hr tablet Take 1 tablet (500 mg total) by mouth daily with breakfast. (Patient taking differently: Take 500 mg by mouth at bedtime.)   Pirfenidone (ESBRIET) 267 MG TABS Take 3 tablets (801 mg total) by mouth with breakfast, with lunch, and with evening meal.   pravastatin (PRAVACHOL) 20 MG tablet Take 1 tablet by mouth once daily   vitamin B-12 (CYANOCOBALAMIN) 1000 MCG tablet Take 1,000 mcg by mouth daily.   [DISCONTINUED] Pirfenidone (ESBRIET) 267 MG TABS Take 1 tab three times daily for 7 days, then 2 tabs three times daily for 7 days, then 3 tabs three times daily thereafter.   No facility-administered encounter medications on file as of 03/08/2021.    Physical Exam: Blood pressure 130/68, pulse 80, temperature 98.3 F (36.8 C), temperature source Oral, height 5\' 4"  (1.626 m), weight 156 lb 9.6 oz (71 kg), SpO2 96 %. Gen:      No acute distress HEENT:  EOMI, sclera anicteric Neck:     No masses; no thyromegaly Lungs:    Bibasal crackles CV:         Regular rate and rhythm; no murmurs Abd:      + bowel sounds; soft, non-tender; no palpable masses, no distension Ext:    No edema; adequate  peripheral perfusion Skin:      Warm and dry; no rash Neuro: alert and oriented x 3 Psych: normal mood and affect   Data Reviewed: Imaging: CT chest 11/10/2020-peripheral reticulation, traction bronchiectasis with minimal honeycombing.  UIP pattern pulmonary fibrosis  High-res CT lung 02/07/21-stable pattern of UIP pattern pulmonary fibrosis.  Nodular opacity in the left lung has remained stable I have reviewed the images personally.  PFTs: 01/27/2021 FVC 1.55 (62%], FEV1 1.38 [95%], F/F 89, TLC 2.99 [59%], DLCO 10.13 [54%] Severe restriction, diffusion defect  Labs: CTD serologies-ANA  12/06/2020- 1:1280, nuclear speckled Myositis panel positive for PL 7  Assessment:  IPF Has CT shows pattern of pulmonary fibrosis in UIP.  She does not have any exposures, signs and symptoms of connective tissue disease though ANA noted to be very high.  No evidence of connective tissue disease per rheumatology  Started on Esbriet for a diagnosis of IPF after discussion at multidisciplinary tumor board Hepatic panel 2 days ago normal. Recheck monthly labs  Enlarged pulmonary artery Echocardiogram ordered for evaluation of pulmonary hypertension  Lung nodule There appears to be an area of nodular fibrosis at the left lung base which may be part of the fibrotic process.  This has remained stable on follow-up.  Continue monitoring.    Plan/Recommendations: Continue Esbriet Echocardiogram Monthly labs.  Marshell Garfinkel MD [facility] 03/08/2021, 3:07 PM  CC: Binnie Rail, MD

## 2021-03-08 NOTE — Patient Instructions (Signed)
I am glad you are tolerating the medication well Your liver tests 2 days ago were normal Will continue to check comprehensive metabolic panel monthly for the next 3 months.  Please come to the front desk to get this drawn Return to clinic in 3 months

## 2021-03-08 NOTE — Addendum Note (Signed)
Addended by: Wyvonne Lenz on: 03/08/2021 03:24 PM   Modules accepted: Orders

## 2021-03-09 ENCOUNTER — Other Ambulatory Visit: Payer: Self-pay | Admitting: Internal Medicine

## 2021-03-09 MED ORDER — METFORMIN HCL ER 500 MG PO TB24: 500.0000 mg | ORAL_TABLET | Freq: Every day | ORAL | 3 refills | Status: DC

## 2021-03-12 ENCOUNTER — Telehealth: Payer: Self-pay | Admitting: Internal Medicine

## 2021-03-12 DIAGNOSIS — E785 Hyperlipidemia, unspecified: Secondary | ICD-10-CM

## 2021-03-12 MED ORDER — OMEPRAZOLE 20 MG PO CPDR: 20.0000 mg | DELAYED_RELEASE_CAPSULE | Freq: Every day | ORAL | 3 refills | Status: DC

## 2021-03-12 MED ORDER — PRAVASTATIN SODIUM 40 MG PO TABS: 40.0000 mg | ORAL_TABLET | Freq: Every day | ORAL | 2 refills | Status: DC

## 2021-03-12 NOTE — Telephone Encounter (Signed)
Med list updated - when she needs a refill she should let us know so we can send the higher dose.    Omeprazole sent to pharmacy for cough/reflux.  Take one daily 30 min prior to a meal.  Still take pecid 40 mg once a day.  May take a few weeks to see improvement.    F/u labs needed for cholesterol - ordered - please schedule about 6 weeks

## 2021-03-12 NOTE — Telephone Encounter (Signed)
-----   Message from Karma Ganja, [place] sent at 03/09/2021  2:47 PM EST ----- Results given to patient. She is fine with doubling up with the 20mg  that she has now but will need new script later.  She also wanted to let you know that increasing the famotidine did not help with the cough. She wanted to know what else you recommend.

## 2021-03-13 NOTE — Telephone Encounter (Signed)
Patient requesting a call back to discuss rx Omeprazole

## 2021-03-13 NOTE — Telephone Encounter (Signed)
Info given and lab appointment scheduled.

## 2021-03-13 NOTE — Telephone Encounter (Signed)
Attempted to reach patient this morning but no answer or voicemail.  Will call her back later.

## 2021-03-14 NOTE — Telephone Encounter (Signed)
Spoke with patient today and went over instructions regarding taking medication

## 2021-03-14 NOTE — Telephone Encounter (Signed)
Patient calling to check status of return call  *see below*  Please call patient

## 2021-03-28 NOTE — Telephone Encounter (Signed)
Pt was able to receive Esbriet Rx. Will close encounter.

## 2021-03-29 ENCOUNTER — Other Ambulatory Visit (HOSPITAL_COMMUNITY): Payer: HMO

## 2021-03-30 ENCOUNTER — Ambulatory Visit (INDEPENDENT_AMBULATORY_CARE_PROVIDER_SITE_OTHER): Payer: HMO

## 2021-03-30 ENCOUNTER — Other Ambulatory Visit: Payer: Self-pay

## 2021-03-30 DIAGNOSIS — Z0000 Encounter for general adult medical examination without abnormal findings: Secondary | ICD-10-CM | POA: Diagnosis not present

## 2021-03-30 NOTE — Progress Notes (Signed)
I connected with Kayla Craig today by telephone and verified that I am speaking with the correct person using two identifiers. Location patient: home Location provider: work Persons participating in the virtual visit: patient, provider.   I discussed the limitations, risks, security and privacy concerns of performing an evaluation and management service by telephone and the availability of in person appointments. I also discussed with the patient that there may be a patient responsible charge related to this service. The patient expressed understanding and verbally consented to this telephonic visit.    Interactive audio and video telecommunications were attempted between this provider and patient, however failed, due to patient having technical difficulties OR patient did not have access to video capability.  We continued and completed visit with audio only.  Some vital signs may be absent or patient reported.   Time Spent with patient on telephone encounter: 40 minutes  Subjective:   Kayla Craig is a 85 y.o. female who presents for Medicare Annual (Subsequent) preventive examination.  Review of Systems     Cardiac Risk Factors include: advanced age (>69men, >51 women);diabetes mellitus;dyslipidemia;hypertension;family history of premature cardiovascular disease     Objective:    There were no vitals filed for this visit. There is no height or weight on file to calculate BMI.  Advanced Directives 03/30/2021 02/18/2020 01/28/2018 01/25/2017 12/14/2014 12/27/2012  Does Patient Have a Medical Advance Directive? Yes Yes Yes Yes Yes Patient does not have advance directive  Type of Advance Directive Living will Living will Healthcare Power of [place];Living will Healthcare Power of [place];Living will - -  Does patient want to make changes to medical advance directive? No - Patient declined No - Patient declined - - - -  Copy of Healthcare Power of Attorney in Chart? - - No - copy  requested No - copy requested Yes -  Pre-existing out of facility DNR order (yellow form or pink MOST form) - - - - - No    Current Medications (verified) Outpatient Encounter Medications as of 03/30/2021  Medication Sig   acetaminophen (TYLENOL) 500 MG tablet Take 500 mg by mouth every 6 (six) hours as needed.   amLODipine (NORVASC) 10 MG tablet Take 1 tablet by mouth once daily   aspirin 81 MG EC tablet Take 81 mg by mouth daily.   Calcium Carbonate-Vit D-Min (CALCIUM 1200 PO) Take by mouth.   carvedilol (COREG) 6.25 MG tablet Take 1 tablet by mouth twice daily   Cholecalciferol (VITAMIN D-3) 1000 UNITS CAPS Take 1 capsule by mouth daily.   famotidine (PEPCID) 40 MG tablet Take 1 tablet (40 mg total) by mouth 2 (two) times daily.   levothyroxine (SYNTHROID) 50 MCG tablet Take 1 tablet (50 mcg total) by mouth daily before breakfast.   losartan (COZAAR) 100 MG tablet Take 1 tablet by mouth once daily   metFORMIN (GLUCOPHAGE XR) 500 MG 24 hr tablet Take 1 tablet (500 mg total) by mouth daily with breakfast.   omeprazole (PRILOSEC) 20 MG capsule Take 1 capsule (20 mg total) by mouth daily.   Pirfenidone (ESBRIET) 267 MG TABS Take 3 tablets (801 mg total) by mouth with breakfast, with lunch, and with evening meal.   pravastatin (PRAVACHOL) 40 MG tablet Take 1 tablet (40 mg total) by mouth daily.   vitamin B-12 (CYANOCOBALAMIN) 1000 MCG tablet Take 1,000 mcg by mouth daily.   No facility-administered encounter medications on file as of 03/30/2021.    Allergies (verified) Codeine   History: Past Medical History:  Diagnosis Date   B12 deficiency    Back pain    Chronic   Goiter    Grief    Grief and loss related depression '08   Hypertension    TMJ (dislocation of temporomandibular joint)    Right Side   Vitamin D deficiency    Past Surgical History:  Procedure Laterality Date   EYE SURGERY  Sep 05, 2008   cataract surgery with implants   KNEE SURGERY     Right Arthroscopy '05 Dr  Charlann Boxer   THYROIDECTOMY     August '09 for goiter (Dr Jearld Fenton)   TUBAL LIGATION     Family History  Problem Relation Age of Onset   Cancer Mother    Heart disease Father        CAD/MI   Heart disease Brother        CAD/MI   Heart disease Brother        CAD/MI   Cancer Brother    Diabetes Neg Hx    Social History   Socioeconomic History   Marital status: Single    Spouse name: Not on file   Number of children: 2   Years of education: 12   Highest education level: Not on file  Occupational History   Occupation: retired    Associate Professor: RETIRED  Tobacco Use   Smoking status: Never   Smokeless tobacco: Never  Vaping Use   Vaping Use: Never used  Substance and Sexual Activity   Alcohol use: No    Alcohol/week: 0.0 standard drinks   Drug use: Never   Sexual activity: Never  Other Topics Concern   Not on file  Social History Narrative   HSG. [place]. 2 sons- Sep 05, 2060 (died lung cancer 09/12/22), 09/06/67; 3 grandchildren. 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 End of life issues: patient would want CPR, DNI, not to be maintained in a vegative state. Laymen's guide packet provided (july '11)   Social Determinants of Health   Financial Resource Strain: Low Risk    Difficulty of Paying Living Expenses: Not hard at all  Food Insecurity: No Food Insecurity   Worried About Programme researcher, broadcasting/film/video in the Last Year: Never true   Barista in the Last Year: Never true  Transportation Needs: No Transportation Needs   Lack of Transportation (Medical): No   Lack of Transportation (Non-Medical): No  Physical Activity: Inactive   Days of Exercise per Week: 0 days   Minutes of Exercise per Session: 0 min  Stress: No Stress Concern Present   Feeling of Stress : Not at all  Social Connections: Moderately Isolated   Frequency of Communication with Friends and Family: More than three times a week    Frequency of Social Gatherings with Friends and Family: More than three times a week   Attends Religious Services: Never   Database administrator or Organizations: Yes   Attends Banker Meetings: Never   Marital Status: Never married    Tobacco Counseling Counseling given: Not Answered   Clinical Intake:  Pre-visit preparation completed: Yes  Pain : No/denies pain     Nutritional Risks: None Diabetes: Yes CBG done?: No Did pt. bring in CBG monitor from home?: No  How often do you need to have someone help you when you read instructions, pamphlets, or other written materials from your doctor or pharmacy?: 1 - Never What is the last grade  level you completed in school?: High [employer] Graduate  Diabetic? yes  Interpreter Needed?: No  Information entered by :: Susie Cassette, LPN   Activities of Daily Living In your present state of health, do you have any difficulty performing the following activities: 03/30/2021  Hearing? N  Vision? N  Difficulty concentrating or making decisions? N  Walking or climbing stairs? N  Dressing or bathing? N  Doing errands, shopping? N  Preparing Food and eating ? N  Using the Toilet? N  In the past six months, have you accidently leaked urine? N  Do you have problems with loss of bowel control? N  Managing your Medications? N  Managing your Finances? N  Housekeeping or managing your Housekeeping? N  Some recent data might be hidden    Patient Care Team: Pincus Sanes, MD as PCP - General (Internal Medicine) Nelson Chimes, MD (Ophthalmology) Durene Romans, MD (Orthopedic Surgery) Erlene Quan Vinnie Level, [facility] as Pharmacist (Pharmacist)  Indicate any recent Medical Services you may have received from other than [facility] providers in the past year (date may be approximate).     Assessment:   This is a routine wellness examination for Kayla Craig.  Hearing/Vision screen Hearing Screening - Comments:: Patient denied any hearing  difficulty.   No hearing aids.  Vision Screening - Comments:: Patient wears corrective glasses/contacts.  Eye exam done annually by: [facility]  Dietary issues and exercise activities discussed: Current Exercise Habits: The patient does not participate in regular exercise at present, Exercise limited by: orthopedic condition(s)   Goals Addressed               This Visit's Progress     Patient Stated (pt-stated)        Patient declined health goal at this time.      Depression Screen PHQ 2/9 Scores 03/30/2021 02/18/2020 02/17/2019 01/28/2018 01/25/2017 01/10/2016 12/14/2014  PHQ - 2 Score 0 0 0 0 0 0 0  PHQ- 9 Score - - - - 1 - -    Fall Risk Fall Risk  03/30/2021 03/06/2021 02/26/2020 02/17/2019 01/28/2018  Falls in the past year? 1 0 0 1 No  Number falls in past yr: 0 0 0 0 -  Injury with Fall? 0 0 0 - -  Risk for fall due to : No Fall Risks No Fall Risks - - Impaired mobility  Follow up Falls prevention discussed - - - -    FALL RISK PREVENTION PERTAINING TO THE HOME:  Any stairs in or around the home? No  If so, are there any without handrails? No  Home free of loose throw rugs in walkways, pet beds, electrical cords, etc? Yes  Adequate lighting in your home to reduce risk of falls? Yes   ASSISTIVE DEVICES UTILIZED TO PREVENT FALLS:  Life alert? No  Use of a cane, walker or w/c? No  Grab bars in the bathroom? Yes  Shower chair or bench in shower? Yes  Elevated toilet seat or a handicapped toilet? Yes   TIMED UP AND GO:  Was the test performed? No .  Length of time to ambulate 10 feet: n/a sec.   Gait steady and fast without use of assistive device  Cognitive Function: Normal cognitive status assessed by direct observation by this Nurse Health Advisor. No abnormalities found.   MMSE - Mini Mental State Exam 01/28/2018 12/14/2014  Not completed: - (No Data)  Orientation to time 5 -  Orientation to Place 5 -  Registration 3 -  Attention/  Calculation 4 -  Recall 1 -  Language- name 2 objects 2 -  Language- repeat 1 -  Language- follow 3 step command 3 -  Language- read & follow direction 1 -  Write a sentence 1 -  Copy design 1 -  Total score 27 -        Immunizations Immunization History  Administered Date(s) Administered   Fluad Quad(high Dose 65+) 12/12/2018   Influenza Whole 01/31/2009, 02/28/2012   Influenza, High Dose Seasonal PF 01/06/2018, 12/12/2018, 12/03/2020   Influenza,inj,Quad PF,6+ Mos 01/11/2014   Influenza-Unspecified 01/29/2013, 12/22/2015, 12/08/2016, 01/09/2020   Moderna Sars-Covid-2 Vaccination 06/17/2019, 07/15/2019, 03/16/2020   Pneumococcal Conjugate-13 12/14/2014   Pneumococcal Polysaccharide-23 05/08/2012   Tetanus 05/08/2012    TDAP status: Due, Education has been provided regarding the importance of this vaccine. Advised may receive this vaccine at local pharmacy or Health Dept. Aware to provide a copy of the vaccination record if obtained from local pharmacy or Health Dept. Verbalized acceptance and understanding.  Flu Vaccine status: Up to date  Pneumococcal vaccine status: Up to date  Covid-19 vaccine status: Completed vaccines  Qualifies for Shingles Vaccine? Yes   Zostavax completed No   Shingrix Completed?: No.    Education has been provided regarding the importance of this vaccine. Patient has been advised to call insurance company to determine out of pocket expense if they have not yet received this vaccine. Advised may also receive vaccine at local pharmacy or Health Dept. Verbalized acceptance and understanding.  Screening Tests Health Maintenance  Topic Date Due   OPHTHALMOLOGY EXAM  Never done   DEXA SCAN  02/15/2006   COVID-19 Vaccine (4 - Booster for Moderna series) 05/11/2020   Zoster Vaccines- Shingrix (1 of 2) 06/06/2021 (Originally 10/20/1987)   HEMOGLOBIN A1C  09/03/2021   FOOT EXAM  12/07/2021   TETANUS/TDAP  05/08/2022   Pneumonia Vaccine 54+ Years old   Completed   INFLUENZA VACCINE  Completed   HPV VACCINES  Aged Out    Health Maintenance  Health Maintenance Due  Topic Date Due   OPHTHALMOLOGY EXAM  Never done   DEXA SCAN  02/15/2006   COVID-19 Vaccine (4 - Booster for Moderna series) 05/11/2020    Colorectal cancer screening: No longer required.   Mammogram status: Completed 05/27/2015. Repeat every year  Bone Density status: Ordered Scheduled for 08/10/2021. Pt provided with contact info and advised to call to schedule appt.  Lung Cancer Screening: (Low Dose CT Chest recommended if Age 25-80 years, 30 pack-year currently smoking OR have quit w/in 15years.) does not qualify.   Lung Cancer Screening Referral: no; already a patient with pulmonology  Additional Screening:  Hepatitis C Screening: does not qualify; Completed no  Vision Screening: Recommended annual ophthalmology exams for early detection of glaucoma and other disorders of the eye. Is the patient up to date with their annual eye exam?  Yes  Who is the provider or what is the name of the office in which the patient attends annual eye exams? [facility] If pt is not established with a provider, would they like to be referred to a provider to establish care? No .   Dental Screening: Recommended annual dental exams for proper oral hygiene  Community Resource Referral / Chronic Care Management: CRR required this visit?  No   CCM required this visit?  No      Plan:     I have personally reviewed and noted the following in the patients chart:  Medical and social history Use of alcohol, tobacco or illicit drugs  Current medications and supplements including opioid prescriptions.  Functional ability and status Nutritional status Physical activity Advanced directives List of other physicians Hospitalizations, surgeries, and ER visits in previous 12 months Vitals Screenings to include cognitive, depression, and falls Referrals and  appointments  In addition, I have reviewed and discussed with patient certain preventive protocols, quality metrics, and best practice recommendations. A written personalized care plan for preventive services as well as general preventive health recommendations were provided to patient.     Mickeal Needy, LPN   [id number]/[id number]   Nurse Notes:  Patient is cogitatively intact. There were no vitals filed for this visit. There is no height or weight on file to calculate BMI. Patient stated that she has no issues with gait or balance; does not use any assistive devices. Medications reviewed with patient; no opioid use noted.

## 2021-04-03 NOTE — Progress Notes (Signed)
Medical screening examination/treatment/procedure(s) were performed by non-physician practitioner and as supervising physician I was immediately available for consultation/collaboration.  I agree with above. Salil Raineri J Allye Hoyos, MD  

## 2021-04-09 ENCOUNTER — Other Ambulatory Visit: Payer: Self-pay | Admitting: Internal Medicine

## 2021-04-13 ENCOUNTER — Other Ambulatory Visit: Payer: Self-pay

## 2021-04-13 ENCOUNTER — Ambulatory Visit (HOSPITAL_COMMUNITY): Payer: HMO | Attending: Internal Medicine

## 2021-04-13 DIAGNOSIS — E785 Hyperlipidemia, unspecified: Secondary | ICD-10-CM | POA: Insufficient documentation

## 2021-04-13 DIAGNOSIS — E119 Type 2 diabetes mellitus without complications: Secondary | ICD-10-CM | POA: Insufficient documentation

## 2021-04-13 DIAGNOSIS — I10 Essential (primary) hypertension: Secondary | ICD-10-CM | POA: Insufficient documentation

## 2021-04-13 DIAGNOSIS — I288 Other diseases of pulmonary vessels: Secondary | ICD-10-CM | POA: Insufficient documentation

## 2021-04-13 DIAGNOSIS — I5030 Unspecified diastolic (congestive) heart failure: Secondary | ICD-10-CM

## 2021-04-13 LAB — ECHOCARDIOGRAM COMPLETE
Area-P 1/2: 3.6 cm2
P 1/2 time: 547 msec
S' Lateral: 3.4 cm

## 2021-04-17 ENCOUNTER — Other Ambulatory Visit (INDEPENDENT_AMBULATORY_CARE_PROVIDER_SITE_OTHER): Payer: HMO

## 2021-04-17 ENCOUNTER — Other Ambulatory Visit: Payer: Self-pay

## 2021-04-17 DIAGNOSIS — E1169 Type 2 diabetes mellitus with other specified complication: Secondary | ICD-10-CM

## 2021-04-17 DIAGNOSIS — E785 Hyperlipidemia, unspecified: Secondary | ICD-10-CM | POA: Diagnosis not present

## 2021-04-17 LAB — HEPATIC FUNCTION PANEL
ALT: 13 U/L (ref 0–35)
AST: 17 U/L (ref 0–37)
Albumin: 4.3 g/dL (ref 3.5–5.2)
Alkaline Phosphatase: 48 U/L (ref 39–117)
Bilirubin, Direct: 0.1 mg/dL (ref 0.0–0.3)
Total Bilirubin: 0.4 mg/dL (ref 0.2–1.2)
Total Protein: 7.8 g/dL (ref 6.0–8.3)

## 2021-04-17 LAB — LDL CHOLESTEROL, DIRECT: Direct LDL: 122 mg/dL

## 2021-04-17 LAB — LIPID PANEL
Cholesterol: 217 mg/dL — ABNORMAL HIGH (ref 0–200)
HDL: 50.1 mg/dL (ref >39.00)
Total CHOL/HDL Ratio: 4
Triglycerides: 456 mg/dL — ABNORMAL HIGH (ref 0.0–149.0)

## 2021-04-18 ENCOUNTER — Ambulatory Visit: Payer: HMO | Admitting: Cardiovascular Disease

## 2021-04-18 ENCOUNTER — Encounter: Payer: Self-pay | Admitting: Cardiovascular Disease

## 2021-04-18 VITALS — BP 142/80 | HR 69 | Ht 64.0 in | Wt 155.0 lb

## 2021-04-18 DIAGNOSIS — I10 Essential (primary) hypertension: Secondary | ICD-10-CM | POA: Diagnosis not present

## 2021-04-18 DIAGNOSIS — I739 Peripheral vascular disease, unspecified: Secondary | ICD-10-CM | POA: Diagnosis not present

## 2021-04-18 DIAGNOSIS — E785 Hyperlipidemia, unspecified: Secondary | ICD-10-CM | POA: Diagnosis not present

## 2021-04-18 MED ORDER — ROSUVASTATIN CALCIUM 20 MG PO TABS: 20.0000 mg | ORAL_TABLET | Freq: Every day | ORAL | 3 refills | Status: DC

## 2021-04-18 NOTE — Patient Instructions (Signed)
Medication Instructions:  STOP the Pravastatin  START Rosuvastatin 20 mg once daily  *If you need a refill on your cardiac medications before your next appointment, please call your pharmacy*   Lab Work: Your provider would like for you to return in 2 months to have the following labs drawn: Fasting Lipid and Liver. You do not need an appointment for the lab. Once in our office lobby there is a podium where you can sign in and ring the doorbell to alert [place] that you are here. The lab is open from 8:00 am to 4:30 pm; closed for lunch from 12:45pm-1:45pm.  If you have labs (blood work) drawn today and your tests are completely normal, you will receive your results only by: MyChart Message (if you have MyChart) OR A paper copy in the mail If you have any lab test that is abnormal or we need to change your treatment, we will call you to review the results.   Testing/Procedures: None ordered   Follow-Up: At [facility], you and your health needs are our priority.  As part of our continuing mission to provide you with exceptional heart care, we have created designated Provider Care Teams.  These Care Teams include your primary Cardiologist (physician) and Advanced Practice Providers (APPs -  Physician Assistants and Nurse Practitioners) who all work together to provide you with the care you need, when you need it.  We recommend signing up for the patient portal called "MyChart".  Sign up information is provided on this After Visit Summary.  MyChart is used to connect with patients for Virtual Visits (Telemedicine).  Patients are able to view lab/test results, encounter notes, upcoming appointments, etc.  Non-urgent messages can be sent to your provider as well.   To learn more about what you can do with MyChart, go to [email or web address].    Your next appointment:   6 month(s)  The format for your next appointment:   In Person  Provider:   Lorine Bears, MD

## 2021-04-18 NOTE — Progress Notes (Signed)
Cardiology Office Note   Date:  04/18/2021   ID:  Kayla Craig, DOB Mar 30, 1936, MRN [id number]  PCP:  Pincus Sanes, MD  Cardiologist:   Lorine Bears, MD   Chief Complaint  Patient presents with   Follow-up    6 months.   Edema    Feet.       History of Present Illness: Kayla Craig is a 86 y.o. female who is here today for follow-up visit regarding peripheral arterial disease. She has no prior cardiac history.  She has multiple chronic medical conditions including essential hypertension, type 2 diabetes, hyperlipidemia, GERD and osteoarthritis.  She suffers from hammertoes on both sides but with significant discomfort affecting the right second toe with some discoloration.  She was initially planning to have surgery done and was referred for vascular evaluation before that. She underwent noninvasive vascular evaluation which showed an ABI of 0.78 on the right and 0.65 on the left. Duplex showed severe stenosis in the right popliteal artery with occluded posterior tibial artery.  On the left, there was also severe stenosis in the popliteal artery extending into the TP trunk with occluded posterior tibial artery.  During last visit, she complained of persistent dry cough and thus she was referred for chest x-ray that was abnormal.  She followed with pulmonary and was diagnosed with pulmonary fibrosis based on the CT results.  She reports resolution of the cough and shortness of breath since December.  She still denies lower extremity claudication.  Past Medical History:  Diagnosis Date   B12 deficiency    Back pain    Chronic   Goiter    Grief    Grief and loss related depression '08   Hypertension    TMJ (dislocation of temporomandibular joint)    Right Side   Vitamin D deficiency     Past Surgical History:  Procedure Laterality Date   EYE SURGERY  2010   cataract surgery with implants   KNEE SURGERY     Right Arthroscopy '05 Dr Charlann Boxer   THYROIDECTOMY      August '09 for goiter (Dr Jearld Fenton)   TUBAL LIGATION       Current Outpatient Medications  Medication Sig Dispense Refill   acetaminophen (TYLENOL) 500 MG tablet Take 500 mg by mouth every 6 (six) hours as needed.     amLODipine (NORVASC) 10 MG tablet Take 1 tablet by mouth once daily 90 tablet 1   aspirin 81 MG EC tablet Take 81 mg by mouth daily.     Calcium Carbonate-Vit D-Min (CALCIUM 1200 PO) Take by mouth.     carvedilol (COREG) 6.25 MG tablet Take 1 tablet by mouth twice daily 180 tablet 0   Cholecalciferol (VITAMIN D-3) 1000 UNITS CAPS Take 1 capsule by mouth daily.     famotidine (PEPCID) 40 MG tablet Take 1 tablet (40 mg total) by mouth 2 (two) times daily. 180 tablet 2   levothyroxine (SYNTHROID) 50 MCG tablet Take 1 tablet (50 mcg total) by mouth daily before breakfast. 90 tablet 1   losartan (COZAAR) 100 MG tablet Take 1 tablet by mouth once daily 90 tablet 0   metFORMIN (GLUCOPHAGE XR) 500 MG 24 hr tablet Take 1 tablet (500 mg total) by mouth daily with breakfast. 90 tablet 3   omeprazole (PRILOSEC) 20 MG capsule Take 1 capsule (20 mg total) by mouth daily. 30 capsule 3   Pirfenidone (ESBRIET) 267 MG TABS Take 3 tablets (801 mg total) by  mouth with breakfast, with lunch, and with evening meal. 270 tablet 1   pravastatin (PRAVACHOL) 40 MG tablet Take 1 tablet (40 mg total) by mouth daily. 90 tablet 2   vitamin B-12 (CYANOCOBALAMIN) 1000 MCG tablet Take 1,000 mcg by mouth daily.     No current facility-administered medications for this visit.    Allergies:   Codeine    Social History:  The patient  reports that she has never smoked. She has never used smokeless tobacco. She reports that she does not drink alcohol and does not use drugs.   Family History:  The patient's family history includes Cancer in her brother and mother; Heart disease in her brother, brother, and father.    ROS:  Please see the history of present illness.   Otherwise, review of systems are positive for  none.   All other systems are reviewed and negative.    PHYSICAL EXAM: VS:  BP (!) 142/80 (BP Location: Left Arm, Patient Position: Sitting, Cuff Size: Normal)    Pulse 69    Ht 5\' 4"  (1.626 m)    Wt 155 lb (70.3 kg)    BMI 26.61 kg/m  , BMI Body mass index is 26.61 kg/m. GEN: Well nourished, well developed, in no acute distress  HEENT: normal  Neck: no JVD, carotid bruits, or masses Cardiac: RRR; no murmurs, rubs, or gallops,no edema  Respiratory:  clear to auscultation bilaterally, normal work of breathing GI: soft, nontender, nondistended, + BS MS: no deformity or atrophy  Skin: warm and dry, no rash Neuro:  Strength and sensation are intact Psych: euthymic mood, full affect Vascular: Radial pulses normal bilaterally.  Posterior tibial is not palpable.  Dorsalis pedis is +1 bilaterally.   EKG:  EKG is ordered today. EKG showed sinus rhythm with first-degree AV block, LVH with repolarization abnormalities.   Recent Labs: 03/06/2021: BUN 19; Creatinine, Ser 1.14; Hemoglobin 13.1; Platelets 290.0; Potassium 4.2; Sodium 133; TSH 2.06 04/17/2021: ALT 13    Lipid Panel    Component Value Date/Time   CHOL 217 (H) 04/17/2021 1313   TRIG (H) 04/17/2021 1313    456.0 Triglyceride is over 400; calculations on Lipids are invalid.   HDL 50.10 04/17/2021 1313   CHOLHDL 4 04/17/2021 1313   VLDL 60.2 (H) 03/06/2021 1447   LDLDIRECT 122.0 04/17/2021 1313      Wt Readings from Last 3 Encounters:  04/18/21 155 lb (70.3 kg)  03/08/21 156 lb 9.6 oz (71 kg)  03/06/21 156 lb 6.4 oz (70.9 kg)      No flowsheet data found.    ASSESSMENT AND PLAN:  1.  Peripheral arterial disease: The patient has evidence of severe popliteal artery stenosis bilaterally slightly worse on the left side given the extension into the TP trunk.  Currently with no symptoms.  No claudication or ulceration.  Recommend continuing medical therapy.  2.  Essential hypertension: Blood pressure is reasonably  controlled on current medications.  3.  Hyperlipidemia: Currently on pravastatin .  However, lipid profile from yesterday showed an LDL of 122 and triglyceride of 456.  Thus, I elected to switch her from pravastatin to rosuvastatin 20 mg daily.  Recheck lipid and liver profile in 2 months.  4.  Pulmonary fibrosis: Followed by pulmonary.     Disposition:   FU with me in 6 months  Signed,  Kathlyn Sacramento, MD  04/18/2021 11:34 AM    [place]

## 2021-04-20 ENCOUNTER — Encounter: Payer: Self-pay | Admitting: Internal Medicine

## 2021-04-20 DIAGNOSIS — I739 Peripheral vascular disease, unspecified: Secondary | ICD-10-CM | POA: Insufficient documentation

## 2021-05-04 ENCOUNTER — Ambulatory Visit (INDEPENDENT_AMBULATORY_CARE_PROVIDER_SITE_OTHER): Payer: HMO

## 2021-05-04 DIAGNOSIS — E785 Hyperlipidemia, unspecified: Secondary | ICD-10-CM

## 2021-05-04 DIAGNOSIS — E039 Hypothyroidism, unspecified: Secondary | ICD-10-CM

## 2021-05-04 DIAGNOSIS — I10 Essential (primary) hypertension: Secondary | ICD-10-CM

## 2021-05-04 DIAGNOSIS — E118 Type 2 diabetes mellitus with unspecified complications: Secondary | ICD-10-CM

## 2021-05-04 DIAGNOSIS — E1169 Type 2 diabetes mellitus with other specified complication: Secondary | ICD-10-CM

## 2021-05-04 NOTE — Patient Instructions (Signed)
Visit Information  Following are the goals we discussed today:   Track and Manage My Blood Pressure   Timeframe:  Long-Range Goal Priority:  High Start Date:  09/14/2020                           Expected End Date:  05/04/2022                     Follow Up Date 11/01/2021   - check blood pressure weekly - record blood pressures and present with next appointment     Why is this important?   You won't feel high blood pressure, but it can still hurt your blood vessels.  High blood pressure can cause heart or kidney problems. It can also cause a stroke.  Making lifestyle changes like losing a little weight or eating less salt will help.  Checking your blood pressure at home and at different times of the day can help to control blood pressure.  If the doctor prescribes medicine remember to take it the way the doctor ordered.  Call the office if you cannot afford the medicine or if there are questions about it.     Notes: Patient will purchase blood pressure cuff at pharmacy to be able to start recording blood pressure   Plan: Telephone follow up appointment with care management team member scheduled for:  6 months  The patient has been provided with contact information for the care management team and has been advised to call with any health related questions or concerns.   Ellin Saba, PharmD Clinical Pharmacist, [facility]   Please call the care guide team at [phone] if you need to cancel or reschedule your appointment.   The patient verbalized understanding of instructions, educational materials, and care plan provided today and agreed to receive a mailed copy of patient instructions, educational materials, and care plan.

## 2021-05-04 NOTE — Progress Notes (Signed)
Chronic Care Management Pharmacy Note  05/04/2021 Name:  Kayla Craig MRN:  [id number] DOB:  1935-09-03   Summary: -Patient reports that she has stopped taking Esbriet at Christmas time due to it causing her to feel dizzy - has not make pulmonology aware  -Recently started on rosuvastatin (replaced pravastatin) - patient confirms adherence, no issues since starting (04/18/21) -Reports that GERD is controlled on famotidine 75m daily typically - does have additional famotidine tablet and omeprazole she can take if needed  -A1c remains stable and at goal -Not currently checking BP as she does not have a BP cuff, plans to purchase later this month to be able to start checking BP  Recommendations/Changes made from today's visit: -Advised for patient to reach out to pulmonology to discuss esbriet - has appointment scheduled for follow up in 3 months  -Patient to start monitoring blood pressure 2-3 times weekly - to reach out should BG average >140/90 -No changes to current medications, aware she can reach out with any medication issues or concerns   Subjective: Kayla LEASis an 86y.o. year old female who is a primary patient of Burns, SClaudina Lick MD.  The CCM team was consulted for assistance with disease management and care coordination needs.    Engaged with patient by telephone for follow up visit in response to provider referral for pharmacy case management and/or care coordination services.   Consent to Services:  The patient was given the following information about Chronic Care Management services today, agreed to services, and gave verbal consent: 1. CCM service includes personalized support from designated clinical staff supervised by the primary care provider, including individualized plan of care and coordination with other care providers 2. 24/7 contact phone numbers for assistance for urgent and routine care needs. 3. Service will only be billed when office clinical staff spend  20 minutes or more in a month to coordinate care. 4. Only one practitioner may furnish and bill the service in a calendar month. 5.The patient may stop CCM services at any time (effective at the end of the month) by phone call to the office staff. 6. The patient will be responsible for cost sharing (co-pay) of up to 20% of the service fee (after annual deductible is met). Patient agreed to services and consent obtained.  Patient Care Team: BBinnie Rail MD as PCP - General (Internal Medicine) DCalvert Cantor MD (Ophthalmology) OParalee Cancel MD (Orthopedic Surgery) SDelice BisonDDarnelle Maffucci REn[facility]son[facility] Pharmacist (Pharmacist)  Recent office visits: 03/06/2021 - Dr. BQuay Burow- increase pepcid to 417mBID  - DEXA scan ordered - follow up in 6 months   Recent consult visits: 04/18/2021 - Dr. ArFletcher Anon Cardiology - switched from pravastatin to rosuvastatin  - recheck lipids and liver profile in 2 months  03/08/2021 - Dr. MaVaughan Browner Pulmonology - imaging stable - continue monitoring - follow up in 3 months   Hospital visits: None in previous 6 months  Objective:  Lab Results  Component Value Date   CREATININE 1.14 03/06/2021   BUN 19 03/06/2021   GFR 44.56 (L) 03/06/2021   GFRNONAA 83 (L) 12/27/2012   GFRAA >90 12/27/2012   NA 133 (L) 03/06/2021   K 4.2 03/06/2021   CALCIUM 10.1 03/06/2021   CO2 25 03/06/2021   GLUCOSE 89 03/06/2021    Lab Results  Component Value Date/Time   HGBA1C 5.9 03/06/2021 02:47 PM   HGBA1C 6.2 12/08/2020 03:02 PM   GFR 44.56 (L) 03/06/2021 02:47  PM   GFR 42.39 (L) 12/08/2020 03:02 PM   MICROALBUR 3.4 (H) 02/26/2020 02:17 PM    Last diabetic Eye exam:  No results found for: HMDIABEYEEXA  Last diabetic Foot exam:  No results found for: HMDIABFOOTEX   Lab Results  Component Value Date   CHOL 217 (H) 04/17/2021   HDL 50.10 04/17/2021   LDLDIRECT 122.0 04/17/2021   TRIG (H) 04/17/2021    456.0 Triglyceride is over 400; calculations on Lipids are invalid.   CHOLHDL 4  04/17/2021    Hepatic Function Latest Ref Rng & Units 04/17/2021 03/06/2021 12/08/2020  Total Protein 6.0 - 8.3 g/dL 7.8 8.2 7.6  Albumin 3.5 - 5.2 g/dL 4.3 4.6 4.3  AST 0 - 37 U/L _0 ALT 0 - 35 U/L _1 Alk Phosphatase 39 - 117 U/L 48 49 53  Total Bilirubin 0.2 - 1.2 mg/dL 0.4 0.4 0.6  Bilirubin, Direct 0.0 - 0.3 mg/dL 0.1 - -    Lab Results  Component Value Date/Time   TSH 2.06 03/06/2021 02:47 PM   TSH 3.02 12/08/2020 03:02 PM   FREET4 0.81 02/17/2019 01:25 PM   FREET4 0.75 02/12/2018 10:48 AM    CBC Latest Ref Rng & Units 03/06/2021 12/08/2020 02/26/2020  WBC 4.0 - 10.5 K/uL 7.3 8.4 8.3  Hemoglobin 12.0 - 15.0 g/dL 13.1 12.3 13.3  Hematocrit 36.0 - 46.0 % 39.4 37.2 39.9  Platelets 150.0 - 400.0 K/uL 290.0 259.0 272.0    Lab Results  Component Value Date/Time   VD25OH 36.02 03/06/2021 02:47 PM   VD25OH 33.46 12/08/2020 03:02 PM    Clinical ASCVD: No  The ASCVD Risk score (Arnett DK, et al., 2019) failed to calculate for the following reasons:   The 2019 ASCVD risk score is only valid for ages 51 to 48    Depression screen PHQ 2/9 03/30/2021 02/18/2020 02/17/2019  Decreased Interest 0 0 0  Down, Depressed, Hopeless 0 0 0  PHQ - 2 Score 0 0 0  Altered sleeping - - -  Tired, decreased energy - - -  Change in appetite - - -  Feeling bad or failure about yourself  - - -  Trouble concentrating - - -  Moving slowly or fidgety/restless - - -  Suicidal thoughts - - -  PHQ-9 Score - - -  Difficult doing work/chores - - -     Social History   Tobacco Use  Smoking Status Never  Smokeless Tobacco Never   BP Readings from Last 3 Encounters:  04/18/21 (!) 142/80  03/08/21 130/68  03/06/21 (!) 142/78   Pulse Readings from Last 3 Encounters:  04/18/21 69  03/08/21 80  03/06/21 69   Wt Readings from Last 3 Encounters:  04/18/21 155 lb (70.3 kg)  03/08/21 156 lb 9.6 oz (71 kg)  03/06/21 156 lb 6.4 oz (70.9 kg)   BMI Readings from Last 3 Encounters:   04/18/21 26.61 kg/m  03/08/21 26.88 kg/m  03/06/21 27.27 kg/m    Assessment/Interventions: Review of patient past medical history, allergies, medications, health status, including review of consultants reports, laboratory and other test data, was performed as part of comprehensive evaluation and provision of chronic care management services.   SDOH:  (Social Determinants of Health) assessments and interventions performed: Yes  SDOH Screenings   Alcohol Screen: Low Risk    Last Alcohol Screening Score (AUDIT): 0  Depression (PHQ2-9): Low Risk    PHQ-2 Score: 0  Financial Resource Strain: Low  Risk    Difficulty of Paying Living Expenses: Not hard at all  Food Insecurity: No Food Insecurity   Worried About Charity fundraiser in the Last Year: Never true   Ran Out of Food in the Last Year: Never true  Housing: Low Risk    Last Housing Risk Score: 0  Physical Activity: Inactive   Days of Exercise per Week: 0 days   Minutes of Exercise per Session: 0 min  Social Connections: Moderately Isolated   Frequency of Communication with Friends and Family: More than three times a week   Frequency of Social Gatherings with Friends and Family: More than three times a week   Attends Religious Services: Never   Marine scientist or Organizations: Yes   Attends Archivist Meetings: Never   Marital Status: Never married  Stress: No Stress Concern Present   Feeling of Stress : Not at all  Tobacco Use: Low Risk    Smoking Tobacco Use: Never   Smokeless Tobacco Use: Never   Passive Exposure: Not on file  Transportation Needs: No Transportation Needs   Lack of Transportation (Medical): No   Lack of Transportation (Non-Medical): No    CCM Care Plan  Allergies  Allergen Reactions   Codeine Nausea And Vomiting    Dizzy    Medications Reviewed Today     Reviewed by [name], [facility] (Pharmacist) on 05/04/21 at 1107  Med List Status: <None>   Medication Order Taking?  Sig Documenting Provider Last Dose Status Informant  acetaminophen (TYLENOL) 500 MG tablet [id number] Yes Take 500 mg by mouth every 6 (six) hours as needed. [provider] Taking Active   amLODipine (NORVASC) 10 MG tablet [id number] Yes Take 1 tablet by mouth once daily Binnie Rail, MD Taking Active   aspirin 81 MG EC tablet [id number] Yes Take 81 mg by mouth daily. [provider] Taking Active   Calcium Carbonate-Vit D-Min (CALCIUM 1200 PO) [id number] Yes Take by mouth. [provider] Taking Active   carvedilol (COREG) 6.25 MG tablet [id number] Yes Take 1 tablet by mouth twice daily Wellington Hampshire, MD Taking Active   cholecalciferol (VITAMIN D3) 25 MCG (1000 UNIT) tablet [id number] Yes Take 1,000 Units by mouth daily. [provider] Taking Active   famotidine (PEPCID) 40 MG tablet [id number] Yes Take 1 tablet (40 mg total) by mouth 2 (two) times daily.  Patient taking differently: Take 40 mg by mouth daily as needed.   Binnie Rail, MD Taking Active   levothyroxine (SYNTHROID) 50 MCG tablet [id number] Yes Take 1 tablet (50 mcg total) by mouth daily before breakfast. Binnie Rail, MD Taking Active   losartan (COZAAR) 100 MG tablet [id number] Yes Take 1 tablet by mouth once daily Burns, Claudina Lick, MD Taking Active   metFORMIN (GLUCOPHAGE XR) 500 MG 24 hr tablet [id number] Yes Take 1 tablet (500 mg total) by mouth daily with breakfast. Binnie Rail, MD Taking Active   omeprazole (PRILOSEC) 20 MG capsule [id number] No Take 1 capsule (20 mg total) by mouth daily.  Patient not taking: Reported on 05/04/2021   Binnie Rail, MD Not Taking Active   Pirfenidone (ESBRIET) 267 MG TABS [id number] No Take 3 tablets (801 mg total) by mouth with breakfast, with lunch, and with evening meal.  Patient not taking: Reported on 05/04/2021   Marshell Garfinkel, MD Not Taking Active   rosuvastatin (CRESTOR) 20 MG tablet [id number] Yes Take 1 tablet (20 mg total)  by mouth daily. Wellington Hampshire, MD Taking Active   vitamin B-12 (CYANOCOBALAMIN) 1000 MCG tablet [id number] Yes Take 1,000 mcg by mouth daily. [provider] Taking Active             Patient Active Problem List   Diagnosis Date Noted   PAD (peripheral artery disease) ([place]) 04/20/2021   Chronic low back pain 03/06/2021   Osteopenia 03/05/2021   Positive ANA (antinuclear antibody) 01/30/2021   IPF (idiopathic pulmonary fibrosis) ([place]) 12/07/2020   Foot pain, right 12/07/2020   Cough 11/01/2020   Abnormal chest x-ray 10/31/2020   Diabetes mellitus type 2 with complications ([place]) 47/82/9562   Primary osteoarthritis involving multiple joints 02/17/2019   Hyperlipidemia associated with type 2 diabetes mellitus ([place]) 10/20/2009   GERD 03/25/2008   Back pain 10/03/2007   Hypothyroidism 02/05/2007   B12 deficiency 02/05/2007   Vitamin D deficiency 02/05/2007   Essential hypertension, benign 02/05/2007    Immunization History  Administered Date(s) Administered   Fluad Quad(high Dose 65+) 12/12/2018   Influenza Whole 01/31/2009, 02/28/2012   Influenza, High Dose Seasonal PF 01/06/2018, 12/12/2018, 12/03/2020   Influenza,inj,Quad PF,6+ Mos 01/11/2014   Influenza-Unspecified 01/29/2013, 12/22/2015, 12/08/2016, 01/09/2020   Moderna Sars-Covid-2 Vaccination 06/17/2019, 07/15/2019, 03/16/2020   Pneumococcal Conjugate-13 12/14/2014   Pneumococcal Polysaccharide-23 05/08/2012   Tetanus 05/08/2012    Conditions to be addressed/monitored:  Hypertension, Hyperlipidemia, Diabetes, Chronic Kidney Disease and Allergic Rhinitis  Care Plan : CCM Care Plan  Updates made by [name], RPH since 05/04/2021 12:00 AM     Problem: HTN, HLD, DM2, Hypothyroidism, GERD, Allergic Rhinitis   Priority: High  Onset Date: 09/14/2020     Long-Range Goal: Disease Management   Start Date: 09/14/2020  Expected End Date: 05/04/2022  This Visit's Progress: On track  Recent Progress: On track  Priority: High   Note:   Current Barriers:  Unable to independently monitor therapeutic efficacy  Pharmacist Clinical Goal(s):  Patient will verbalize ability to afford treatment regimen achieve adherence to monitoring guidelines and medication adherence to achieve therapeutic efficacy maintain control of Blood pressure and Blood sugars  as evidenced by blood pressure logs and A1c with next labs   through collaboration with PharmD and provider.   Interventions: 1:1 collaboration with Binnie Rail, MD regarding development and update of comprehensive plan of care as evidenced by provider attestation and co-signature Inter-disciplinary care team collaboration (see longitudinal plan of care) Comprehensive medication review performed; medication list updated in electronic medical record  Hypertension (BP goal <140/90) -Controlled -Current treatment: Carvedilol 6.17m twice daily  Losartan 1029mdaily  Amlodipine 1069maily  -Medications previously tried: atenolol, nebivolol, lisinopril, HCTZ, furosemide  -Current home readings: n/a - patient has not yet purchased BP cuff - not checking at home  BP Readings from Last 3 Encounters:  04/18/21 (!) 142/80  03/08/21 130/68  03/06/21 (!) 142/78  -Current dietary habits: does not salt her food, uses only sodium reduced foods, drinks decaf coffee if any at all -Current exercise habits: limited due to pain in her hips/legs -Denies hypotensive/hypertensive symptoms -Educated on BP goals and benefits of medications for prevention of heart attack, stroke and kidney damage; Daily salt intake goal < 2300 mg; Importance of home blood pressure monitoring; -Counseled to monitor BP at home 2-3 times weekly, document, and provide log at future appointments -Counseled on diet and exercise extensively Recommended to continue current medication  Hyperlipidemia: (LDL goal < 70) -Not ideally controlled -switched to rosuvastatin from pravastatin with  last cardiology  appointment  -Last LDL: 122 -Current treatment: Rosuvastatin 41m daily  ASA 821mdaily  -Medications previously tried: Tricor, lovaza, lovastatin , pravastatin   -Current dietary patterns: reports that she does eat bacon for breakfast on occasion, but does not eat much fried/ fatty foods aside from that  -Current exercise habits: limited due to pain in hips/legs  -Educated on Cholesterol goals;  Benefits of statin for ASCVD risk reduction; Importance of limiting foods high in cholesterol; -Counseled on diet and exercise extensively Recommended to continue current medication - follow up with cardiology as planned for lipid panel and liver function tests   Diabetes (A1c goal <7%) -Controlled  Lab Results  Component Value Date   HGBA1C 5.9 03/06/2021  - Last GFR 44.56 mL/min -Current medications: Metformin XR 50058m 1 tablet daily  -Medications previously tried: n/a  -Denies hypoglycemic/hyperglycemic symptoms -Current meal patterns:  breakfast: fruit, toast, eggs, bacon  lunch: salad / sandwich   dinner: potatoes, chicken, vegetables  snacks: peanutbutter and crackers / popcorn drinks: water, sugar free powerade, diet cheerwine  -Current exercise: limited due to pain/ cramps in her legs  -Educated on A1c and blood sugar goals; Complications of diabetes including kidney damage, retinal damage, and cardiovascular disease; Benefits of weight loss; -Counseled to check feet daily and get yearly eye exams -Recommended for patient to continue current medications  GERD (Goal: acid control / prevention of reflux ) -Controlled -Current treatment  Famotidine 27m64mD  - typically taking once daily  Omeprazole 20mg50mly  - not currently taking  -Medications previously tried: ranitidine   -Recommending no changes to medications at this time   Hypothyroidism (Goal: Maintenance of euthyroid levels) -Controlled  Lab Results  Component Value Date   TSH 2.06 03/06/2021  -Current  treatment  Levothyroxine 50mcg86mly  -Medications previously tried: synthroid  -Recommended to continue current medication   UIP pattern Pulmonary Fibrosis (Goal: Prevention of disease progression / management of symptoms) - following with Pulmonology -Current treatment  Esbriet 267mg- 67mblets 3 times daily  - not currently taking  -Medications previously tried: n/a  -Patient did not wish to restart Esbriet - has been >14 days discontinuing (reports stopping around Christmas) - would need to be retitrated if restarted - advised for patient to reach out to pulmonology to discuss - patient agreeable    Health Maintenance -Current therapy:  Calcium Carbonate-Vit D3 - 1200 units - 1 tablet daily  Vit D3  - 1000 units daily  Vit B-12 1000mcg d70m  Acetaminophen 500mg - 129mlet every 6 hours as needed  -Educated on Cost vs benefit of each product must be carefully weighed by individual consumer -Patient is satisfied with current therapy and denies issues -Recommended to continue current medication  Patient Goals/Self-Care Activities Patient will:  - take medications as prescribed check blood pressure 2-3 times weekly, document, and provide at future appointments  Follow Up Plan: Telephone follow up appointment with care management team member scheduled for: 6 months  The patient has been provided with contact information for the care management team and has been advised to call with any health related questions or concerns.     Medication Assistance: None required.  Patient affirms current coverage meets needs.  [phone]  [place] [address] Phone: [phone] Fax: [phone]  Uses pill box? No - feels she can manage without  Pt  endorses 100% compliance  Care Plan and Follow Up Patient Decision:  Patient agrees to Care Plan and Follow-up.  Plan: Telephone follow up appointment with care management team member scheduled for:  6 months, The patient has been provided with contact information for the care management team and has been advised to call with any health related questions or concerns  [name], PharmD Clinical Pharmacist, [place]

## 2021-05-09 DIAGNOSIS — E118 Type 2 diabetes mellitus with unspecified complications: Secondary | ICD-10-CM | POA: Diagnosis not present

## 2021-05-09 DIAGNOSIS — E039 Hypothyroidism, unspecified: Secondary | ICD-10-CM | POA: Diagnosis not present

## 2021-05-09 DIAGNOSIS — E1169 Type 2 diabetes mellitus with other specified complication: Secondary | ICD-10-CM | POA: Diagnosis not present

## 2021-05-09 DIAGNOSIS — I10 Essential (primary) hypertension: Secondary | ICD-10-CM

## 2021-05-09 DIAGNOSIS — E785 Hyperlipidemia, unspecified: Secondary | ICD-10-CM

## 2021-05-15 ENCOUNTER — Other Ambulatory Visit: Payer: Self-pay | Admitting: Cardiovascular Disease

## 2021-05-15 NOTE — Telephone Encounter (Signed)
Refill Request.  

## 2021-05-23 ENCOUNTER — Ambulatory Visit: Payer: HMO | Admitting: Pulmonary Disease

## 2021-05-24 DIAGNOSIS — N959 Unspecified menopausal and perimenopausal disorder: Secondary | ICD-10-CM | POA: Diagnosis not present

## 2021-05-30 ENCOUNTER — Encounter: Payer: Self-pay | Admitting: Pulmonary Disease

## 2021-05-30 ENCOUNTER — Ambulatory Visit: Payer: HMO | Admitting: Pulmonary Disease

## 2021-05-30 ENCOUNTER — Other Ambulatory Visit: Payer: Self-pay

## 2021-05-30 VITALS — BP 138/68 | HR 76 | Temp 98.6°F | Ht 64.0 in | Wt 153.8 lb

## 2021-05-30 DIAGNOSIS — J84112 Idiopathic pulmonary fibrosis: Secondary | ICD-10-CM | POA: Diagnosis not present

## 2021-05-30 DIAGNOSIS — R0600 Dyspnea, unspecified: Secondary | ICD-10-CM | POA: Diagnosis not present

## 2021-05-30 DIAGNOSIS — Z5181 Encounter for therapeutic drug level monitoring: Secondary | ICD-10-CM

## 2021-05-30 DIAGNOSIS — I272 Pulmonary hypertension, unspecified: Secondary | ICD-10-CM | POA: Diagnosis not present

## 2021-05-30 LAB — COMPREHENSIVE METABOLIC PANEL
ALT: 15 U/L (ref 0–35)
AST: 17 U/L (ref 0–37)
Albumin: 4.4 g/dL (ref 3.5–5.2)
Alkaline Phosphatase: 40 U/L (ref 39–117)
BUN: 18 mg/dL (ref 6–23)
CO2: 33 mEq/L — ABNORMAL HIGH (ref 19–32)
Calcium: 10.2 mg/dL (ref 8.4–10.5)
Chloride: 102 mEq/L (ref 96–112)
Creatinine, Ser: 0.96 mg/dL (ref 0.40–1.20)
GFR: 54.67 mL/min — ABNORMAL LOW (ref >60.00)
Glucose, Bld: 133 mg/dL — ABNORMAL HIGH (ref 70–99)
Potassium: 4.4 mEq/L (ref 3.5–5.1)
Sodium: 137 mEq/L (ref 135–145)
Total Bilirubin: 0.5 mg/dL (ref 0.2–1.2)
Total Protein: 8 g/dL (ref 6.0–8.3)

## 2021-05-30 LAB — HEPATIC FUNCTION PANEL
ALT: 15 U/L (ref 0–35)
AST: 17 U/L (ref 0–37)
Albumin: 4.4 g/dL (ref 3.5–5.2)
Alkaline Phosphatase: 40 U/L (ref 39–117)
Bilirubin, Direct: 0.1 mg/dL (ref 0.0–0.3)
Total Bilirubin: 0.5 mg/dL (ref 0.2–1.2)
Total Protein: 8 g/dL (ref 6.0–8.3)

## 2021-05-30 NOTE — Patient Instructions (Signed)
We will stop the Esbriet and start Ofev Check comprehensive metabolic panel, proBNP for dyspnea  Follow-up in 1 to 2 months

## 2021-05-30 NOTE — Addendum Note (Signed)
Addended by: Elton Sin on: 05/30/2021 10:36 AM   Modules accepted: Orders

## 2021-05-30 NOTE — Progress Notes (Signed)
Kayla Craig    OF:[id number]    1936/03/13  Primary Care Physician:Burns, Claudina Lick, MD  Referring Physician: Binnie Rail, MD [address] [place],  [place] [address]  Chief complaint: Follow-up for interstitial lung disease  HPI: 86 year old with history of hypertension, diabetes, hyperlipidemia, allergies, GERD on PPI, peripheral artery disease She has a dry nonproductive cough since March 2022.  No associated dyspnea fevers or chills.  Given Z-Pak by primary care in July 2022 Chest x-ray and follow-up CT showed interstitial lung disease and she has been referred to pulmonary for further evaluation  Has occasional joint pain.  No morning stiffness, difficulty swallowing, denies dry mouth, dry eyes  She has been evaluated by Dr. Benjamine Mola, rheumatology in 01/30/2021 for elevated ANA.  He did not feel that she had any connective tissue disease  Pets: No pets Occupation: Used to work in a Furniture conservator/restorer Exposures: Was exposed to acetone while working.  No ongoing exposures.  No mold, hot tub, Jacuzzi.  No feather pillows or comforters Smoking history: Never smoker Travel history: No significant travel history Relevant family history: No family history of lung disease  Interim history: Started on Esbriet in October 2022.  She took it for about a month and stopped due to weakness, fatigue, loss of weight and appetite.  She reports a couple of falls at home with syncope.  These occurred after stopping the medication.   Outpatient Encounter Medications as of 05/30/2021  Medication Sig   acetaminophen (TYLENOL) 500 MG tablet Take 500 mg by mouth every 6 (six) hours as needed.   amLODipine (NORVASC) 10 MG tablet Take 1 tablet by mouth once daily   aspirin 81 MG EC tablet Take 81 mg by mouth daily.   Calcium Carbonate-Vit D-Min (CALCIUM 1200 PO) Take by mouth.   carvedilol (COREG) 6.25 MG tablet Take 1 tablet by mouth twice daily   cholecalciferol (VITAMIN D3) 25 MCG (1000 UNIT)  tablet Take 1,000 Units by mouth daily.   famotidine (PEPCID) 40 MG tablet Take 1 tablet (40 mg total) by mouth 2 (two) times daily. (Patient taking differently: Take 40 mg by mouth daily as needed.)   levothyroxine (SYNTHROID) 50 MCG tablet Take 1 tablet (50 mcg total) by mouth daily before breakfast.   losartan (COZAAR) 100 MG tablet Take 1 tablet by mouth once daily   metFORMIN (GLUCOPHAGE XR) 500 MG 24 hr tablet Take 1 tablet (500 mg total) by mouth daily with breakfast.   rosuvastatin (CRESTOR) 20 MG tablet Take 1 tablet (20 mg total) by mouth daily.   vitamin B-12 (CYANOCOBALAMIN) 1000 MCG tablet Take 1,000 mcg by mouth daily.   omeprazole (PRILOSEC) 20 MG capsule Take 1 capsule (20 mg total) by mouth daily. (Patient not taking: Reported on 05/04/2021)   Pirfenidone (ESBRIET) 267 MG TABS Take 3 tablets (801 mg total) by mouth with breakfast, with lunch, and with evening meal. (Patient not taking: Reported on 05/04/2021)   No facility-administered encounter medications on file as of 05/30/2021.    Physical Exam: Blood pressure 138/68, pulse 76, temperature 98.6 F (37 C), temperature source Oral, height 5\' 4"  (1.626 m), weight 153 lb 12.8 oz (69.8 kg), SpO2 94 %. Gen:      No acute distress HEENT:  EOMI, sclera anicteric Neck:     No masses; no thyromegaly Lungs:    Bibasal crackles CV:         Regular rate and rhythm; no murmurs  Abd:      + bowel sounds; soft, non-tender; no palpable masses, no distension Ext:    No edema; adequate peripheral perfusion Skin:      Warm and dry; no rash Neuro: alert and oriented x 3 Psych: normal mood and affect   Data Reviewed: Imaging: CT chest 11/10/2020-peripheral reticulation, traction bronchiectasis with minimal honeycombing.  UIP pattern pulmonary fibrosis  High-res CT lung 02/07/21-stable pattern of UIP pattern pulmonary fibrosis.  Nodular opacity in the left lung has remained stable I have reviewed the images  personally  PFTs: 01/27/2021 FVC 1.55 (62%], FEV1 1.38 [95%], F/F 89, TLC 2.99 [59%], DLCO 10.13 [54%] Severe restriction, diffusion defect  Labs: CTD serologies-ANA 12/06/2020- 1:1280, nuclear speckled Myositis panel positive for PL 7  Cardiac: Echocardiogram 04/17/2021 LVEF 0000000, grade 1 diastolic dysfunction, mild reduction in RV systolic function, mild elevation in PA systolic pressure, estimated RVSP 36.4  Assessment:  IPF Has CT shows pattern of pulmonary fibrosis in UIP.  She does not have any exposures, signs and symptoms of connective tissue disease though ANA noted to be very high.  No evidence of connective tissue disease per rheumatology  He has not tolerated Esbriet.  We discussed alternatives today and have decided to try Ofev Check comprehensive metabolic panel  Mild pulmonary hypertension Echo reviewed with mild elevation in PA systolic pressure.  We discussed right heart catheterization but she does not want any invasive procedures at present.  Will reassess at return visit Check proBNP  Lung nodule There appears to be an area of nodular fibrosis at the left lung base which may be part of the fibrotic process.  This has remained stable on follow-up.  Continue monitoring.    Plan/Recommendations: Stop Esbriet, start Ofev Check comprehensive metabolic panel, proBNP for dyspnea Follow-up in 1 to 2 months  Marshell Garfinkel MD [facility] 05/30/2021, 10:13 AM  CC: Binnie Rail, MD

## 2021-05-31 LAB — PRO B NATRIURETIC PEPTIDE: NT-Pro BNP: 108 pg/mL (ref 0–738)

## 2021-06-05 ENCOUNTER — Other Ambulatory Visit (HOSPITAL_COMMUNITY): Payer: Self-pay

## 2021-06-05 ENCOUNTER — Telehealth: Payer: Self-pay

## 2021-06-05 DIAGNOSIS — J84112 Idiopathic pulmonary fibrosis: Secondary | ICD-10-CM

## 2021-06-05 NOTE — Telephone Encounter (Addendum)
Received New start paperwork for OFEV. Will update as we work through the benefits process.  Received notification from  [employer]  regarding a prior authorization for OFEV. Authorization has been APPROVED from 06/05/2021 to 06/05/2022.   Per test claim, copay for 30 days supply is $10.35  Patient can fill through  whichever specialty pharmacy she prefers, as she does not appear to be locked in.  Authorization # [id number] Key: [id number]

## 2021-06-08 ENCOUNTER — Other Ambulatory Visit: Payer: Self-pay | Admitting: Internal Medicine

## 2021-06-14 ENCOUNTER — Other Ambulatory Visit (HOSPITAL_COMMUNITY): Payer: Self-pay

## 2021-06-14 ENCOUNTER — Telehealth: Payer: Self-pay | Admitting: Pharmacist

## 2021-06-14 DIAGNOSIS — Z5181 Encounter for therapeutic drug level monitoring: Secondary | ICD-10-CM

## 2021-06-14 DIAGNOSIS — J84112 Idiopathic pulmonary fibrosis: Secondary | ICD-10-CM

## 2021-06-14 MED ORDER — OFEV 150 MG PO CAPS: 150.0000 mg | ORAL_CAPSULE | Freq: Two times a day (BID) | ORAL | 5 refills | Status: DC

## 2021-06-14 NOTE — Telephone Encounter (Signed)
? ?Subjective:  ?Patient presents today to [facility] to see pharmacy team for Ofev new start counseling.   Patient was last seen by Dr. Vaughan Browner on 05/30/21. Pertinent past medical history includes IPF, PAD, HTN, GERD, hyperlipidemia, T2DM, hypothyroidism. Patient was previously taking Esbriet/pirfenidone and could not tolerate due to weakness, fatigue, weight loss and appetite. Patient stopped taking medication in October 2022 ? ?History of CAD: No ?History of MI: No ?Current anticoagulant use: No ?History of HTN: Yes  ?History of elevated LFTs: No ?History of diarrhea, nausea, vomiting: No ? ?Objective: ?Allergies  ?Allergen Reactions  ? Codeine Nausea And Vomiting  ?  Dizzy  ? ? ?Outpatient Encounter Medications as of 06/14/2021  ?Medication Sig  ? Nintedanib (OFEV) 150 MG CAPS Take 1 capsule (150 mg total) by mouth 2 (two) times daily.  ? acetaminophen (TYLENOL) 500 MG tablet Take 500 mg by mouth every 6 (six) hours as needed.  ? amLODipine (NORVASC) 10 MG tablet Take 1 tablet by mouth once daily  ? aspirin 81 MG EC tablet Take 81 mg by mouth daily.  ? Calcium Carbonate-Vit D-Min (CALCIUM 1200 PO) Take by mouth.  ? carvedilol (COREG) 6.25 MG tablet Take 1 tablet by mouth twice daily  ? cholecalciferol (VITAMIN D3) 25 MCG (1000 UNIT) tablet Take 1,000 Units by mouth daily.  ? famotidine (PEPCID) 40 MG tablet Take 1 tablet by mouth once daily  ? levothyroxine (SYNTHROID) 50 MCG tablet Take 1 tablet (50 mcg total) by mouth daily before breakfast.  ? losartan (COZAAR) 100 MG tablet Take 1 tablet by mouth once daily  ? metFORMIN (GLUCOPHAGE XR) 500 MG 24 hr tablet Take 1 tablet (500 mg total) by mouth daily with breakfast.  ? omeprazole (PRILOSEC) 20 MG capsule Take 1 capsule (20 mg total) by mouth daily. (Patient not taking: Reported on 05/04/2021)  ? rosuvastatin (CRESTOR) 20 MG tablet Take 1 tablet (20 mg total) by mouth daily.  ? vitamin B-12 (CYANOCOBALAMIN) 1000 MCG tablet Take 1,000 mcg by mouth daily.  ?  [DISCONTINUED] Pirfenidone (ESBRIET) 267 MG TABS Take 3 tablets (801 mg total) by mouth with breakfast, with lunch, and with evening meal. (Patient not taking: Reported on 05/04/2021)  ? ?No facility-administered encounter medications on file as of 06/14/2021.  ?  ? ?Immunization History  ?Administered Date(s) Administered  ? Fluad Quad(high Dose 65+) 12/12/2018  ? Influenza Whole 01/31/2009, 02/28/2012  ? Influenza, High Dose Seasonal PF 01/06/2018, 12/12/2018, 12/03/2020  ? Influenza,inj,Quad PF,6+ Mos 01/11/2014  ? Influenza-Unspecified 01/29/2013, 12/22/2015, 12/08/2016, 01/09/2020  ? Moderna Sars-Covid-2 Vaccination 06/17/2019, 07/15/2019, 03/16/2020  ? Pneumococcal Conjugate-13 12/14/2014  ? Pneumococcal Polysaccharide-23 05/08/2012  ? Tetanus 05/08/2012  ?  ?PFT's ?TLC  ?Date Value Ref Range Status  ?01/27/2021 2.99 L Final  ?  ?CMP  ?   ?Component Value Date/Time  ? NA 137 05/30/2021 1041  ? K 4.4 05/30/2021 1041  ? CL 102 05/30/2021 1041  ? CO2 33 (H) 05/30/2021 1041  ? GLUCOSE 133 (H) 05/30/2021 1041  ? BUN 18 05/30/2021 1041  ? CREATININE 0.96 05/30/2021 1041  ? CALCIUM 10.2 05/30/2021 1041  ? PROT 8.0 05/30/2021 1041  ? PROT 8.0 05/30/2021 1041  ? ALBUMIN 4.4 05/30/2021 1041  ? ALBUMIN 4.4 05/30/2021 1041  ? AST 17 05/30/2021 1041  ? AST 17 05/30/2021 1041  ? ALT 15 05/30/2021 1041  ? ALT 15 05/30/2021 1041  ? ALKPHOS 40 05/30/2021 1041  ? ALKPHOS 40 05/30/2021 1041  ? BILITOT 0.5 05/30/2021 1041  ?  BILITOT 0.5 05/30/2021 1041  ? GFRNONAA 83 (L) 12/27/2012 0430  ? GFRAA >90 12/27/2012 0430  ?  ?CBC ?   ?Component Value Date/Time  ? WBC 7.3 03/06/2021 1447  ? RBC 4.53 03/06/2021 1447  ? HGB 13.1 03/06/2021 1447  ? HCT 39.4 03/06/2021 1447  ? PLT 290.0 03/06/2021 1447  ? MCV 87.0 03/06/2021 1447  ? MCH 28.8 12/26/2012 2236  ? MCHC 33.3 03/06/2021 1447  ? RDW 13.4 03/06/2021 1447  ? LYMPHSABS 2.1 03/06/2021 1447  ? MONOABS 0.6 03/06/2021 1447  ? EOSABS 0.1 03/06/2021 1447  ? BASOSABS 0.0 03/06/2021 1447  ?   ?LFT's ?Hepatic Function Latest Ref Rng & Units 05/30/2021 05/30/2021 04/17/2021  ?Total Protein 6.0 - 8.3 g/dL 8.0 8.0 7.8  ?Albumin 3.5 - 5.2 g/dL 4.4 4.4 4.3  ?AST 0 - 37 U/L 17 17 17   ?ALT 0 - 35 U/L 15 15 13   ?Alk Phosphatase 39 - 117 U/L 40 40 48  ?Total Bilirubin 0.2 - 1.2 mg/dL 0.5 0.5 0.4  ?Bilirubin, Direct 0.0 - 0.3 mg/dL 0.1 - 0.1  ?  ?HRCT (02/08/21) - appearance of the lungs is compatible with interstitial lung disease, considered diagnostic of usual interstitial pneumonia (UIP) per current ATS guidelines. ? ?Assessment and Plan ? ?Ofev Medication Management ?Thoroughly counseled patient on the efficacy, mechanism of action, dosing, administration, adverse effects, and monitoring parameters of Ofev. Patient verbalized understanding. Patient education handout provided.  ? ?Goals of Therapy: ?Will not stop or reverse the progression of ILD. It will slow the progression of ILD.  ?Inhibits tyrosine kinase inhibitors which slow the fibrosis/progression of ILD ?-Significant reduction in the rate of disease progression was observed after treatment ?(61.1% [before] vs 33.3% [after], P?=?0.008) over 42 weeks. ? ?Dosing: 150 mg (one capsule) by mouth twice daily (approx 12 hours apart). Discussed taking with food approximately 12 hours apart. Discussed that capsule should not be crushed or split. ? ?Adverse Effects: ?Nausea, vomiting, diarrhea (2 in 3 patients) appetite loss, weight loss - management of diarrhea with loperamide discussed including max use of 48 hours and max of 8 capsules per day. ?Abdominal pain (up to 1 in 5 patients) ?Nasopharyngitis (13%), UTI (6%) ?Risk of thrombosis (3%) and acute MI (2%) ?Hypertension (5%) ?Dizziness ?Fatigue (10%) ? ?Monitoring: ?Monitor for diarrhea, nausea and vomiting, GI perforation, hepatotoxicity  ?Monitor LFTs - baseline, monthly for first 6 months, then every 3 months routinely ?CBC w differential at baseline and every 3 months routinely ? ?Access: ?Approval of Ofev  through: insurance ?Rx sent to: [facility] (pulmonary fibrosis team). [id number] ? ?Medication Reconciliation ?A drug regimen assessment was performed, including review of allergies, interactions, disease-state management, dosing and immunization history. Medications were reviewed with the patient, including name, instructions, indication, goals of therapy, potential side effects, importance of adherence, and safe use. ? ?Anticoagulant use: No ? ?Immunizations ?Patient has received 3 COVID19 vaccines. Eligible for second booster ?She is UTD on pneumonia, influenza vaccines. ?Eligible for zoster vaccine ? ?This appointment required 60 minutes of patient care (this includes precharting, chart review, review of results, face-to-face care, etc.). ? ?Thank you for involving pharmacy to assist in providing this patient's care.  ? ?Knox Saliva, PharmD, MPH, BCPS ?Clinical Pharmacist (Rheumatology and Pulmonology) ?

## 2021-06-16 NOTE — Telephone Encounter (Signed)
Patient counseled about Ofev - documented in separate telephone encounter. ? ?Chesley Mires, PharmD, MPH, BCPS ?Clinical Pharmacist (Rheumatology and Pulmonology) ?

## 2021-06-20 DIAGNOSIS — E785 Hyperlipidemia, unspecified: Secondary | ICD-10-CM | POA: Diagnosis not present

## 2021-06-20 DIAGNOSIS — I739 Peripheral vascular disease, unspecified: Secondary | ICD-10-CM | POA: Diagnosis not present

## 2021-06-20 LAB — HEPATIC FUNCTION PANEL
ALT: 11 IU/L (ref 0–32)
AST: 15 IU/L (ref 0–40)
Albumin: 4.8 g/dL — ABNORMAL HIGH (ref 3.6–4.6)
Alkaline Phosphatase: 54 IU/L (ref 44–121)
Bilirubin Total: 0.5 mg/dL (ref 0.0–1.2)
Bilirubin, Direct: 0.14 mg/dL (ref 0.00–0.40)
Total Protein: 7.8 g/dL (ref 6.0–8.5)

## 2021-06-20 LAB — LIPID PANEL
Chol/HDL Ratio: 3.1 ratio (ref 0.0–4.4)
Cholesterol, Total: 157 mg/dL (ref 100–199)
HDL: 50 mg/dL (ref >39)
LDL Chol Calc (NIH): 69 mg/dL (ref 0–99)
Triglycerides: 233 mg/dL — ABNORMAL HIGH (ref 0–149)
VLDL Cholesterol Cal: 38 mg/dL (ref 5–40)

## 2021-06-22 ENCOUNTER — Other Ambulatory Visit: Payer: HMO | Admitting: Pharmacist

## 2021-06-22 ENCOUNTER — Encounter: Payer: Self-pay | Admitting: *Deleted

## 2021-07-03 DIAGNOSIS — E1169 Type 2 diabetes mellitus with other specified complication: Secondary | ICD-10-CM | POA: Diagnosis not present

## 2021-07-03 DIAGNOSIS — J8410 Pulmonary fibrosis, unspecified: Secondary | ICD-10-CM | POA: Diagnosis not present

## 2021-07-03 DIAGNOSIS — E1151 Type 2 diabetes mellitus with diabetic peripheral angiopathy without gangrene: Secondary | ICD-10-CM | POA: Diagnosis not present

## 2021-07-03 DIAGNOSIS — E663 Overweight: Secondary | ICD-10-CM | POA: Diagnosis not present

## 2021-07-03 DIAGNOSIS — E785 Hyperlipidemia, unspecified: Secondary | ICD-10-CM | POA: Diagnosis not present

## 2021-07-03 DIAGNOSIS — E039 Hypothyroidism, unspecified: Secondary | ICD-10-CM | POA: Diagnosis not present

## 2021-07-03 DIAGNOSIS — M1990 Unspecified osteoarthritis, unspecified site: Secondary | ICD-10-CM | POA: Diagnosis not present

## 2021-07-03 DIAGNOSIS — G8929 Other chronic pain: Secondary | ICD-10-CM | POA: Diagnosis not present

## 2021-07-03 DIAGNOSIS — E559 Vitamin D deficiency, unspecified: Secondary | ICD-10-CM | POA: Diagnosis not present

## 2021-07-03 DIAGNOSIS — I10 Essential (primary) hypertension: Secondary | ICD-10-CM | POA: Diagnosis not present

## 2021-07-03 DIAGNOSIS — K219 Gastro-esophageal reflux disease without esophagitis: Secondary | ICD-10-CM | POA: Diagnosis not present

## 2021-07-03 DIAGNOSIS — Z7982 Long term (current) use of aspirin: Secondary | ICD-10-CM | POA: Diagnosis not present

## 2021-07-28 ENCOUNTER — Ambulatory Visit: Payer: HMO | Admitting: Pulmonary Disease

## 2021-07-28 ENCOUNTER — Encounter: Payer: Self-pay | Admitting: Pulmonary Disease

## 2021-07-28 VITALS — BP 144/76 | HR 74 | Temp 98.3°F | Ht 64.0 in | Wt 153.8 lb

## 2021-07-28 DIAGNOSIS — J84112 Idiopathic pulmonary fibrosis: Secondary | ICD-10-CM

## 2021-07-28 DIAGNOSIS — Z5181 Encounter for therapeutic drug level monitoring: Secondary | ICD-10-CM

## 2021-07-28 DIAGNOSIS — I272 Pulmonary hypertension, unspecified: Secondary | ICD-10-CM

## 2021-07-28 LAB — COMPREHENSIVE METABOLIC PANEL
ALT: 131 U/L — ABNORMAL HIGH (ref 0–35)
AST: 111 U/L — ABNORMAL HIGH (ref 0–37)
Albumin: 4.2 g/dL (ref 3.5–5.2)
Alkaline Phosphatase: 136 U/L — ABNORMAL HIGH (ref 39–117)
BUN: 21 mg/dL (ref 6–23)
CO2: 25 mEq/L (ref 19–32)
Calcium: 9.3 mg/dL (ref 8.4–10.5)
Chloride: 103 mEq/L (ref 96–112)
Creatinine, Ser: 0.9 mg/dL (ref 0.40–1.20)
GFR: 59.01 mL/min — ABNORMAL LOW (ref >60.00)
Glucose, Bld: 89 mg/dL (ref 70–99)
Potassium: 4.2 mEq/L (ref 3.5–5.1)
Sodium: 137 mEq/L (ref 135–145)
Total Bilirubin: 0.5 mg/dL (ref 0.2–1.2)
Total Protein: 7.7 g/dL (ref 6.0–8.3)

## 2021-07-28 NOTE — Progress Notes (Signed)
? ?      ?Kayla Craig    [id number]    1936/02/25 ? ?Primary Care Physician:Burns, Bobette Mo, MD ? ?Referring Physician: Pincus Sanes, MD ?[address] ?[place],  [place] [address] ? ?Chief complaint: Follow-up for interstitial lung disease ? ?HPI: ?86 year old with history of hypertension, diabetes, hyperlipidemia, allergies, GERD on PPI, peripheral artery disease ?She has a dry nonproductive cough since March 2022.  No associated dyspnea fevers or chills.  Given Z-Pak by primary care in July 2022 ?Chest x-ray and follow-up CT showed interstitial lung disease and she has been referred to pulmonary for further evaluation ? ?Has occasional joint pain.  No morning stiffness, difficulty swallowing, denies dry mouth, dry eyes ? ?She has been evaluated by Dr. Dimple Casey, rheumatology in 01/30/2021 for elevated ANA.  He did not feel that she had any connective tissue disease ? ?Pets: No pets ?Occupation: Used to work in a [employer] ?Exposures: Was exposed to acetone while working.  No ongoing exposures.  No mold, hot tub, Jacuzzi.  No feather pillows or comforters ?Smoking history: Never smoker ?Travel history: No significant travel history ?Relevant family history: No family history of lung disease ? ?Interim history: ?Diagnosis of IPF given after MDC in sept 2022. Started on Esbriet in October 2022.  She took it for about a month and stopped due to weakness, fatigue, loss of weight and appetite.  She reports a couple of falls at home with syncope.   ? ?Now on Ofev since March 2023. She is tolerating this med better. Dyspnea on exertion is stable.  ? ? ?Outpatient Encounter Medications as of 07/28/2021  ?Medication Sig  ? acetaminophen (TYLENOL) 500 MG tablet Take 500 mg by mouth every 6 (six) hours as needed.  ? amLODipine (NORVASC) 10 MG tablet Take 1 tablet by mouth once daily  ? aspirin 81 MG EC tablet Take 81 mg by mouth daily.  ? Calcium Carbonate-Vit D-Min (CALCIUM 1200 PO) Take by mouth.  ? carvedilol (COREG)  6.25 MG tablet Take 1 tablet by mouth twice daily  ? cholecalciferol (VITAMIN D3) 25 MCG (1000 UNIT) tablet Take 1,000 Units by mouth daily.  ? famotidine (PEPCID) 40 MG tablet Take 1 tablet by mouth once daily  ? levothyroxine (SYNTHROID) 50 MCG tablet Take 1 tablet (50 mcg total) by mouth daily before breakfast.  ? losartan (COZAAR) 100 MG tablet Take 1 tablet by mouth once daily  ? metFORMIN (GLUCOPHAGE XR) 500 MG 24 hr tablet Take 1 tablet (500 mg total) by mouth daily with breakfast.  ? Nintedanib (OFEV) 150 MG CAPS Take 1 capsule (150 mg total) by mouth 2 (two) times daily.  ? vitamin B-12 (CYANOCOBALAMIN) 1000 MCG tablet Take 1,000 mcg by mouth daily.  ? rosuvastatin (CRESTOR) 20 MG tablet Take 1 tablet (20 mg total) by mouth daily.  ? [DISCONTINUED] omeprazole (PRILOSEC) 20 MG capsule Take 1 capsule (20 mg total) by mouth daily. (Patient not taking: Reported on 05/04/2021)  ? ?No facility-administered encounter medications on file as of 07/28/2021.  ? ? ?Physical Exam: ?Blood pressure (!) 144/76, pulse 74, temperature 98.3 ?F (36.8 ?C), temperature source Oral, height 5\' 4"  (1.626 m), weight 153 lb 12.8 oz (69.8 kg), SpO2 96 %. ?Gen:      No acute distress ?HEENT:  EOMI, sclera anicteric ?Neck:     No masses; no thyromegaly ?Lungs:    Bibasal crackles ?CV:         Regular rate and rhythm; no murmurs ?Abd:      +  bowel sounds; soft, non-tender; no palpable masses, no distension ?Ext:    No edema; adequate peripheral perfusion ?Skin:      Warm and dry; no rash ?Neuro: alert and oriented x 3 ?Psych: normal mood and affect  ? ?Data Reviewed: ?Imaging: ?CT chest 11/10/2020-peripheral reticulation, traction bronchiectasis with minimal honeycombing.  UIP pattern pulmonary fibrosis ? ?High-res CT lung 02/07/21-stable pattern of UIP pattern pulmonary fibrosis.  Nodular opacity in the left lung has remained stable ?I have reviewed the images personally ? ?PFTs: ?01/27/2021 ?FVC 1.55 (62%], FEV1 1.38 [95%], F/F 89, TLC 2.99  [59%], DLCO 10.13 [54%] ?Severe restriction, diffusion defect ? ?Labs: ?CTD serologies-ANA 12/06/2020- 1:1280, nuclear speckled ?Myositis panel positive for PL 7 ? ?Cardiac: ?Echocardiogram 04/17/2021 ?LVEF 55-60%, grade 1 diastolic dysfunction, mild reduction in RV systolic function, mild elevation in PA systolic pressure, estimated RVSP 36.4 ? ?Assessment:  ?IPF ?Has CT shows pattern of pulmonary fibrosis in UIP.  She does not have any exposures, signs and symptoms of connective tissue disease though ANA noted to be very high.  No evidence of connective tissue disease per rheumatology ? ?She has not tolerated Esbriet and since now on Ofev which is better ?Check comprehensive metabolic panel today and monthly ? ?Mild pulmonary hypertension ?Echo reviewed with mild elevation in PA systolic pressure.  We discussed right heart catheterization but she does not want any invasive procedures at present.  Will reassess at return visit ? ?Lung nodule ?There appears to be an area of nodular fibrosis at the left lung base which may be part of the fibrotic process.  This has remained stable on 24-month follow-up ?Order 26-month follow-up scan which is due for May 2023 ? ?Plan/Recommendations: ?Continue Ofev ?Check comprehensive metabolic panel ?Follow-up high-res CT in May 2023 ? ?Chilton Greathouse MD ?[facility] ?07/28/2021, 1:40 PM ? ?CC: Pincus Sanes, MD ? ?  ?

## 2021-07-28 NOTE — Patient Instructions (Addendum)
Glad you are doing well with your breathing and tolerating the new medication better ?We will check comprehensive metabolic panel today and monthly ?Schedule high-res CT in 1 month ?Follow-up in 3 months. ?

## 2021-08-01 DIAGNOSIS — L720 Epidermal cyst: Secondary | ICD-10-CM | POA: Diagnosis not present

## 2021-08-10 ENCOUNTER — Ambulatory Visit
Admission: RE | Admit: 2021-08-10 | Discharge: 2021-08-10 | Disposition: A | Discharge status: Home / Self Care | Payer: HMO | Source: Ambulatory Visit | Attending: Internal Medicine | Admitting: Internal Medicine

## 2021-08-10 DIAGNOSIS — Z780 Asymptomatic menopausal state: Secondary | ICD-10-CM | POA: Diagnosis not present

## 2021-08-10 DIAGNOSIS — M8589 Other specified disorders of bone density and structure, multiple sites: Secondary | ICD-10-CM

## 2021-08-10 DIAGNOSIS — M85852 Other specified disorders of bone density and structure, left thigh: Secondary | ICD-10-CM | POA: Diagnosis not present

## 2021-08-14 ENCOUNTER — Other Ambulatory Visit: Payer: Self-pay | Admitting: *Deleted

## 2021-08-14 DIAGNOSIS — Z5181 Encounter for therapeutic drug level monitoring: Secondary | ICD-10-CM

## 2021-08-23 ENCOUNTER — Ambulatory Visit
Admission: RE | Admit: 2021-08-23 | Discharge: 2021-08-23 | Disposition: A | Discharge status: Home / Self Care | Payer: HMO | Source: Ambulatory Visit | Attending: Pulmonary Disease | Admitting: Pulmonary Disease

## 2021-08-23 DIAGNOSIS — J479 Bronchiectasis, uncomplicated: Secondary | ICD-10-CM | POA: Diagnosis not present

## 2021-08-23 DIAGNOSIS — J84112 Idiopathic pulmonary fibrosis: Secondary | ICD-10-CM | POA: Diagnosis not present

## 2021-08-23 DIAGNOSIS — I2510 Atherosclerotic heart disease of native coronary artery without angina pectoris: Secondary | ICD-10-CM | POA: Diagnosis not present

## 2021-08-23 DIAGNOSIS — R634 Abnormal weight loss: Secondary | ICD-10-CM | POA: Diagnosis not present

## 2021-08-28 ENCOUNTER — Telehealth: Payer: Self-pay | Admitting: Pulmonary Disease

## 2021-08-28 ENCOUNTER — Telehealth: Payer: Self-pay | Admitting: Cardiovascular Disease

## 2021-08-28 ENCOUNTER — Other Ambulatory Visit (INDEPENDENT_AMBULATORY_CARE_PROVIDER_SITE_OTHER): Payer: HMO

## 2021-08-28 DIAGNOSIS — Z5181 Encounter for therapeutic drug level monitoring: Secondary | ICD-10-CM | POA: Diagnosis not present

## 2021-08-28 LAB — COMPREHENSIVE METABOLIC PANEL
ALT: 12 U/L (ref 0–35)
AST: 15 U/L (ref 0–37)
Albumin: 4.1 g/dL (ref 3.5–5.2)
Alkaline Phosphatase: 69 U/L (ref 39–117)
BUN: 23 mg/dL (ref 6–23)
CO2: 27 mEq/L (ref 19–32)
Calcium: 9.5 mg/dL (ref 8.4–10.5)
Chloride: 104 mEq/L (ref 96–112)
Creatinine, Ser: 1.03 mg/dL (ref 0.40–1.20)
GFR: 50.16 mL/min — ABNORMAL LOW (ref >60.00)
Glucose, Bld: 166 mg/dL — ABNORMAL HIGH (ref 70–99)
Potassium: 4 mEq/L (ref 3.5–5.1)
Sodium: 137 mEq/L (ref 135–145)
Total Bilirubin: 0.5 mg/dL (ref 0.2–1.2)
Total Protein: 7.4 g/dL (ref 6.0–8.3)

## 2021-08-28 NOTE — Telephone Encounter (Signed)
Pt notified that DERM office needs fax over written request for cardiac clearance.

## 2021-08-28 NOTE — Telephone Encounter (Signed)
Pt states she had a place come up on her face that needs to be removed on the 24th at 9 AM and needs a call back to make sure this will be okay in regards to her heart care. Please advise.

## 2021-08-28 NOTE — Telephone Encounter (Signed)
Patient states that she would like a one time visit with Dr Sherene Sires. She is not wanting to change providers but she is wanting a one time office visit with Dr Sherene Sires  Advised patient that we still had to get permission from both doctors.  Dr Sherene Sires are you ok with this  Dr Isaiah Serge are you okay with this

## 2021-08-28 NOTE — Telephone Encounter (Signed)
pt came into office today requesting to schedule an O/V with md wert for just this one time, but doesnt technically want to switch providers. I instructed the pt both both mds would have to agree. please f/u. Also pt didn't state what she wanted to discuss w/md wert specifically.

## 2021-08-28 NOTE — Telephone Encounter (Signed)
Fine with me

## 2021-08-29 NOTE — Telephone Encounter (Signed)
Fine with me  She has a diagnosis of IPF and is finding it difficult to tolerate medication. We stopped Esbriet due to side effects and recently had to stop Ofev as her LFTs were elevated. LFTs have normalized after stopping Ofev  Please make follow-up visit with me in 2 to 3 months to reevaluate and plan for next steps for treatment of pulmonary fibrosis

## 2021-08-30 ENCOUNTER — Other Ambulatory Visit: Payer: Self-pay | Admitting: Cardiovascular Disease

## 2021-08-30 DIAGNOSIS — L720 Epidermal cyst: Secondary | ICD-10-CM | POA: Diagnosis not present

## 2021-08-30 NOTE — Telephone Encounter (Signed)
Refill request

## 2021-08-30 NOTE — Telephone Encounter (Signed)
Attempted to call pt to get her scheduled for a visit with Dr. Sherene Sires (new pt appt) but unable to reach. Left message for her to return call.

## 2021-09-03 ENCOUNTER — Encounter: Payer: Self-pay | Admitting: Internal Medicine

## 2021-09-03 NOTE — Patient Instructions (Addendum)
     Blood work was ordered.     Medications changes include :      Your prescription(s) have been sent to your pharmacy.    A referral was ordered for XX.     Someone from that office will call you to schedule an appointment.    Return in about 6 months (around 03/08/2022).

## 2021-09-03 NOTE — Progress Notes (Unsigned)
      Subjective:    Patient ID: Kayla Craig, female    DOB: 1935-12-27, 86 y.o.   MRN: [id number]     HPI Kayla Craig is here for follow up of her chronic medical problems, including chronic cough from pulmonary fibrosis, htn, hypothyroid, DM, hld  Review dexa - LFN -1.5 with high frax  BP needs to be better controlled  Medications and allergies reviewed with patient and updated if appropriate.  Current Outpatient Medications on File Prior to Visit  Medication Sig Dispense Refill   acetaminophen (TYLENOL) 500 MG tablet Take 500 mg by mouth every 6 (six) hours as needed.     amLODipine (NORVASC) 10 MG tablet Take 1 tablet by mouth once daily 90 tablet 1   aspirin 81 MG EC tablet Take 81 mg by mouth daily.     Calcium Carbonate-Vit D-Min (CALCIUM 1200 PO) Take by mouth.     carvedilol (COREG) 6.25 MG tablet Take 1 tablet by mouth twice daily 180 tablet 3   cholecalciferol (VITAMIN D3) 25 MCG (1000 UNIT) tablet Take 1,000 Units by mouth daily.     famotidine (PEPCID) 40 MG tablet Take 1 tablet by mouth once daily 90 tablet 0   levothyroxine (SYNTHROID) 50 MCG tablet Take 1 tablet (50 mcg total) by mouth daily before breakfast. 90 tablet 1   losartan (COZAAR) 100 MG tablet Take 1 tablet by mouth once daily 90 tablet 0   metFORMIN (GLUCOPHAGE XR) 500 MG 24 hr tablet Take 1 tablet (500 mg total) by mouth daily with breakfast. 90 tablet 3   Nintedanib (OFEV) 150 MG CAPS Take 1 capsule (150 mg total) by mouth 2 (two) times daily. 60 capsule 5   rosuvastatin (CRESTOR) 20 MG tablet Take 1 tablet (20 mg total) by mouth daily. 90 tablet 3   vitamin B-12 (CYANOCOBALAMIN) 1000 MCG tablet Take 1,000 mcg by mouth daily.     No current facility-administered medications on file prior to visit.     Review of Systems     Objective:  There were no vitals filed for this visit. BP Readings from Last 3 Encounters:  07/28/21 (!) 144/76  05/30/21 138/68  04/18/21 (!) 142/80   Wt Readings from  Last 3 Encounters:  07/28/21 153 lb 12.8 oz (69.8 kg)  05/30/21 153 lb 12.8 oz (69.8 kg)  04/18/21 155 lb (70.3 kg)   There is no height or weight on file to calculate BMI.    Physical Exam     Lab Results  Component Value Date   WBC 7.3 03/06/2021   HGB 13.1 03/06/2021   HCT 39.4 03/06/2021   PLT 290.0 03/06/2021   GLUCOSE 166 (H) 08/28/2021   CHOL 157 06/20/2021   TRIG 233 (H) 06/20/2021   HDL 50 06/20/2021   LDLDIRECT 122.0 04/17/2021   LDLCALC 69 06/20/2021   ALT 12 08/28/2021   AST 15 08/28/2021   NA 137 08/28/2021   K 4.0 08/28/2021   CL 104 08/28/2021   CREATININE 1.03 08/28/2021   BUN 23 08/28/2021   CO2 27 08/28/2021   TSH 2.06 03/06/2021   HGBA1C 5.9 03/06/2021   MICROALBUR 3.4 (H) 02/26/2020     Assessment & Plan:    See Problem List for Assessment and Plan of chronic medical problems.

## 2021-09-05 ENCOUNTER — Ambulatory Visit (INDEPENDENT_AMBULATORY_CARE_PROVIDER_SITE_OTHER): Payer: HMO | Admitting: Internal Medicine

## 2021-09-05 VITALS — BP 136/80 | HR 78 | Temp 98.6°F | Ht 64.0 in | Wt 150.0 lb

## 2021-09-05 DIAGNOSIS — K219 Gastro-esophageal reflux disease without esophagitis: Secondary | ICD-10-CM

## 2021-09-05 DIAGNOSIS — E785 Hyperlipidemia, unspecified: Secondary | ICD-10-CM

## 2021-09-05 DIAGNOSIS — M810 Age-related osteoporosis without current pathological fracture: Secondary | ICD-10-CM | POA: Diagnosis not present

## 2021-09-05 DIAGNOSIS — E1169 Type 2 diabetes mellitus with other specified complication: Secondary | ICD-10-CM

## 2021-09-05 DIAGNOSIS — E118 Type 2 diabetes mellitus with unspecified complications: Secondary | ICD-10-CM

## 2021-09-05 DIAGNOSIS — I739 Peripheral vascular disease, unspecified: Secondary | ICD-10-CM

## 2021-09-05 DIAGNOSIS — E039 Hypothyroidism, unspecified: Secondary | ICD-10-CM | POA: Diagnosis not present

## 2021-09-05 DIAGNOSIS — I10 Essential (primary) hypertension: Secondary | ICD-10-CM | POA: Diagnosis not present

## 2021-09-05 DIAGNOSIS — E538 Deficiency of other specified B group vitamins: Secondary | ICD-10-CM

## 2021-09-05 DIAGNOSIS — J84112 Idiopathic pulmonary fibrosis: Secondary | ICD-10-CM

## 2021-09-05 DIAGNOSIS — R5383 Other fatigue: Secondary | ICD-10-CM | POA: Diagnosis not present

## 2021-09-05 NOTE — Assessment & Plan Note (Signed)
Chronic  Clinically euthyroid Currently taking levothyroxine 50 mcg before breakfast Check tsh  Titrate med dose if needed

## 2021-09-05 NOTE — Assessment & Plan Note (Signed)
Chronic Check lipid panel  Continue crestor 20 mg daily Regular exercise and healthy diet encouraged  

## 2021-09-05 NOTE — Assessment & Plan Note (Signed)
Chronic BP well controlled Continue amlodipine 10 mg daily, coreg 6.25 mg bid, losartan 100 mg daily cmp

## 2021-09-05 NOTE — Assessment & Plan Note (Addendum)
New Last bone density showed osteopenia with high FRAX She has had many fractures-mostly feet/toes related to falls Reviewed bone density She deferred any treatment at this time Continue calcium and vitamin D daily Continue to be active-no formal regular exercise Repeat DEXA in 2 years

## 2021-09-05 NOTE — Assessment & Plan Note (Signed)
Chronic GERD controlled Continue famotidine 40 mg daily 

## 2021-09-05 NOTE — Assessment & Plan Note (Signed)
Chronic Taking B12 daily 

## 2021-09-05 NOTE — Assessment & Plan Note (Signed)
Chronic  Lab Results  Component Value Date   HGBA1C 5.9 03/06/2021   Sugars well controlled Testing sugars 0 times a day Check A1c, urine microalbumin today Continue metformin 500 mg daily with breakfast Encouraged regular exercise, diabetic diet

## 2021-09-05 NOTE — Assessment & Plan Note (Signed)
Chronic Monitored by cardiology Last saw cardiology January 2023 and at that time did not have any symptoms She does state some leg pain with activity-advised to monitor closely and if if this worsens she needs to contact cardiology

## 2021-09-05 NOTE — Assessment & Plan Note (Signed)
Complains of fatigue Sleeping well and feels refreshed after waking up We will check basic blood work including TSH, CBC, CMP Discussed that some of her fatigue is likely related to her pulmonary fibrosis, but blood work will help rule out other causes

## 2021-09-05 NOTE — Assessment & Plan Note (Addendum)
Chronic Following with Dr Isaiah Serge Did not tolerate Esbriet or OFEV Not understanding her diagnosis of pulmonary fibrosis well-has an appointment to see Dr. Sherene Sires from the second opinion.  Sounds like she is under the impression that she has a spot on her lung

## 2021-09-13 ENCOUNTER — Ambulatory Visit: Payer: HMO | Admitting: Internal Medicine

## 2021-09-13 ENCOUNTER — Encounter: Payer: Self-pay | Admitting: Internal Medicine

## 2021-09-13 DIAGNOSIS — J84112 Idiopathic pulmonary fibrosis: Secondary | ICD-10-CM

## 2021-09-13 MED ORDER — PANTOPRAZOLE SODIUM 40 MG PO TBEC: 40.0000 mg | DELAYED_RELEASE_TABLET | Freq: Every day | ORAL | 2 refills | Status: DC

## 2021-09-13 NOTE — Assessment & Plan Note (Addendum)
Presented with "cough x one year" with cxr c/w new PF p covid vaccination but never infected - failed to tolerate  antifibrotics spring 2023 and declined to reconsider rx at of 09/13/2021  - 09/13/2021   Walked on RA  x  3  lap(s) =  approx 750  ft  @ avg pace, stopped due to end of study s sob  with lowest 02 sats 91%   Refuses at this point to  Start back  on any form of antifibrotic and only symptom is mostly dry cough which was her original presenting and has spont improved.  Best we can hope for in short run is control cough ? Partly upper airway in nature   Use of PPI is associated with improved survival time and with decreased radiologic fibrosis per King's study published in AJRCCM vol 184 p1390.  Dec 2011 and also may have other beneficial effects as per the latest review in [place] vol 193 [id number] Jun 20016.  This may not always be cause and effect, but given limited options in this case rec start  rx ppi / diet/ lifestyle modification and f/u with serial walking sats and lung volumes for now to put more points on the curve / establish firm baseline before considering additional options   >>> rec ppi q am / h2 in pm and gerd diet/bed blocks   F/u in 6 weeks with repeat walk, call in meantime if losing ground off antifibrotics    Each maintenance medication was reviewed in detail including emphasizing most importantly the difference between maintenance and prns and under what circumstances the prns are to be triggered using an action plan format where appropriate.  Total time for H and P, chart review, counseling,  directly observing portions of ambulatory 02 saturation study/ and generating customized AVS unique to this office visit / same day charting > 45 min with  pt new to me

## 2021-09-13 NOTE — Patient Instructions (Addendum)
Pantoprazole (protonix) 40 mg   Take  30-60 min before first meal of the day and Pepcid (famotidine)  40 mg after supper until return to office - this is the best way to tell whether stomach acid is contributing to your problem.     GERD (REFLUX)  is an extremely common cause of respiratory symptoms just like yours , many times with no obvious heartburn at all.    It can be treated with medication, but also with lifestyle changes including elevation of the head of your bed (ideally with 6 -8inch blocks under the headboard of your bed),  Smoking cessation, avoidance of late meals, excessive alcohol, and avoid fatty foods, chocolate, peppermint, colas, red wine, and acidic juices such as orange juice.  NO MINT OR MENTHOL PRODUCTS SO NO COUGH DROPS  USE SUGARLESS CANDY INSTEAD (Jolley ranchers or Stover's or Life Savers) or even ice chips will also do - the key is to swallow to prevent all throat clearing. NO OIL BASED VITAMINS - use powdered substitutes.  Avoid fish oil when coughing.    Please schedule a follow up office visit in 6 weeks, call sooner if needed

## 2021-09-13 NOTE — Progress Notes (Signed)
Kayla Craig, female    DOB: 06-01-1935   MRN: [id number]   Brief patient profile:  7   yowf exposed to chemicals making eyeglasses until 2003  never smoker self- referred to pulmonary clinic 09/13/2021 for PF having failed to tol  antifibrotic rx presented with a cough p 1st covid injection ?march 2021  and coughed "for a year" before easing up sprng 2023  but still present esp p supper   S/p goiter surgery  2009   History of Present Illness  09/13/2021  Pulmonary/ 1st office eval/Roneka Gilpin OFEV x sev weeks but stopped Aug 14 2021 p  Chief Complaint  Patient presents with   Follow-up    Pt states she has been doing okay since last visit. States after being placed on OFEV by Dr. Isaiah Serge, she felt like it took a toll on her body.  Dyspnea:  no regular walking/ holds on to buggy since covid shots  Cough: better now present p supper on famotidine 40 mg one with breakfast   Sleep: one pillow  SABA use: none   No obvious day to day or daytime pattern/variability or assoc excess/ purulent sputum or mucus plugs or hemoptysis or cp or chest tightness, subjective wheeze or overt sinus or hb symptoms.   Sleeping  without nocturnal  or early am exacerbation  of respiratory  c/o's or need for noct saba. Also denies any obvious fluctuation of symptoms with weather or environmental changes or other aggravating or alleviating factors except as outlined above   No unusual exposure hx or h/o childhood pna/ asthma or knowledge of premature birth.  Current Allergies, Complete Past Medical History, Past Surgical History, Family History, and Social History were reviewed in [employer] record.  ROS  The following are not active complaints unless bolded Hoarseness, sore throat, dysphagia, dental problems, itching, sneezing,  nasal congestion or discharge of excess mucus or purulent secretions, ear ache,   fever, chills, sweats, unintended wt loss or wt gain, classically pleuritic or exertional  cp,  orthopnea pnd or arm/hand swelling  or leg swelling, presyncope, palpitations, abdominal pain, anorexia, nausea, vomiting, diarrhea  or change in bowel habits or change in bladder habits, change in stools or change in urine, dysuria, hematuria,  rash, arthralgias, visual complaints, headache, numbness, weakness or ataxia or problems with walking or coordination,  change in mood or  memory.           Past Medical History:  Diagnosis Date   B12 deficiency    Back pain    Chronic   Goiter    Grief    Grief and loss related depression '08   Hypertension    TMJ (dislocation of temporomandibular joint)    Right Side   Vitamin D deficiency     Outpatient Medications Prior to Visit  Medication Sig Dispense Refill   acetaminophen (TYLENOL) 500 MG tablet Take 500 mg by mouth every 6 (six) hours as needed.     amLODipine (NORVASC) 10 MG tablet Take 1 tablet by mouth once daily 90 tablet 1   aspirin 81 MG EC tablet Take 81 mg by mouth daily.     Calcium Carbonate-Vit D-Min (CALCIUM 1200 PO) Take by mouth.     carvedilol (COREG) 6.25 MG tablet Take 1 tablet by mouth twice daily 180 tablet 3   cholecalciferol (VITAMIN D3) 25 MCG (1000 UNIT) tablet Take 1,000 Units by mouth daily.     famotidine (PEPCID) 40 MG tablet Take 1 tablet  by mouth once daily 90 tablet 0   levothyroxine (SYNTHROID) 50 MCG tablet Take 1 tablet (50 mcg total) by mouth daily before breakfast. 90 tablet 1   losartan (COZAAR) 100 MG tablet Take 1 tablet by mouth once daily 90 tablet 0   metFORMIN (GLUCOPHAGE XR) 500 MG 24 hr tablet Take 1 tablet (500 mg total) by mouth daily with breakfast. 90 tablet 3   vitamin B-12 (CYANOCOBALAMIN) 1000 MCG tablet Take 1,000 mcg by mouth daily.     rosuvastatin (CRESTOR) 20 MG tablet Take 1 tablet (20 mg total) by mouth daily. 90 tablet 3   No facility-administered medications prior to visit.     Objective:     BP 140/86 (BP Location: Left Arm, Patient Position: Sitting, Cuff Size:  Normal)   Pulse 69   Temp 98 F (36.7 C) (Oral)   Ht 5\' 4"  (1.626 m)   Wt 152 lb (68.9 kg)   SpO2 94% Comment: RA  BMI 26.09 kg/m   SpO2: 94 % (RA)  Pleasant amb wf nad/ freq TC   HEENT : Oropharynx  clear      Nasal turbinates nl    NECK :  without  appent JVD/ palpable Nodes/TM    LUNGS: no acc muscle use,  Nl contour chest with trace insp crackles bases   bilaterally without cough on insp or exp maneuvers   CV:  RRR  no s3 or murmur or increase in P2, and no edema   ABD:  soft and nontender with nl inspiratory excursion in the supine position. No bruits or organomegaly appreciated   MS:  Nl gait/ ext warm without deformities Or obvious joint restrictions  calf tenderness, cyanosis or clubbing    SKIN: warm and dry without lesions    NEURO:  alert, approp, nl sensorium with  no motor or cerebellar deficits apparent.     I personally reviewed images and agree with radiology impression as follows:   Chest HRCT  08/22/21  Unchanged, moderate pulmonary fibrosis in a pattern with apical to basal gradient, featuring irregular peripheral interstitial opacity, septal thickening, subpleural bronchiolectasis, and evidence of honeycombing at the lung bases. Findings are in keeping with diagnosis of IPF and consistent with UIP     Assessment   No problem-specific Assessment & Plan notes found for this encounter.     08/24/21, MD 09/13/2021

## 2021-09-15 ENCOUNTER — Telehealth: Payer: Self-pay

## 2021-09-15 NOTE — Telephone Encounter (Signed)
Spoke with patient,   Was updating office as she was restarted back on pantoprazole 40mg  daily with most recent pulmonology visit. No previous intolerance or AE noted when she had been on previously, will reach out with any issues or concerns   , PharmD Clinical Pharmacist, [facility]

## 2021-09-29 ENCOUNTER — Other Ambulatory Visit: Payer: Self-pay | Admitting: Internal Medicine

## 2021-10-11 ENCOUNTER — Other Ambulatory Visit: Payer: Self-pay | Admitting: Internal Medicine

## 2021-10-23 DIAGNOSIS — Z961 Presence of intraocular lens: Secondary | ICD-10-CM | POA: Diagnosis not present

## 2021-10-23 DIAGNOSIS — H35373 Puckering of macula, bilateral: Secondary | ICD-10-CM | POA: Diagnosis not present

## 2021-10-23 DIAGNOSIS — H35033 Hypertensive retinopathy, bilateral: Secondary | ICD-10-CM | POA: Diagnosis not present

## 2021-10-23 DIAGNOSIS — H18513 Endothelial corneal dystrophy, bilateral: Secondary | ICD-10-CM | POA: Diagnosis not present

## 2021-10-23 DIAGNOSIS — E119 Type 2 diabetes mellitus without complications: Secondary | ICD-10-CM | POA: Diagnosis not present

## 2021-10-30 ENCOUNTER — Ambulatory Visit (INDEPENDENT_AMBULATORY_CARE_PROVIDER_SITE_OTHER): Payer: HMO | Admitting: Internal Medicine

## 2021-10-30 ENCOUNTER — Encounter: Payer: Self-pay | Admitting: Internal Medicine

## 2021-10-30 DIAGNOSIS — J84112 Idiopathic pulmonary fibrosis: Secondary | ICD-10-CM

## 2021-10-30 NOTE — Patient Instructions (Addendum)
Try stop  pantoprazole  and change pepcid 20 mg after bfast and after supper to see if dizziness better or breathing or coughing worse and call me when you can tell any difference to decide what next to offer.   Please schedule a follow up visit in 3 months but call sooner if needed

## 2021-10-30 NOTE — Progress Notes (Unsigned)
Kayla Craig, female    DOB: 1935-11-03   MRN: [id number]   Brief patient profile:  48  yowf exposed to chemicals making eyeglasses until 2003  never smoker self- referred to pulmonary clinic 09/13/2021 for PF having failed to tol  antifibrotic rx presented with a cough p 1st covid injection ?march 2021  and coughed "for a year" before easing up sprng 2023  but still present esp p supper   S/p goiter surgery  2009   History of Present Illness  09/13/2021  Pulmonary/ 1st office eval/Kayla Craig OFEV x sev weeks but stopped Aug 14 2021 UGI intol   Chief Complaint  Patient presents with   Follow-up    Pt states she has been doing okay since last visit. States after being placed on OFEV by Dr. Isaiah Serge, she felt like it took a toll on her body.  Dyspnea:  no regular walking/ holds on to buggy since covid shots  Cough: better now present p supper on famotidine 40 mg one with breakfast   Sleep: one pillow  SABA use: none  Rec Pantoprazole (protonix) 40 mg  Take  30-60 min before first meal of the day and Pepcid (famotidine)  40 mg after supper until return to office  GERD diet  Please schedule a follow up office visit in 6 weeks, call sooner if needed   10/30/2021  f/u ov/Kayla Craig re: UIP declines antifibrotic maint on GERD   Chief Complaint  Patient presents with   Follow-up    Pt states that the medicine ( pantoprazole )  she was prescribed made her feel dizzy.   Dyspnea:  limited by legs > doe  Cough: a little in am  Sleeping: bed is flat/ 2 pillows  SABA use: none  02: none  Covid status:   vax x all but last booster    No obvious day to day or daytime variability or assoc excess/ purulent sputum or mucus plugs or hemoptysis or cp or chest tightness, subjective wheeze or overt sinus or hb symptoms.   Sleeping  without nocturnal  or early am exacerbation  of respiratory  c/o's or need for noct saba. Also denies any obvious fluctuation of symptoms with weather or environmental changes or other  aggravating or alleviating factors except as outlined above   No unusual exposure hx or h/o childhood pna/ asthma or knowledge of premature birth.  Current Allergies, Complete Past Medical History, Past Surgical History, Family History, and Social History were reviewed in [employer] record.  ROS  The following are not active complaints unless bolded Hoarseness, sore throat, dysphagia, dental problems, itching, sneezing,  nasal congestion or discharge of excess mucus or purulent secretions, ear ache,   fever, chills, sweats, unintended wt loss or wt gain, classically pleuritic or exertional cp,  orthopnea pnd or arm/hand swelling  or leg swelling, presyncope, palpitations, abdominal pain, anorexia, nausea, vomiting, diarrhea  or change in bowel habits or change in bladder habits, change in stools or change in urine, dysuria, hematuria,  rash, arthralgias, visual complaints, headache, numbness, weakness or ataxia or problems with walking or coordination,  change in mood or  memory.        Current Meds  Medication Sig   acetaminophen (TYLENOL) 500 MG tablet Take 500 mg by mouth every 6 (six) hours as needed.   amLODipine (NORVASC) 10 MG tablet Take 1 tablet by mouth once daily   aspirin 81 MG EC tablet Take 81 mg by mouth daily.  Calcium Carbonate-Vit D-Min (CALCIUM 1200 PO) Take by mouth.   carvedilol (COREG) 6.25 MG tablet Take 1 tablet by mouth twice daily   cholecalciferol (VITAMIN D3) 25 MCG (1000 UNIT) tablet Take 1,000 Units by mouth daily.   famotidine (PEPCID) 40 MG tablet Take 1 tablet by mouth once daily   levothyroxine (SYNTHROID) 50 MCG tablet TAKE 1 TABLET BY MOUTH ONCE DAILY BEFORE BREAKFAST   losartan (COZAAR) 100 MG tablet Take 1 tablet by mouth once daily   metFORMIN (GLUCOPHAGE-XR) 500 MG 24 hr tablet Take 1 tablet by mouth once daily with breakfast   pantoprazole (PROTONIX) 40 MG tablet Take 1 tablet (40 mg total) by mouth daily. Take 30-60 min before  first meal of the day   vitamin B-12 (CYANOCOBALAMIN) 1000 MCG tablet Take 1,000 mcg by mouth daily.                   Past Medical History:  Diagnosis Date   B12 deficiency    Back pain    Chronic   Goiter    Grief    Grief and loss related depression '08   Hypertension    TMJ (dislocation of temporomandibular joint)    Right Side   Vitamin D deficiency         Objective:     Wt Readings from Last 3 Encounters:  10/30/21 149 lb 3.2 oz (67.7 kg)  09/13/21 152 lb (68.9 kg)  09/05/21 150 lb (68 kg)      Vital signs reviewed  10/30/2021  - Note at rest 02 sats  ***% on ***   General appearance:    ***        I personally reviewed images and agree with radiology impression as follows:   Chest HRCT  08/22/21  Unchanged, moderate pulmonary fibrosis in a pattern with apical to basal gradient, featuring irregular peripheral interstitial opacity, septal thickening, subpleural bronchiolectasis, and evidence of honeycombing at the lung bases. Findings are in keeping with diagnosis of IPF and consistent with UIP     Assessment

## 2021-10-31 ENCOUNTER — Encounter: Payer: Self-pay | Admitting: Internal Medicine

## 2021-10-31 NOTE — Assessment & Plan Note (Signed)
Onset ? March 2021 p covid vax  Presented with "cough x one year" with cxr c/w new PF p covid vaccination but never infected to her knowledge - failed to tolerate  antifibrotics spring 2023 and declined to reconsider rx at of 09/13/2021  - 09/13/2021   Walked on RA  x  3  lap(s) =  approx 750  ft  @ avg pace, stopped due to end of study s sob  with lowest 02 sats 91%  - 10/30/2021   Walked on RA  x  3  lap(s) =  approx 450  ft  @ fast pace, stopped due to end of study s sob  with lowest 02 sats 88%  She is walking faster but desaturating more since last ov but declines to reconsider returning to PF clinic and not clear she's taking effective GERD rx either but reasonable to try max H2 / diet and f/u with serial sats/ consider 02 with exertion if she desires and f/u a 3 m   Each maintenance medication was reviewed in detail including emphasizing most importantly the difference between maintenance and prns and under what circumstances the prns are to be triggered using an action plan format where appropriate.  Total time for H and P, chart review, counseling,   , directly observing portions of ambulatory 02 saturation study/ and generating customized AVS unique to this office visit / same day charting = 36 min

## 2021-11-01 ENCOUNTER — Telehealth: Payer: HMO

## 2021-11-03 ENCOUNTER — Ambulatory Visit: Payer: HMO | Admitting: Pulmonary Disease

## 2021-11-07 ENCOUNTER — Encounter: Payer: Self-pay | Admitting: Cardiovascular Disease

## 2021-11-07 ENCOUNTER — Ambulatory Visit: Payer: HMO | Admitting: Cardiovascular Disease

## 2021-11-07 VITALS — BP 168/60 | HR 70 | Ht 64.0 in | Wt 150.4 lb

## 2021-11-07 DIAGNOSIS — I739 Peripheral vascular disease, unspecified: Secondary | ICD-10-CM

## 2021-11-07 DIAGNOSIS — E785 Hyperlipidemia, unspecified: Secondary | ICD-10-CM

## 2021-11-07 DIAGNOSIS — I10 Essential (primary) hypertension: Secondary | ICD-10-CM | POA: Diagnosis not present

## 2021-11-07 NOTE — Progress Notes (Unsigned)
Cardiology Office Note   Date:  11/08/2021   ID:  [name], DOB 09-29-1935, MRN [id number]  PCP:  Pincus Sanes, MD  Cardiologist:   Lorine Bears, MD   No chief complaint on file.      History of Present Illness: Kayla Craig is a 86 y.o. female who is here today for follow-up visit regarding peripheral arterial disease. She has no prior cardiac history.  She has multiple chronic medical conditions including essential hypertension, type 2 diabetes, hyperlipidemia, GERD and osteoarthritis.  She suffers from hammertoes on both sides but with significant discomfort affecting the right second toe with some discoloration.  She was initially planning to have surgery done and was referred for vascular evaluation before that. She underwent noninvasive vascular evaluation in August 2021 which showed an ABI of 0.78 on the right and 0.65 on the left. Duplex showed severe stenosis in the right popliteal artery with occluded posterior tibial artery.  On the left, there was also severe stenosis in the popliteal artery extending into the TP trunk with occluded posterior tibial artery.  She also has pulmonary fibrosis followed by pulmonary.  She had a Lexiscan Myoview in July 2022 that showed no evidence of ischemia with normal ejection fraction.  She has been doing reasonably well with stable shortness of breath.  She reports no energy.  She has no leg claudication.  No lower extremity ulceration.  Past Medical History:  Diagnosis Date   B12 deficiency    Back pain    Chronic   Goiter    Grief    Grief and loss related depression '08   Hypertension    TMJ (dislocation of temporomandibular joint)    Right Side   Vitamin D deficiency     Past Surgical History:  Procedure Laterality Date   EYE SURGERY  2010   cataract surgery with implants   KNEE SURGERY     Right Arthroscopy '05 Dr Charlann Boxer   THYROIDECTOMY     August '09 for goiter (Dr Jearld Fenton)   TUBAL LIGATION       Current  Outpatient Medications  Medication Sig Dispense Refill   acetaminophen (TYLENOL) 500 MG tablet Take 500 mg by mouth every 6 (six) hours as needed.     amLODipine (NORVASC) 10 MG tablet Take 1 tablet by mouth once daily 90 tablet 1   aspirin 81 MG EC tablet Take 81 mg by mouth daily.     Calcium Carbonate-Vit D-Min (CALCIUM 1200 PO) Take by mouth.     carvedilol (COREG) 6.25 MG tablet Take 1 tablet by mouth twice daily 180 tablet 3   cholecalciferol (VITAMIN D3) 25 MCG (1000 UNIT) tablet Take 1,000 Units by mouth daily.     famotidine (PEPCID) 40 MG tablet Take 1 tablet by mouth once daily 90 tablet 0   levothyroxine (SYNTHROID) 50 MCG tablet TAKE 1 TABLET BY MOUTH ONCE DAILY BEFORE BREAKFAST 90 tablet 0   losartan (COZAAR) 100 MG tablet Take 1 tablet by mouth once daily 90 tablet 0   metFORMIN (GLUCOPHAGE-XR) 500 MG 24 hr tablet Take 1 tablet by mouth once daily with breakfast 90 tablet 0   vitamin B-12 (CYANOCOBALAMIN) 1000 MCG tablet Take 1,000 mcg by mouth daily.     rosuvastatin (CRESTOR) 20 MG tablet Take 1 tablet (20 mg total) by mouth daily. 90 tablet 3   No current facility-administered medications for this visit.    Allergies:   Codeine    Social History:  The patient  reports that she has never smoked. She has been exposed to tobacco smoke. She has never used smokeless tobacco. She reports that she does not drink alcohol and does not use drugs.   Family History:  The patient's family history includes Cancer in her brother and mother; Heart disease in her brother, brother, and father.    ROS:  Please see the history of present illness.   Otherwise, review of systems are positive for none.   All other systems are reviewed and negative.    PHYSICAL EXAM: VS:  BP (!) 168/60   Pulse 70   Ht 5\' 4"  (1.626 m)   Wt 150 lb 6.4 oz (68.2 kg)   SpO2 100%   BMI 25.82 kg/m  , BMI Body mass index is 25.82 kg/m. GEN: Well nourished, well developed, in no acute distress  HEENT: normal   Neck: no JVD, carotid bruits, or masses Cardiac: RRR; no murmurs, rubs, or gallops,no edema  Respiratory:  clear to auscultation bilaterally, normal work of breathing GI: soft, nontender, nondistended, + BS MS: no deformity or atrophy  Skin: warm and dry, no rash Neuro:  Strength and sensation are intact Psych: euthymic mood, full affect Vascular: Radial pulses normal bilaterally.  Posterior tibial is not palpable.  Dorsalis pedis is +1 bilaterally.   EKG:  EKG is ordered today. EKG showed sinus rhythm with first-degree AV block with minimal LVH.  Recent Labs: 03/06/2021: Hemoglobin 13.1; Platelets 290.0; TSH 2.06 05/30/2021: NT-Pro BNP 108 08/28/2021: ALT 12; BUN 23; Creatinine, Ser 1.03; Potassium 4.0; Sodium 137    Lipid Panel    Component Value Date/Time   CHOL 157 06/20/2021 1100   TRIG 233 (H) 06/20/2021 1100   HDL 50 06/20/2021 1100   CHOLHDL 3.1 06/20/2021 1100   CHOLHDL 4 04/17/2021 1313   VLDL 60.2 (H) 03/06/2021 1447   LDLCALC 69 06/20/2021 1100   LDLDIRECT 122.0 04/17/2021 1313      Wt Readings from Last 3 Encounters:  11/07/21 150 lb 6.4 oz (68.2 kg)  10/30/21 149 lb 3.2 oz (67.7 kg)  09/13/21 152 lb (68.9 kg)          No data to display            ASSESSMENT AND PLAN:  1.  Peripheral arterial disease: The patient has evidence of severe popliteal artery stenosis bilaterally slightly worse on the left side given the extension into the TP trunk.  Currently with no symptoms.  No claudication or ulceration.  Recommend continuing medical therapy.  2.  Essential hypertension: Blood pressure is reasonably controlled on current medications.  3.  Hyperlipidemia: She was switched from pravastatin to rosuvastatin with significant improvement in lipid profile.  LDL decreased to 69 which was at target.  4.  Pulmonary fibrosis: Followed by pulmonary.     Disposition:   FU with me in 6 months  Signed,  11/13/21, MD  11/08/2021 4:27 PM    [facility]

## 2021-11-07 NOTE — Patient Instructions (Signed)
Medication Instructions:  No changes *If you need a refill on your cardiac medications before your next appointment, please call your pharmacy*   Lab Work: None ordered If you have labs (blood work) drawn today and your tests are completely normal, you will receive your results only by: MyChart Message (if you have MyChart) OR A paper copy in the mail If you have any lab test that is abnormal or we need to change your treatment, we will call you to review the results.   Testing/Procedures: None ordered   Follow-Up: At [facility], you and your health needs are our priority.  As part of our continuing mission to provide you with exceptional heart care, we have created designated Provider Care Teams.  These Care Teams include your primary Cardiologist (physician) and Advanced Practice Providers (APPs -  Physician Assistants and Nurse Practitioners) who all work together to provide you with the care you need, when you need it.  We recommend signing up for the patient portal called "MyChart".  Sign up information is provided on this After Visit Summary.  MyChart is used to connect with patients for Virtual Visits (Telemedicine).  Patients are able to view lab/test results, encounter notes, upcoming appointments, etc.  Non-urgent messages can be sent to your provider as well.   To learn more about what you can do with MyChart, go to [email or web address].    Your next appointment:   6 month(s)  The format for your next appointment:   In Person  Provider:   Dr. Arida  Important Information About Sugar       

## 2021-11-16 DIAGNOSIS — L720 Epidermal cyst: Secondary | ICD-10-CM | POA: Diagnosis not present

## 2021-11-27 ENCOUNTER — Telehealth: Payer: Self-pay | Admitting: Internal Medicine

## 2021-11-27 NOTE — Telephone Encounter (Signed)
Called and spoke with pt who states she is still coughing all throughout the day even after taking the famotidine 40mg . Pt said the cough is worse in the mornings when she first gets up but is coughing all throughout the day. Pt said that she is coughing up clear phlegm.  Routing to Dr. for him to review. Please advise on what you recommend.

## 2021-11-27 NOTE — Telephone Encounter (Signed)
Called and spoke with pt letting her know recs per MW and she verbalized understanding. Nothing further needed. 

## 2021-11-27 NOTE — Telephone Encounter (Signed)
If already taking pepcid 20 mg after bfast and supper then rec restart protonix 40 mg Take 30-60 min before first meal of the day and continue also pepcid after lunch and supper on a trial basis   Should try delsym 2 tsp every 12 hours as needed for cough in the short run, it's the strongest cough medications you can buy s rx

## 2021-11-27 NOTE — Telephone Encounter (Signed)
Pharmacy is [facility] on [address]

## 2021-11-28 ENCOUNTER — Other Ambulatory Visit: Payer: Self-pay | Admitting: Internal Medicine

## 2022-01-09 ENCOUNTER — Other Ambulatory Visit: Payer: Self-pay | Admitting: Internal Medicine

## 2022-01-24 ENCOUNTER — Other Ambulatory Visit: Payer: Self-pay | Admitting: Internal Medicine

## 2022-02-05 ENCOUNTER — Ambulatory Visit: Payer: PPO | Admitting: Internal Medicine

## 2022-02-20 ENCOUNTER — Encounter: Payer: Self-pay | Admitting: Internal Medicine

## 2022-02-20 ENCOUNTER — Ambulatory Visit (INDEPENDENT_AMBULATORY_CARE_PROVIDER_SITE_OTHER): Payer: HMO | Admitting: Internal Medicine

## 2022-02-20 VITALS — BP 128/78 | HR 77 | Temp 98.0°F | Ht 64.0 in | Wt 152.0 lb

## 2022-02-20 DIAGNOSIS — J84112 Idiopathic pulmonary fibrosis: Secondary | ICD-10-CM | POA: Diagnosis not present

## 2022-02-20 NOTE — Progress Notes (Unsigned)
Kayla Craig, female    DOB: [birth year]   MRN: [id number]   Brief patient profile:  75  yowf exposed to chemicals making eyeglasses until 2003  never smoker self- referred to pulmonary clinic 09/13/2021 for PF having failed to tol  antifibrotic rx presented with a cough p 1st covid injection ?march 2021  and coughed "for a year" before easing up spring 2023  but still present esp p supper   S/p goiter surgery  2009    History of Present Illness  09/13/2021  Pulmonary/ 1st office eval/Weber Monnier OFEV x sev weeks but stopped Aug 14 2021 UGI intol   Chief Complaint  Patient presents with   Follow-up    Pt states she has been doing okay since last visit. States after being placed on OFEV by Dr. Isaiah Serge, she felt like it took a toll on her body.  Dyspnea:  no regular walking/ holds on to buggy since covid shots  Cough: better now present p supper on famotidine 40 mg one with breakfast   Sleep: one pillow  SABA use: none  Rec Pantoprazole (protonix) 40 mg  Take  30-60 min before first meal of the day and Pepcid (famotidine)  40 mg after supper until return to office  GERD diet  Please schedule a follow up office visit in 6 weeks, call sooner if needed   10/30/2021  f/u ov/Margues Filippini re: UIP declines antifibrotic rx maint on GERD  rx but not taking consistently due to ? Cns intol Chief Complaint  Patient presents with   Follow-up    Pt states that the medicine ( pantoprazole )  she was prescribed made her feel dizzy.   Dyspnea:  limited by legs > doe  Cough: a little in am  Sleeping: bed is flat/ 2 pillows  SABA use: none  02: none  Covid status:   vax x all but last booster  Positional (non pleuritic) R lower chest/ back pain since fell  Rec Try stop  pantoprazole  and change pepcid 20 mg after bfast and after supper to see if dizziness better or breathing or coughing worse and call me when you can tell any difference to decide what next to offer.   02/20/2022  f/u ov/Maely Clements re: UIP declines  antifibrotics  maint on pepcid bid pc   Chief Complaint  Patient presents with   Follow-up    Breathing is overall doing well. She has occ cough- dry cough.    Dyspnea:  limited by feet and legs not sob/ not checking sats Cough: sporadic - daytime and not on insp or with exertion  Sleeping: bed is flat 2 pillows SABA use: none  02: none      No obvious day to day or daytime variability or assoc excess/ purulent sputum or mucus plugs or hemoptysis or cp or chest tightness, subjective wheeze or overt sinus or hb symptoms.   Sleeping  without nocturnal  or early am exacerbation  of respiratory  c/o's or need for noct saba. Also denies any obvious fluctuation of symptoms with weather or environmental changes or other aggravating or alleviating factors except as outlined above   No unusual exposure hx or h/o childhood pna/ asthma or knowledge of premature birth.  Current Allergies, Complete Past Medical History, Past Surgical History, Family History, and Social History were reviewed in [employer] record.  ROS  The following are not active complaints unless bolded Hoarseness, sore throat, dysphagia, dental problems, itching, sneezing,  nasal  congestion or discharge of excess mucus or purulent secretions, ear ache,   fever, chills, sweats, unintended wt loss or wt gain, classically pleuritic or exertional cp,  orthopnea pnd or arm/hand swelling  or leg swelling, presyncope, palpitations, abdominal pain, anorexia, nausea, vomiting, diarrhea  or change in bowel habits or change in bladder habits, change in stools or change in urine, dysuria, hematuria,  rash, arthralgias, visual complaints, headache, numbness, weakness or ataxia or problems with walking or coordination,  change in mood or  memory.        Current Meds  Medication Sig   acetaminophen (TYLENOL) 500 MG tablet Take 500 mg by mouth every 6 (six) hours as needed.   amLODipine (NORVASC) 10 MG tablet Take 1 tablet  by mouth once daily   aspirin 81 MG EC tablet Take 81 mg by mouth daily.   Calcium Carbonate-Vit D-Min (CALCIUM 1200 PO) Take by mouth.   carvedilol (COREG) 6.25 MG tablet Take 1 tablet by mouth twice daily   cholecalciferol (VITAMIN D3) 25 MCG (1000 UNIT) tablet Take 1,000 Units by mouth daily.   famotidine (PEPCID) 20 MG tablet Take 20 mg by mouth 2 (two) times daily.   levothyroxine (SYNTHROID) 50 MCG tablet TAKE 1 TABLET BY MOUTH ONCE DAILY BEFORE BREAKFAST   losartan (COZAAR) 100 MG tablet Take 1 tablet by mouth once daily   metFORMIN (GLUCOPHAGE-XR) 500 MG 24 hr tablet Take 1 tablet by mouth once daily with breakfast   pantoprazole (PROTONIX) 40 MG tablet Take 40 mg by mouth daily.   rosuvastatin (CRESTOR) 20 MG tablet Take 1 tablet (20 mg total) by mouth daily.   vitamin B-12 (CYANOCOBALAMIN) 1000 MCG tablet Take 1,000 mcg by mouth daily.                           Past Medical History:  Diagnosis Date   B12 deficiency    Back pain    Chronic   Goiter    Grief    Grief and loss related depression '08   Hypertension    TMJ (dislocation of temporomandibular joint)    Right Side   Vitamin D deficiency         Objective:     02/20/2022     152  10/30/21 149 lb 3.2 oz (67.7 kg)  09/13/21 152 lb (68.9 kg)  09/05/21 150 lb (68 kg)    Vital signs reviewed  02/20/2022  - Note at rest 02 sats  99% on RA   General appearance:    amb wf/ no cough on dep insp     HEENT : Oropharynx  clear      Nasal turbinates nl    NECK :  without  apparent JVD/ palpable Nodes/TM    LUNGS: no acc muscle use,  Nl contour chest with insp crackles bases  bilaterally without cough on insp or exp maneuvers   CV:  RRR  no s3 or murmur or increase in P2, and no edema   ABD:  soft and nontender with nl inspiratory excursion in the supine position. No bruits or organomegaly appreciated   MS:  Nl gait/ ext warm without deformities Or obvious joint restrictions  calf tenderness, cyanosis  or clubbing    SKIN: warm and dry without lesions    NEURO:  alert, approp, nl sensorium with  no motor or cerebellar deficits apparent.          Assessment

## 2022-02-20 NOTE — Patient Instructions (Signed)
Make sure you check your oxygen saturation at your highest level of activity to be sure it stays over 90% and keep track of it at least once a week, more often if breathing getting worse, and let me know if losing ground.    Please schedule a follow up visit in 6  months but call sooner if needed  

## 2022-02-21 ENCOUNTER — Encounter: Payer: Self-pay | Admitting: Internal Medicine

## 2022-02-21 NOTE — Assessment & Plan Note (Signed)
Onset ? March 2021 p covid vax  Presented with "cough x one year" with cxr c/w new PF p covid vaccination but never infected to her knowledge - HRCT  08/23/21 vs 02/07/21 :1. Unchanged, moderate pulmonary fibrosis in a pattern with apical to basal gradient, featuring irregular peripheral interstitial opacity, septal thickening, subpleural bronchiolectasis, and evidence of honeycombing at the lung bases. Findings are in keeping with diagnosis of IPF and consistent with UIP per consensus guidelines: - failed to tolerate  antifibrotics spring 2023 and declined to reconsider rx at of 09/13/2021  - 09/13/2021   Walked on RA  x  3  lap(s) =  approx 750  ft  @ avg pace, stopped due to end of study s sob  with lowest 02 sats 91%  - 10/30/2021   Walked on RA  x  3  lap(s) =  approx 450  ft  @ fast pace, stopped due to end of study s sob  with lowest 02 sats 88%   - 02/20/2022   Walked on RA  x  one  lap(s) =  approx 250  ft  @ mod pace, stopped due to feet/legs hurting with lowest 02 sats 93% and no sob    Only symptom on pepcid 20 mg bid pc is cough which has improved and appears stable clinically for now and again advised "if you don't collect the dots, you can't connect the dot's" so needs to continue to track 02 sats at peak ex and reconsider PF clinic if trending down.   Discussed in detail all the  indications, usual  risks and alternatives  relative to the benefits with patient who agrees to proceed with conservative f/u with o q 6 m, sooner prn  Each maintenance medication was reviewed in detail including emphasizing most importantly the difference between maintenance and prns and under what circumstances the prns are to be triggered using an action plan format where appropriate.  Total time for H and P, chart review, counseling,  directly observing portions of ambulatory 02 saturation study/ and generating customized AVS unique to this office visit / same day charting = 21 min

## 2022-03-05 ENCOUNTER — Encounter: Payer: Self-pay | Admitting: Internal Medicine

## 2022-03-05 NOTE — Patient Instructions (Addendum)
      Blood work was ordered.   The lab is on the first floor.    Medications changes include :   stop the famotidine - you can take as needed    A referral was ordered for podiatry.     Someone will call you to schedule an appointment.    Return in about 6 months (around 09/04/2022) for Physical Exam.

## 2022-03-05 NOTE — Progress Notes (Unsigned)
      Subjective:    Patient ID: Kayla Craig, female    DOB: 1935/07/27, 86 y.o.   MRN: [id number]     HPI Mizani is here for follow up of her chronic medical problems, including htn, hypothyroid, hld, DM, IPF, GERD  ? Ever see rheum last year for pos ANA - ? repeat  Medications and allergies reviewed with patient and updated if appropriate.  Current Outpatient Medications on File Prior to Visit  Medication Sig Dispense Refill   acetaminophen (TYLENOL) 500 MG tablet Take 500 mg by mouth every 6 (six) hours as needed.     amLODipine (NORVASC) 10 MG tablet Take 1 tablet by mouth once daily 90 tablet 1   aspirin 81 MG EC tablet Take 81 mg by mouth daily.     Calcium Carbonate-Vit D-Min (CALCIUM 1200 PO) Take by mouth.     carvedilol (COREG) 6.25 MG tablet Take 1 tablet by mouth twice daily 180 tablet 3   cholecalciferol (VITAMIN D3) 25 MCG (1000 UNIT) tablet Take 1,000 Units by mouth daily.     famotidine (PEPCID) 20 MG tablet Take 20 mg by mouth 2 (two) times daily.     levothyroxine (SYNTHROID) 50 MCG tablet TAKE 1 TABLET BY MOUTH ONCE DAILY BEFORE BREAKFAST 90 tablet 0   losartan (COZAAR) 100 MG tablet Take 1 tablet by mouth once daily 90 tablet 0   metFORMIN (GLUCOPHAGE-XR) 500 MG 24 hr tablet Take 1 tablet by mouth once daily with breakfast 90 tablet 0   pantoprazole (PROTONIX) 40 MG tablet Take 40 mg by mouth daily.     rosuvastatin (CRESTOR) 20 MG tablet Take 1 tablet (20 mg total) by mouth daily. 90 tablet 3   vitamin B-12 (CYANOCOBALAMIN) 1000 MCG tablet Take 1,000 mcg by mouth daily.     No current facility-administered medications on file prior to visit.     Review of Systems     Objective:  There were no vitals filed for this visit. BP Readings from Last 3 Encounters:  02/20/22 128/78  11/07/21 (!) 168/60  10/30/21 134/70   Wt Readings from Last 3 Encounters:  02/20/22 152 lb (68.9 kg)  11/07/21 150 lb 6.4 oz (68.2 kg)  10/30/21 149 lb 3.2 oz (67.7 kg)    There is no height or weight on file to calculate BMI.    Physical Exam     Lab Results  Component Value Date   WBC 7.3 03/06/2021   HGB 13.1 03/06/2021   HCT 39.4 03/06/2021   PLT 290.0 03/06/2021   GLUCOSE 166 (H) 08/28/2021   CHOL 157 06/20/2021   TRIG 233 (H) 06/20/2021   HDL 50 06/20/2021   LDLDIRECT 122.0 04/17/2021   LDLCALC 69 06/20/2021   ALT 12 08/28/2021   AST 15 08/28/2021   NA 137 08/28/2021   K 4.0 08/28/2021   CL 104 08/28/2021   CREATININE 1.03 08/28/2021   BUN 23 08/28/2021   CO2 27 08/28/2021   TSH 2.06 03/06/2021   HGBA1C 5.9 03/06/2021   MICROALBUR 3.4 (H) 02/26/2020     Assessment & Plan:    See Problem List for Assessment and Plan of chronic medical problems.

## 2022-03-06 ENCOUNTER — Ambulatory Visit (INDEPENDENT_AMBULATORY_CARE_PROVIDER_SITE_OTHER): Payer: HMO | Admitting: Internal Medicine

## 2022-03-06 VITALS — BP 148/78 | HR 76 | Temp 98.0°F | Ht 64.0 in | Wt 152.0 lb

## 2022-03-06 DIAGNOSIS — L84 Corns and callosities: Secondary | ICD-10-CM

## 2022-03-06 DIAGNOSIS — K219 Gastro-esophageal reflux disease without esophagitis: Secondary | ICD-10-CM

## 2022-03-06 DIAGNOSIS — E039 Hypothyroidism, unspecified: Secondary | ICD-10-CM | POA: Diagnosis not present

## 2022-03-06 DIAGNOSIS — E118 Type 2 diabetes mellitus with unspecified complications: Secondary | ICD-10-CM

## 2022-03-06 DIAGNOSIS — I10 Essential (primary) hypertension: Secondary | ICD-10-CM

## 2022-03-06 DIAGNOSIS — E785 Hyperlipidemia, unspecified: Secondary | ICD-10-CM

## 2022-03-06 DIAGNOSIS — R42 Dizziness and giddiness: Secondary | ICD-10-CM

## 2022-03-06 DIAGNOSIS — E1169 Type 2 diabetes mellitus with other specified complication: Secondary | ICD-10-CM

## 2022-03-06 DIAGNOSIS — J84112 Idiopathic pulmonary fibrosis: Secondary | ICD-10-CM

## 2022-03-06 DIAGNOSIS — E538 Deficiency of other specified B group vitamins: Secondary | ICD-10-CM | POA: Diagnosis not present

## 2022-03-06 DIAGNOSIS — E559 Vitamin D deficiency, unspecified: Secondary | ICD-10-CM | POA: Diagnosis not present

## 2022-03-06 LAB — COMPREHENSIVE METABOLIC PANEL
ALT: 13 U/L (ref 0–35)
AST: 15 U/L (ref 0–37)
Albumin: 4.2 g/dL (ref 3.5–5.2)
Alkaline Phosphatase: 46 U/L (ref 39–117)
BUN: 25 mg/dL — ABNORMAL HIGH (ref 6–23)
CO2: 28 mEq/L (ref 19–32)
Calcium: 9.4 mg/dL (ref 8.4–10.5)
Chloride: 101 mEq/L (ref 96–112)
Creatinine, Ser: 1.05 mg/dL (ref 0.40–1.20)
GFR: 48.84 mL/min — ABNORMAL LOW (ref >60.00)
Glucose, Bld: 117 mg/dL — ABNORMAL HIGH (ref 70–99)
Potassium: 4.4 mEq/L (ref 3.5–5.1)
Sodium: 137 mEq/L (ref 135–145)
Total Bilirubin: 0.4 mg/dL (ref 0.2–1.2)
Total Protein: 7.6 g/dL (ref 6.0–8.3)

## 2022-03-06 LAB — LDL CHOLESTEROL, DIRECT: Direct LDL: 95 mg/dL

## 2022-03-06 LAB — VITAMIN D 25 HYDROXY (VIT D DEFICIENCY, FRACTURES): VITD: 35.01 ng/mL (ref 30.00–100.00)

## 2022-03-06 LAB — MICROALBUMIN / CREATININE URINE RATIO
Creatinine,U: 139.2 mg/dL
Microalb Creat Ratio: 1.9 mg/g (ref 0.0–30.0)
Microalb, Ur: 2.6 mg/dL — ABNORMAL HIGH (ref 0.0–1.9)

## 2022-03-06 LAB — LIPID PANEL
Cholesterol: 198 mg/dL (ref 0–200)
HDL: 51.1 mg/dL (ref >39.00)
Total CHOL/HDL Ratio: 4
Triglycerides: 508 mg/dL — ABNORMAL HIGH (ref 0.0–149.0)

## 2022-03-06 LAB — HEMOGLOBIN A1C: Hgb A1c MFr Bld: 6.2 % (ref 4.6–6.5)

## 2022-03-06 LAB — TSH: TSH: 2.15 u[IU]/mL (ref 0.35–5.50)

## 2022-03-06 LAB — VITAMIN B12: Vitamin B-12: 699 pg/mL (ref 211–911)

## 2022-03-06 NOTE — Assessment & Plan Note (Signed)
Intermittent Has a feeling of lightheadedness, wooziness, foggy or dizzy occasionally when she stands up States she drinks water.  Denies any specific pattern to when this occurs-transient Check routine blood work today Monitor

## 2022-03-06 NOTE — Assessment & Plan Note (Signed)
Chronic Regular exercise and healthy diet encouraged Check lipid panel  Continue Crestor 20 mg daily 

## 2022-03-06 NOTE — Assessment & Plan Note (Addendum)
Chronic GERD controlled - denies ever having GERD Continue pantoprazole 40 mg daily D/c famotidine 20 mg  - can use as needed

## 2022-03-06 NOTE — Assessment & Plan Note (Addendum)
Chronic BP slightly high here today, but better controlled when she is at other doctors visits Given some lightheadedness occasionally when she stands I do not want to make any changes to her medication today Continue amlodipine 10 mg daily, coreg 6.25 mg bid, losartan 100 mg daily cmp

## 2022-03-06 NOTE — Assessment & Plan Note (Signed)
Chronic   Lab Results  Component Value Date   HGBA1C 5.9 03/06/2021   Sugars well controlled Check A1c, urine microalbumin today Continue metformin XR 500 mg daily with breakfast-if sugars are still well-controlled can consider stopping this Stressed regular exercise, diabetic diet

## 2022-03-06 NOTE — Assessment & Plan Note (Signed)
Chronic ?Check B12 level ?

## 2022-03-06 NOTE — Assessment & Plan Note (Signed)
Chronic Taking vitamin D daily Check vitamin D level  

## 2022-03-06 NOTE — Assessment & Plan Note (Signed)
Chronic  Clinically euthyroid Check tsh and will titrate med dose if needed Currently taking levothyroxine 50 mcg daily 

## 2022-03-07 ENCOUNTER — Telehealth: Payer: Self-pay | Admitting: Internal Medicine

## 2022-03-07 LAB — CBC WITH DIFFERENTIAL/PLATELET
Basophils Absolute: 0 10*3/uL (ref 0.0–0.1)
Basophils Relative: 0.2 % (ref 0.0–3.0)
Eosinophils Absolute: 0.2 10*3/uL (ref 0.0–0.7)
Eosinophils Relative: 2.2 % (ref 0.0–5.0)
HCT: 39.5 % (ref 36.0–46.0)
Hemoglobin: 13.3 g/dL (ref 12.0–15.0)
Lymphocytes Relative: 26 % (ref 12.0–46.0)
Lymphs Abs: 2.3 10*3/uL (ref 0.7–4.0)
MCHC: 33.7 g/dL (ref 30.0–36.0)
MCV: 86.4 fl (ref 78.0–100.0)
Monocytes Absolute: 0.8 10*3/uL (ref 0.1–1.0)
Monocytes Relative: 8.6 % (ref 3.0–12.0)
Neutro Abs: 5.7 10*3/uL (ref 1.4–7.7)
Neutrophils Relative %: 63 % (ref 43.0–77.0)
Platelets: 243 10*3/uL (ref 150.0–400.0)
RBC: 4.58 Mil/uL (ref 3.87–5.11)
RDW: 14.6 % (ref 11.5–15.5)
WBC: 9 10*3/uL (ref 4.0–10.5)

## 2022-03-07 NOTE — Telephone Encounter (Signed)
N/A unable to leave a message for patient to call back to schedule Medicare Annual Wellness Visit   Last AWV  03/30/21  Please schedule at anytime with [facility] Advisor if patient calls the office back.     Any questions, please call me at [phone]

## 2022-03-22 ENCOUNTER — Other Ambulatory Visit: Payer: Self-pay | Admitting: Internal Medicine

## 2022-04-10 ENCOUNTER — Encounter: Payer: Self-pay | Admitting: Internal Medicine

## 2022-04-10 ENCOUNTER — Ambulatory Visit: Payer: HMO | Admitting: Podiatry

## 2022-04-10 DIAGNOSIS — M2041 Other hammer toe(s) (acquired), right foot: Secondary | ICD-10-CM

## 2022-04-10 NOTE — Progress Notes (Signed)
   Chief Complaint  Patient presents with   Callouses    Right foot, ball of foot     Subjective: 87 y.o. female presenting to the office today for evaluation of a symptomatic callus to the plantar aspect of the right foot.  This has been painful for several months gradual onset.  She denies a history of injury.  Presents for further treatment and evaluation   Past Medical History:  Diagnosis Date   B12 deficiency    Back pain    Chronic   Goiter    Grief    Grief and loss related depression '08   Hypertension    TMJ (dislocation of temporomandibular joint)    Right Side   Vitamin D deficiency     Past Surgical History:  Procedure Laterality Date   EYE SURGERY  2010   cataract surgery with implants   KNEE SURGERY     Right Arthroscopy '05 Dr Alvan Dame   THYROIDECTOMY     August '09 for goiter (Dr Janace Hoard)   TUBAL LIGATION      Allergies  Allergen Reactions   Codeine Nausea And Vomiting    Dizzy     Objective:  Physical Exam General: Alert and oriented x3 in no acute distress  Dermatology: Hyperkeratotic lesion(s) present on the plantar aspect of the right forefoot plantar second MTP joint. Pain on palpation with a central nucleated core noted. Skin is warm, dry and supple bilateral lower extremities. Negative for open lesions or macerations.  Vascular: Palpable pedal pulses bilaterally. No edema or erythema noted. Capillary refill within normal limits.  Neurological: Epicritic and protective threshold grossly intact bilaterally.   Musculoskeletal Exam: Pain on palpation at the keratotic lesion(s) noted.  Hammertoe hallux valgus deformity noted bilateral worse on the right  Assessment: 1.  Symptomatic callus; benign skin lesion plantar second MTP 2.  Hammertoe second digit right    Plan of Care:  1. Patient evaluated 2. Excisional debridement of keratoic lesion(s) using a chisel blade was performed without incident.  3. Dressed area with light dressing. 4.   Patient would like to continue conservative treatment for the hammertoe deformity.  We did discuss likely surgery versus surgical management and she would like to pursue conservative treatment.   5.  Patient is to return to the clinic PRN.   Edrick Kins, DPM [facility]  Dr. Edrick Kins, DPM    [address], [place] [address]                Office [phone]  Fax [phone]

## 2022-04-12 ENCOUNTER — Other Ambulatory Visit: Payer: Self-pay | Admitting: Internal Medicine

## 2022-04-18 ENCOUNTER — Ambulatory Visit: Payer: Self-pay

## 2022-05-08 ENCOUNTER — Encounter: Payer: Self-pay | Admitting: Cardiovascular Disease

## 2022-05-08 ENCOUNTER — Ambulatory Visit: Payer: PPO | Attending: Cardiovascular Disease | Admitting: Cardiovascular Disease

## 2022-05-08 VITALS — BP 180/88 | HR 68 | Ht 64.0 in | Wt 147.0 lb

## 2022-05-08 DIAGNOSIS — I739 Peripheral vascular disease, unspecified: Secondary | ICD-10-CM

## 2022-05-08 DIAGNOSIS — I10 Essential (primary) hypertension: Secondary | ICD-10-CM

## 2022-05-08 DIAGNOSIS — J8410 Pulmonary fibrosis, unspecified: Secondary | ICD-10-CM

## 2022-05-08 DIAGNOSIS — E785 Hyperlipidemia, unspecified: Secondary | ICD-10-CM

## 2022-05-08 NOTE — Progress Notes (Unsigned)
Cardiology Office Note   Date:  05/09/2022   ID:  Kayla Craig, DOB March 20, 1936, MRN [id number]  PCP:  Binnie Rail, MD  Cardiologist:   Kathlyn Sacramento, MD   No chief complaint on file.      History of Present Illness: Kayla Craig is a 87 y.o. female who is here today for follow-up visit regarding peripheral arterial disease. She has no prior cardiac history.  She has multiple chronic medical conditions including essential hypertension, type 2 diabetes, hyperlipidemia, GERD and osteoarthritis.  She suffers from hammertoes on both sides but with significant discomfort affecting the right second toe with some discoloration.  She was initially planning to have surgery done and was referred for vascular evaluation before that. She underwent noninvasive vascular evaluation in August 2021 which showed an ABI of 0.78 on the right and 0.65 on the left. Duplex showed severe stenosis in the right popliteal artery with occluded posterior tibial artery.  On the left, there was also severe stenosis in the popliteal artery extending into the TP trunk with occluded posterior tibial artery.  She also has pulmonary fibrosis followed by pulmonary.  She had a Lexiscan Myoview in July 2022 that showed no evidence of ischemia with normal ejection fraction.    She has been doing reasonably well but complains of low energy and continued cough.  No chest pain.  Her blood pressure is elevated today and she reports taking her medications regularly including amlodipine, carvedilol and losartan.   Past Medical History:  Diagnosis Date   B12 deficiency    Back pain    Chronic   Goiter    Grief    Grief and loss related depression '08   Hypertension    TMJ (dislocation of temporomandibular joint)    Right Side   Vitamin D deficiency     Past Surgical History:  Procedure Laterality Date   EYE SURGERY  2010   cataract surgery with implants   KNEE SURGERY     Right Arthroscopy '05 Dr Alvan Dame    THYROIDECTOMY     August '09 for goiter (Dr Janace Hoard)   TUBAL LIGATION       Current Outpatient Medications  Medication Sig Dispense Refill   acetaminophen (TYLENOL) 500 MG tablet Take 500 mg by mouth every 6 (six) hours as needed.     amLODipine (NORVASC) 10 MG tablet Take 1 tablet by mouth once daily 90 tablet 0   aspirin 81 MG EC tablet Take 81 mg by mouth daily.     Calcium Carbonate-Vit D-Min (CALCIUM 1200 PO) Take by mouth.     carvedilol (COREG) 6.25 MG tablet Take 1 tablet by mouth twice daily 180 tablet 3   cholecalciferol (VITAMIN D3) 25 MCG (1000 UNIT) tablet Take 1,000 Units by mouth daily.     famotidine (PEPCID) 20 MG tablet Take 20 mg by mouth 2 (two) times daily as needed.     levothyroxine (SYNTHROID) 50 MCG tablet TAKE 1 TABLET BY MOUTH ONCE DAILY BEFORE BREAKFAST 90 tablet 0   losartan (COZAAR) 100 MG tablet Take 1 tablet by mouth once daily 90 tablet 0   pantoprazole (PROTONIX) 40 MG tablet Take 40 mg by mouth daily.     vitamin B-12 (CYANOCOBALAMIN) 1000 MCG tablet Take 1,000 mcg by mouth daily.     metFORMIN (GLUCOPHAGE-XR) 500 MG 24 hr tablet Take 1 tablet by mouth once daily with breakfast 90 tablet 0   rosuvastatin (CRESTOR) 20 MG tablet Take 1  tablet (20 mg total) by mouth daily. 90 tablet 3   No current facility-administered medications for this visit.    Allergies:   Codeine    Social History:  The patient  reports that she has never smoked. She has been exposed to tobacco smoke. She has never used smokeless tobacco. She reports that she does not drink alcohol and does not use drugs.   Family History:  The patient's family history includes Cancer in her brother and mother; Heart disease in her brother, brother, and father.    ROS:  Please see the history of present illness.   Otherwise, review of systems are positive for none.   All other systems are reviewed and negative.    PHYSICAL EXAM: VS:  BP (!) 180/88 (BP Location: Left Arm, Patient Position:  Sitting, Cuff Size: Normal)   Pulse 68   Ht 5\' 4"  (1.626 m)   Wt 147 lb (66.7 kg)   SpO2 95%   BMI 25.23 kg/m  , BMI Body mass index is 25.23 kg/m. GEN: Well nourished, well developed, in no acute distress  HEENT: normal  Neck: no JVD, carotid bruits, or masses Cardiac: RRR; no murmurs, rubs, or gallops,no edema  Respiratory:  clear to auscultation bilaterally, normal work of breathing GI: soft, nontender, nondistended, + BS MS: no deformity or atrophy  Skin: warm and dry, no rash Neuro:  Strength and sensation are intact Psych: euthymic mood, full affect    EKG:  EKG is ordered today. EKG showed sinus rhythm with first-degree AV block with minimal LVH.  Recent Labs: 05/30/2021: NT-Pro BNP 108 03/06/2022: ALT 13; BUN 25; Creatinine, Ser 1.05; Hemoglobin 13.3; Platelets 243.0; Potassium 4.4; Sodium 137; TSH 2.15    Lipid Panel    Component Value Date/Time   CHOL 198 03/06/2022 1508   CHOL 157 06/20/2021 1100   TRIG (H) 03/06/2022 1508    508.0 Triglyceride is over 400; calculations on Lipids are invalid.   HDL 51.10 03/06/2022 1508   HDL 50 06/20/2021 1100   CHOLHDL 4 03/06/2022 1508   VLDL 60.2 (H) 03/06/2021 1447   LDLCALC 69 06/20/2021 1100   LDLDIRECT 95.0 03/06/2022 1508      Wt Readings from Last 3 Encounters:  05/08/22 147 lb (66.7 kg)  03/06/22 152 lb (68.9 kg)  02/20/22 152 lb (68.9 kg)          No data to display            ASSESSMENT AND PLAN:  1.  Peripheral arterial disease: The patient has evidence of severe popliteal artery stenosis bilaterally slightly worse on the left side given the extension into the TP trunk.  Currently with no symptoms.  No claudication or ulceration.  Recommend continuing medical therapy.  2.  Refractory hypertension: Her blood pressure is uncontrolled and spite of 3 antihypertensive medications.  We have to exclude renal artery stenosis considering her PAD and her age.  I requested renal artery duplex. I suggested  adding spironolactone but the patient prefers to wait until her follow-up visit.  3.  Hyperlipidemia: Continue treatment with rosuvastatin.  Most recent lipid profile showed an LDL of 95.  4.  Pulmonary fibrosis: Followed by pulmonary.     Disposition:   FU with me in 3 months  Signed,  Kathlyn Sacramento, MD  05/09/2022 4:30 PM    [place]

## 2022-05-08 NOTE — Patient Instructions (Signed)
Medication Instructions:  No changes *If you need a refill on your cardiac medications before your next appointment, please call your pharmacy*   Lab Work: None ordered If you have labs (blood work) drawn today and your tests are completely normal, you will receive your results only by: [place] (if you have MyChart) OR A paper copy in the mail If you have any lab test that is abnormal or we need to change your treatment, we will call you to review the results.   Testing/Procedures: Your physician has requested that you have a renal artery duplex. During this test, an ultrasound is used to evaluate blood flow to the kidneys. Take your medications as you usually do. This will take place at [place], [address].  No food after 11PM the night before.  Water is OK. (Don't drink liquids if you have been instructed not to for ANOTHER test). Avoid foods that produce bowel gas, for 24 hours prior to exam (see below). No breakfast, no chewing gum, no smoking or carbonated beverages. Patient may take morning medications with water. Come in for test at least 15 minutes early to register.    Follow-Up: At [facility], you and your health needs are our priority.  As part of our continuing mission to provide you with exceptional heart care, we have created designated Provider Care Teams.  These Care Teams include your primary Cardiologist (physician) and Advanced Practice Providers (APPs -  Physician Assistants and Nurse Practitioners) who all work together to provide you with the care you need, when you need it.  We recommend signing up for the patient portal called "MyChart".  Sign up information is provided on this After Visit Summary.  MyChart is used to connect with patients for Virtual Visits (Telemedicine).  Patients are able to view lab/test results, encounter notes, upcoming appointments, etc.  Non-urgent messages can be sent to your provider as well.   To learn more  about what you can do with MyChart, go to [email or web address].    Your next appointment:   3 month(s)  Provider:   Dr. Fletcher Anon

## 2022-05-09 ENCOUNTER — Other Ambulatory Visit: Payer: Self-pay | Admitting: Internal Medicine

## 2022-05-17 ENCOUNTER — Ambulatory Visit (HOSPITAL_COMMUNITY)
Admission: RE | Admit: 2022-05-17 | Discharge: 2022-05-17 | Disposition: A | Discharge status: Home / Self Care | Payer: PPO | Source: Ambulatory Visit | Attending: Cardiology | Admitting: Cardiology

## 2022-05-17 DIAGNOSIS — I10 Essential (primary) hypertension: Secondary | ICD-10-CM | POA: Diagnosis not present

## 2022-05-24 ENCOUNTER — Encounter: Payer: Self-pay | Admitting: Internal Medicine

## 2022-05-24 NOTE — Patient Instructions (Addendum)
       Medications changes include :   Advair twice daily  - rinse your mouth after with water.    Albuterol inhaler as needed      Return if symptoms worsen or fail to improve.

## 2022-05-24 NOTE — Progress Notes (Signed)
Subjective:    Patient ID: Kayla Craig, female    DOB: May 10, 1935, 87 y.o.   MRN: [id number]      HPI Kayla Craig is here for  Chief Complaint  Patient presents with   Cough    Causing back and side pain has had cough for awhile states she has bad lungs , and has been coughing up a lot of mucus , is having some dizziness    She is here for an acute visit for cough.   Her symptoms started a while ago-this is more of a chronic issue.  Her cough is productive of clear mucus.  She brings up a lot of mucus. She has pulmonary fibrosis and follows with pulmonary.  She does not feel like they have given her anything for her symptoms.  She has not tolerated 2 different medications for the pulmonary fibrosis.  She is very frustrated by the cough because it is all the time and significant.  She denies any chest tightness, wheezing and other cold symptoms.  She denies fevers or chills.  She is having some rib pain in her right back-its only 1 rib that hurts.  It is worse when she coughs so much.  She fell shortly before it started and is not sure if that is what caused it.  Medications and allergies reviewed with patient and updated if appropriate.  Current Outpatient Medications on File Prior to Visit  Medication Sig Dispense Refill   acetaminophen (TYLENOL) 500 MG tablet Take 500 mg by mouth every 6 (six) hours as needed.     amLODipine (NORVASC) 10 MG tablet Take 1 tablet by mouth once daily 90 tablet 0   aspirin 81 MG EC tablet Take 81 mg by mouth daily.     Calcium Carbonate-Vit D-Min (CALCIUM 1200 PO) Take by mouth.     carvedilol (COREG) 6.25 MG tablet Take 1 tablet by mouth twice daily 180 tablet 3   cholecalciferol (VITAMIN D3) 25 MCG (1000 UNIT) tablet Take 1,000 Units by mouth daily.     famotidine (PEPCID) 20 MG tablet Take 20 mg by mouth 2 (two) times daily as needed.     levothyroxine (SYNTHROID) 50 MCG tablet TAKE 1 TABLET BY MOUTH ONCE DAILY BEFORE BREAKFAST 90 tablet 0    losartan (COZAAR) 100 MG tablet Take 1 tablet by mouth once daily 90 tablet 0   metFORMIN (GLUCOPHAGE-XR) 500 MG 24 hr tablet Take 1 tablet by mouth once daily with breakfast 90 tablet 0   vitamin B-12 (CYANOCOBALAMIN) 1000 MCG tablet Take 1,000 mcg by mouth daily.     pantoprazole (PROTONIX) 40 MG tablet Take 40 mg by mouth daily. (Patient not taking: Reported on 05/25/2022)     No current facility-administered medications on file prior to visit.    Review of Systems  Constitutional:  Negative for chills and fever.  HENT:  Positive for postnasal drip (not often). Negative for congestion, ear pain, sinus pain and sore throat.   Respiratory:  Positive for cough (productive of clear mucus). Negative for chest tightness, shortness of breath and wheezing.   Neurological:  Positive for light-headedness (occ). Negative for headaches.       Objective:   Vitals:   05/25/22 1612  BP: 120/76  Pulse: 65  Temp: 98.6 F (37 C)  SpO2: 98%   BP Readings from Last 3 Encounters:  05/25/22 120/76  05/08/22 (!) 180/88  03/06/22 (!) 148/78   Wt Readings from Last 3 Encounters:  05/25/22  145 lb (65.8 kg)  05/08/22 147 lb (66.7 kg)  03/06/22 152 lb (68.9 kg)   Body mass index is 24.89 kg/m.    Physical Exam Constitutional:      General: She is not in acute distress.    Appearance: Normal appearance. She is not ill-appearing.  HENT:     Head: Normocephalic and atraumatic.     Right Ear: Tympanic membrane, ear canal and external ear normal.     Left Ear: Tympanic membrane, ear canal and external ear normal.     Mouth/Throat:     Mouth: Mucous membranes are moist.     Pharynx: No oropharyngeal exudate or posterior oropharyngeal erythema.  Eyes:     Conjunctiva/sclera: Conjunctivae normal.  Cardiovascular:     Rate and Rhythm: Normal rate and regular rhythm.  Pulmonary:     Effort: Pulmonary effort is normal. No respiratory distress.     Breath sounds: Rales (Bibasilar) present. No  wheezing.  Musculoskeletal:        General: Tenderness (Left posterior mid-lower rib-focal tenderness) present.     Cervical back: Neck supple. No tenderness.  Lymphadenopathy:     Cervical: No cervical adenopathy.  Skin:    General: Skin is warm and dry.  Neurological:     Mental Status: She is alert.            Assessment & Plan:    See Problem List for Assessment and Plan of chronic medical problems.

## 2022-05-25 ENCOUNTER — Encounter: Payer: Self-pay | Admitting: Internal Medicine

## 2022-05-25 ENCOUNTER — Ambulatory Visit (INDEPENDENT_AMBULATORY_CARE_PROVIDER_SITE_OTHER): Payer: PPO | Admitting: Internal Medicine

## 2022-05-25 VITALS — BP 120/76 | HR 65 | Temp 98.6°F | Ht 64.0 in | Wt 145.0 lb

## 2022-05-25 DIAGNOSIS — I10 Essential (primary) hypertension: Secondary | ICD-10-CM

## 2022-05-25 DIAGNOSIS — R0781 Pleurodynia: Secondary | ICD-10-CM | POA: Diagnosis not present

## 2022-05-25 DIAGNOSIS — R053 Chronic cough: Secondary | ICD-10-CM | POA: Diagnosis not present

## 2022-05-25 MED ORDER — ALBUTEROL SULFATE HFA 108 (90 BASE) MCG/ACT IN AERS: 2.0000 | INHALATION_SPRAY | Freq: Four times a day (QID) | RESPIRATORY_TRACT | 1 refills | Status: AC | PRN

## 2022-05-25 MED ORDER — FLUTICASONE-SALMETEROL 250-50 MCG/ACT IN AEPB: 1.0000 | INHALATION_SPRAY | Freq: Two times a day (BID) | RESPIRATORY_TRACT | 5 refills | Status: DC

## 2022-05-25 NOTE — Assessment & Plan Note (Signed)
Chronic Blood pressure is well-controlled Blood pressure was recently elevated, but seems to be better with her new regimen Continue carvedilol 6.25 mg twice daily, amlodipine 10 mg daily, losartan 100 mg daily

## 2022-05-25 NOTE — Assessment & Plan Note (Signed)
Acute Experiencing left posterior mid-lower back pain-pain is localized to 1 rib She may have injured it with a recent fall Does hurt worse with coughing which is expected Discussed treatment is pain management if she injured it with a fall even if it is broken so we will hold off on a chest x-ray at this time If is not improving will refer to sports medicine-possible adjustment may help

## 2022-05-25 NOTE — Assessment & Plan Note (Signed)
Chronic in nature Coughing up clear mucus No other cold or respiratory symptoms of concern ?  Related to interstitial pulmonary fibrosis-she is following with pulmonary No evidence of infection-no need for an abx Not sure what to try - will try inhalers to see if this gives her some relief of the cough Advair 250-50 mcg per ACT twice daily, albuterol as needed-if they are not covered we will have to substitute

## 2022-06-04 ENCOUNTER — Telehealth: Payer: Self-pay

## 2022-06-04 NOTE — Telephone Encounter (Signed)
Contacted Kayla Craig to schedule their annual wellness visit. Appointment made for 06/18/22.  [name], [place] ([facility])  [facility] [facility] [phone]

## 2022-06-11 ENCOUNTER — Encounter: Payer: Self-pay | Admitting: Internal Medicine

## 2022-06-11 NOTE — Progress Notes (Unsigned)
    Subjective:    Patient ID: Kayla Craig, female    DOB: [birth year], 87 y.o.   MRN: [id number]      HPI Meztli is here for No chief complaint on file.    Back pain -     Medications and allergies reviewed with patient and updated if appropriate.  Current Outpatient Medications on File Prior to Visit  Medication Sig Dispense Refill   acetaminophen (TYLENOL) 500 MG tablet Take 500 mg by mouth every 6 (six) hours as needed.     albuterol (VENTOLIN HFA) 108 (90 Base) MCG/ACT inhaler Inhale 2 puffs into the lungs every 6 (six) hours as needed (cough or shortness of breath). 8 g 1   amLODipine (NORVASC) 10 MG tablet Take 1 tablet by mouth once daily 90 tablet 0   aspirin 81 MG EC tablet Take 81 mg by mouth daily.     Calcium Carbonate-Vit D-Min (CALCIUM 1200 PO) Take by mouth.     carvedilol (COREG) 6.25 MG tablet Take 1 tablet by mouth twice daily 180 tablet 3   cholecalciferol (VITAMIN D3) 25 MCG (1000 UNIT) tablet Take 1,000 Units by mouth daily.     famotidine (PEPCID) 20 MG tablet Take 20 mg by mouth 2 (two) times daily as needed.     fluticasone-salmeterol (ADVAIR DISKUS) 250-50 MCG/ACT AEPB Inhale 1 puff into the lungs in the morning and at bedtime. 60 each 5   levothyroxine (SYNTHROID) 50 MCG tablet TAKE 1 TABLET BY MOUTH ONCE DAILY BEFORE BREAKFAST 90 tablet 0   losartan (COZAAR) 100 MG tablet Take 1 tablet by mouth once daily 90 tablet 0   metFORMIN (GLUCOPHAGE-XR) 500 MG 24 hr tablet Take 1 tablet by mouth once daily with breakfast 90 tablet 0   pantoprazole (PROTONIX) 40 MG tablet Take 40 mg by mouth daily. (Patient not taking: Reported on 05/25/2022)     vitamin B-12 (CYANOCOBALAMIN) 1000 MCG tablet Take 1,000 mcg by mouth daily.     No current facility-administered medications on file prior to visit.    Review of Systems     Objective:  There were no vitals filed for this visit. BP Readings from Last 3 Encounters:  05/25/22 120/76  05/08/22 (!) 180/88   03/06/22 (!) 148/78   Wt Readings from Last 3 Encounters:  05/25/22 145 lb (65.8 kg)  05/08/22 147 lb (66.7 kg)  03/06/22 152 lb (68.9 kg)   There is no height or weight on file to calculate BMI.    Physical Exam         Assessment & Plan:    See Problem List for Assessment and Plan of chronic medical problems.

## 2022-06-12 ENCOUNTER — Ambulatory Visit (INDEPENDENT_AMBULATORY_CARE_PROVIDER_SITE_OTHER): Payer: PPO

## 2022-06-12 ENCOUNTER — Ambulatory Visit (INDEPENDENT_AMBULATORY_CARE_PROVIDER_SITE_OTHER): Payer: PPO | Admitting: Internal Medicine

## 2022-06-12 VITALS — BP 124/70 | HR 65 | Temp 98.2°F | Ht 64.0 in | Wt 148.2 lb

## 2022-06-12 DIAGNOSIS — M549 Dorsalgia, unspecified: Secondary | ICD-10-CM | POA: Insufficient documentation

## 2022-06-12 DIAGNOSIS — I10 Essential (primary) hypertension: Secondary | ICD-10-CM | POA: Diagnosis not present

## 2022-06-12 DIAGNOSIS — J849 Interstitial pulmonary disease, unspecified: Secondary | ICD-10-CM

## 2022-06-12 DIAGNOSIS — R918 Other nonspecific abnormal finding of lung field: Secondary | ICD-10-CM

## 2022-06-12 DIAGNOSIS — M545 Low back pain, unspecified: Secondary | ICD-10-CM

## 2022-06-12 NOTE — Patient Instructions (Addendum)
     Have xrays downstairs.    Medications changes include :   none     Return if symptoms worsen or fail to improve.

## 2022-06-12 NOTE — Assessment & Plan Note (Signed)
Chronic Blood pressure is well-controlled Continue amlodipine 10 mg daily, Coreg 6.25 mg twice daily, losartan 100 mg daily

## 2022-06-12 NOTE — Assessment & Plan Note (Signed)
[facility]acute Started 3 to 4 weeks ago without injury .  Pain is in the right posterior-lateral mid-lower rib cage-nontender to palpation Pain is worse with movement, cough, deep breaths ?  Musculoskeletal versus pleuritic Rib and chest x-ray today

## 2022-06-18 ENCOUNTER — Ambulatory Visit (INDEPENDENT_AMBULATORY_CARE_PROVIDER_SITE_OTHER): Payer: PPO

## 2022-06-18 VITALS — Ht 64.0 in | Wt 148.0 lb

## 2022-06-18 DIAGNOSIS — Z0000 Encounter for general adult medical examination without abnormal findings: Secondary | ICD-10-CM | POA: Diagnosis not present

## 2022-06-18 NOTE — Patient Instructions (Addendum)
Kayla Craig , Thank you for taking time to come for your Medicare Wellness Visit. I appreciate your ongoing commitment to your health goals. Please review the following plan we discussed and let me know if I can assist you in the future.   These are the goals we discussed:  Goals      Acknowledge receipt of Advanced Directive package        This is a list of the screening recommended for you and due dates:  Health Maintenance  Topic Date Due   Eye exam for diabetics  Never done   Zoster (Shingles) Vaccine (1 of 2) Never done   DTaP/Tdap/Td vaccine (1 - Tdap) 05/09/2012   Complete foot exam   07/04/2022   Hemoglobin A1C  09/04/2022   Yearly kidney function blood test for diabetes  03/07/2023   Yearly kidney health urinalysis for diabetes  03/07/2023   Medicare Annual Wellness Visit  06/18/2023   DEXA scan (bone density measurement)  08/11/2023   Pneumonia Vaccine  Completed   Flu Shot  Completed   HPV Vaccine  Aged Out   COVID-19 Vaccine  Discontinued    Advanced directives: No  Conditions/risks identified: Yes  Next appointment: Follow up in one year for your annual wellness visit.   Preventive Care 16 Years and Older, Female Preventive care refers to lifestyle choices and visits with your health care provider that can promote health and wellness. What does preventive care include? A yearly physical exam. This is also called an annual well check. Dental exams once or twice a year. Routine eye exams. Ask your health care provider how often you should have your eyes checked. Personal lifestyle choices, including: Daily care of your teeth and gums. Regular physical activity. Eating a healthy diet. Avoiding tobacco and drug use. Limiting alcohol use. Practicing safe sex. Taking low-dose aspirin every day. Taking vitamin and mineral supplements as recommended by your health care provider. What happens during an annual well check? The services and screenings done by your  health care provider during your annual well check will depend on your age, overall health, lifestyle risk factors, and family history of disease. Counseling  Your health care provider may ask you questions about your: Alcohol use. Tobacco use. Drug use. Emotional well-being. Home and relationship well-being. Sexual activity. Eating habits. History of falls. Memory and ability to understand (cognition). Work and work Statistician. Reproductive health. Screening  You may have the following tests or measurements: Height, weight, and BMI. Blood pressure. Lipid and cholesterol levels. These may be checked every 5 years, or more frequently if you are over 28 years old. Skin check. Lung cancer screening. You may have this screening every year starting at age 48 if you have a 30-pack-year history of smoking and currently smoke or have quit within the past 15 years. Fecal occult blood test (FOBT) of the stool. You may have this test every year starting at age 39. Flexible sigmoidoscopy or colonoscopy. You may have a sigmoidoscopy every 5 years or a colonoscopy every 10 years starting at age 42. Hepatitis C blood test. Hepatitis B blood test. Sexually transmitted disease (STD) testing. Diabetes screening. This is done by checking your blood sugar (glucose) after you have not eaten for a while (fasting). You may have this done every 1-3 years. Bone density scan. This is done to screen for osteoporosis. You may have this done starting at age 78. Mammogram. This may be done every 1-2 years. Talk to your health care provider  about how often you should have regular mammograms. Talk with your health care provider about your test results, treatment options, and if necessary, the need for more tests. Vaccines  Your health care provider may recommend certain vaccines, such as: Influenza vaccine. This is recommended every year. Tetanus, diphtheria, and acellular pertussis (Tdap, Td) vaccine. You may  need a Td booster every 10 years. Zoster vaccine. You may need this after age 68. Pneumococcal 13-valent conjugate (PCV13) vaccine. One dose is recommended after age 31. Pneumococcal polysaccharide (PPSV23) vaccine. One dose is recommended after age 64. Talk to your health care provider about which screenings and vaccines you need and how often you need them. This information is not intended to replace advice given to you by your health care provider. Make sure you discuss any questions you have with your health care provider. Document Released: 04/22/2015 Document Revised: 12/14/2015 Document Reviewed: 01/25/2015 Elsevier Interactive Patient Education  2017 [place] Prevention in the Home Falls can cause injuries. They can happen to people of all ages. There are many things you can do to make your home safe and to help prevent falls. What can I do on the outside of my home? Regularly fix the edges of walkways and driveways and fix any cracks. Remove anything that might make you trip as you walk through a door, such as a raised step or threshold. Trim any bushes or trees on the path to your home. Use bright outdoor lighting. Clear any walking paths of anything that might make someone trip, such as rocks or tools. Regularly check to see if handrails are loose or broken. Make sure that both sides of any steps have handrails. Any raised decks and porches should have guardrails on the edges. Have any leaves, snow, or ice cleared regularly. Use sand or salt on walking paths during winter. Clean up any spills in your garage right away. This includes oil or grease spills. What can I do in the bathroom? Use night lights. Install grab bars by the toilet and in the tub and shower. Do not use towel bars as grab bars. Use non-skid mats or decals in the tub or shower. If you need to sit down in the shower, use a plastic, non-slip stool. Keep the floor dry. Clean up any water that spills on  the floor as soon as it happens. Remove soap buildup in the tub or shower regularly. Attach bath mats securely with double-sided non-slip rug tape. Do not have throw rugs and other things on the floor that can make you trip. What can I do in the bedroom? Use night lights. Make sure that you have a light by your bed that is easy to reach. Do not use any sheets or blankets that are too big for your bed. They should not hang down onto the floor. Have a firm chair that has side arms. You can use this for support while you get dressed. Do not have throw rugs and other things on the floor that can make you trip. What can I do in the kitchen? Clean up any spills right away. Avoid walking on wet floors. Keep items that you use a lot in easy-to-reach places. If you need to reach something above you, use a strong step stool that has a grab bar. Keep electrical cords out of the way. Do not use floor polish or wax that makes floors slippery. If you must use wax, use non-skid floor wax. Do not have throw rugs and  other things on the floor that can make you trip. What can I do with my stairs? Do not leave any items on the stairs. Make sure that there are handrails on both sides of the stairs and use them. Fix handrails that are broken or loose. Make sure that handrails are as long as the stairways. Check any carpeting to make sure that it is firmly attached to the stairs. Fix any carpet that is loose or worn. Avoid having throw rugs at the top or bottom of the stairs. If you do have throw rugs, attach them to the floor with carpet tape. Make sure that you have a light switch at the top of the stairs and the bottom of the stairs. If you do not have them, ask someone to add them for you. What else can I do to help prevent falls? Wear shoes that: Do not have high heels. Have rubber bottoms. Are comfortable and fit you well. Are closed at the toe. Do not wear sandals. If you use a stepladder: Make sure  that it is fully opened. Do not climb a closed stepladder. Make sure that both sides of the stepladder are locked into place. Ask someone to hold it for you, if possible. Clearly mark and make sure that you can see: Any grab bars or handrails. First and last steps. Where the edge of each step is. Use tools that help you move around (mobility aids) if they are needed. These include: Canes. Walkers. Scooters. Crutches. Turn on the lights when you go into a dark area. Replace any light bulbs as soon as they burn out. Set up your furniture so you have a clear path. Avoid moving your furniture around. If any of your floors are uneven, fix them. If there are any pets around you, be aware of where they are. Review your medicines with your doctor. Some medicines can make you feel dizzy. This can increase your chance of falling. Ask your doctor what other things that you can do to help prevent falls. This information is not intended to replace advice given to you by your health care provider. Make sure you discuss any questions you have with your health care provider. Document Released: 01/20/2009 Document Revised: 09/01/2015 Document Reviewed: 04/30/2014 Elsevier Interactive Patient Education  2017 [employer].

## 2022-06-18 NOTE — Progress Notes (Signed)
I connected with  Shelda Altes on 06/18/22 by a audio enabled telemedicine application and verified that I am speaking with the correct person using two identifiers.  Patient Location: Other:  Independent Living  Provider Location: Office/Clinic  I discussed the limitations of evaluation and management by telemedicine. The patient expressed understanding and agreed to proceed.  Subjective:   TYLEEN RECKERS is a 87 y.o. female who presents for Medicare Annual (Subsequent) preventive examination.  Review of Systems     Cardiac Risk Factors include: advanced age (>38mn, >>50women);family history of premature cardiovascular disease;sedentary lifestyle;hypertension     Objective:    Today's Vitals   06/18/22 1445  Weight: 148 lb (67.1 kg)  Height: '5\' 4"'$  (1.626 m)  PainSc: 8   PainLoc: Back   Body mass index is 25.4 kg/m.     06/18/2022    2:52 PM 03/30/2021   10:07 AM 02/18/2020   11:28 AM 01/28/2018    9:38 AM 01/25/2017    9:41 AM 12/14/2014   10:31 AM 12/27/2012    1:40 AM  Advanced Directives  Does Patient Have a Medical Advance Directive? No Yes Yes Yes Yes Yes Patient does not have advance directive  Type of Advance Directive  Living will Living will [place]    Does patient want to make changes to medical advance directive?  No - Patient declined No - Patient declined      Copy of [place] Chart?    No - copy requested No - copy requested Yes   Would patient like information on creating a medical advance directive? No - Patient declined        Pre-existing out of facility DNR order (yellow form or pink MOST form)       No    Current Medications (verified) Outpatient Encounter Medications as of 06/18/2022  Medication Sig   acetaminophen (TYLENOL) 500 MG tablet Take 500 mg by mouth every 6 (six) hours as needed.   albuterol (VENTOLIN HFA) 108 (90 Base) MCG/ACT inhaler  Inhale 2 puffs into the lungs every 6 (six) hours as needed (cough or shortness of breath).   amLODipine (NORVASC) 10 MG tablet Take 1 tablet by mouth once daily   aspirin 81 MG EC tablet Take 81 mg by mouth daily.   Calcium Carbonate-Vit D-Min (CALCIUM 1200 PO) Take by mouth.   carvedilol (COREG) 6.25 MG tablet Take 1 tablet by mouth twice daily   cholecalciferol (VITAMIN D3) 25 MCG (1000 UNIT) tablet Take 1,000 Units by mouth daily.   famotidine (PEPCID) 20 MG tablet Take 20 mg by mouth 2 (two) times daily as needed.   fluticasone-salmeterol (ADVAIR DISKUS) 250-50 MCG/ACT AEPB Inhale 1 puff into the lungs in the morning and at bedtime.   levothyroxine (SYNTHROID) 50 MCG tablet TAKE 1 TABLET BY MOUTH ONCE DAILY BEFORE BREAKFAST   losartan (COZAAR) 100 MG tablet Take 1 tablet by mouth once daily   metFORMIN (GLUCOPHAGE-XR) 500 MG 24 hr tablet Take 1 tablet by mouth once daily with breakfast   pantoprazole (PROTONIX) 40 MG tablet Take 40 mg by mouth daily.   vitamin B-12 (CYANOCOBALAMIN) 1000 MCG tablet Take 1,000 mcg by mouth daily.   No facility-administered encounter medications on file as of 06/18/2022.    Allergies (verified) Codeine   History: Past Medical History:  Diagnosis Date   B12 deficiency    Back pain    Chronic  Goiter    Grief    Grief and loss related depression '08   Hypertension    TMJ (dislocation of temporomandibular joint)    Right Side   Vitamin D deficiency    Past Surgical History:  Procedure Laterality Date   EYE SURGERY  06/28/2008   cataract surgery with implants   KNEE SURGERY     Right Arthroscopy '05 Dr Alvan Dame   THYROIDECTOMY     August '09 for goiter (Dr Janace Hoard)   TUBAL LIGATION     Family History  Problem Relation Age of Onset   Cancer Mother    Heart disease Father        CAD/MI   Heart disease Brother        CAD/MI   Heart disease Brother        CAD/MI   Cancer Brother    Diabetes Neg Hx    Social History   Socioeconomic History    Marital status: Single    Spouse name: Not on file   Number of children: 2   Years of education: 12   Highest education level: Not on file  Occupational History   Occupation: retired    Fish farm manager: RETIRED  Tobacco Use   Smoking status: Never    Passive exposure: Past   Smokeless tobacco: Never  Vaping Use   Vaping Use: Never used  Substance and Sexual Activity   Alcohol use: No    Alcohol/week: 0.0 standard drinks of alcohol   Drug use: Never   Sexual activity: Never  Other Topics Concern   Not on file  Social History Narrative   HSG. [place]. 2 sons- 06/28/2060 (died lung cancer 18-Jun-2022), 2067-06-29; 3 grandchildren. 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 Lives alone: I-ADLS, lives in Sr Citizen's complex.. End of life issues: patient would want CPR, DNI, not to be maintained in a vegative state. Laymen's guide packet provided (july '11)   Social Determinants of Health   Financial Resource Strain: Low Risk  (06/18/2022)   Overall Financial Resource Strain (CARDIA)    Difficulty of Paying Living Expenses: Not hard at all  Food Insecurity: No Food Insecurity (06/18/2022)   Hunger Vital Sign    Worried About Running Out of Food in the Last Year: Never true    Ran Out of Food in the Last Year: Never true  Transportation Needs: No Transportation Needs (06/18/2022)   PRAPARE - Hydrologist (Medical): No    Lack of Transportation (Non-Medical): No  Physical Activity: Inactive (06/18/2022)   Exercise Vital Sign    Days of Exercise per Week: 0 days    Minutes of Exercise per Session: 0 min  Stress: No Stress Concern Present (06/18/2022)   [place]    Feeling of Stress : Not at all  Social Connections: Moderately Isolated (06/18/2022)   Social Connection and Isolation Panel [NHANES]    Frequency of Communication with Friends and Family: More than three times a  week    Frequency of Social Gatherings with Friends and Family: More than three times a week    Attends Religious Services: Never    Marine scientist or Organizations: Yes    Attends Archivist Meetings: Never    Marital Status: Never married    Tobacco Counseling Counseling given: Not Answered   Clinical Intake:  Pre-visit preparation completed: Yes  Pain : No/denies pain Pain Score:  8      BMI - recorded: 25.4 Nutritional Status: BMI 25 -29 Overweight Nutritional Risks: None Diabetes: Yes CBG done?: No Did pt. bring in CBG monitor from home?: No  How often do you need to have someone help you when you read instructions, pamphlets, or other written materials from your doctor or pharmacy?: 1 - Never What is the last grade level you completed in school?: HSG  Nutrition Risk Assessment:  Has the patient had any N/V/D within the last 2 months?  No  Does the patient have any non-healing wounds?  No  Has the patient had any unintentional weight loss or weight gain?  No   Diabetes:  Is the patient diabetic?  Yes  If diabetic, was a CBG obtained today?  No  Did the patient bring in their glucometer from home?  No  How often do you monitor your CBG's? No.   Financial Strains and Diabetes Management:  Are you having any financial strains with the device, your supplies or your medication? No .  Does the patient want to be seen by Chronic Care Management for management of their diabetes?  No  Would the patient like to be referred to a Nutritionist or for Diabetic Management?  No   Diabetic Exams:  Diabetic Eye Exam: Completed 10/23/2021 Diabetic Foot Exam: Completed 07/03/2021   Interpreter Needed?: No  Information entered by :: Lisette Abu, LPN.   Activities of Daily Living    06/18/2022    2:53 PM  In your present state of health, do you have any difficulty performing the following activities:  Hearing? 0  Vision? 0  Difficulty  concentrating or making decisions? 0  Walking or climbing stairs? 0  Dressing or bathing? 0  Doing errands, shopping? 0  Preparing Food and eating ? N  Using the Toilet? N  In the past six months, have you accidently leaked urine? N  Do you have problems with loss of bowel control? N  Managing your Medications? N  Managing your Finances? N  Housekeeping or managing your Housekeeping? N    Patient Care Team: Binnie Rail, MD as PCP - General (Internal Medicine) Calvert Cantor, MD (Ophthalmology) Paralee Cancel, MD (Orthopedic Surgery) Szabat, Darnelle Maffucci, [facility] (Inactive) as Pharmacist (Pharmacist)  Indicate any recent Medical Services you may have received from other than [facility] providers in the past year (date may be approximate).     Assessment:   This is a routine wellness examination for [place].  Hearing/Vision screen Hearing Screening - Comments:: Denies hearing difficulties   Vision Screening - Comments:: Wears rx glasses - up to date with routine eye exams with [facility] Gala Romney, MD.)   Dietary issues and exercise activities discussed: Current Exercise Habits: The patient does not participate in regular exercise at present, Exercise limited by: orthopedic condition(s);Other - see comments (fatigue)   Goals Addressed             This Visit's Progress    Acknowledge receipt of Advanced Directive package        Depression Screen    06/18/2022    2:59 PM 06/12/2022    1:06 PM 05/25/2022    4:11 PM 03/06/2022    1:17 PM 03/30/2021   10:11 AM 02/18/2020   11:21 AM 02/17/2019    1:00 PM  PHQ 2/9 Scores  PHQ - 2 Score 1 1 0 0 0 0 0  PHQ- 9 Score 3 3 1  Fall Risk    06/12/2022    1:06 PM 05/25/2022    4:11 PM 03/06/2022    1:23 PM 03/06/2022    1:16 PM 03/30/2021   10:08 AM  Fall Risk   Falls in the past year? 0 1 0 0 1  Number falls in past yr: 0 0 0 0 0  Injury with Fall? 0 1 0 0 0  Risk for fall due to : No Fall Risks History of fall(s)  No Fall Risks No Fall Risks No Fall Risks  Follow up Falls evaluation completed Falls evaluation completed Falls evaluation completed Falls evaluation completed Falls prevention discussed    FALL RISK PREVENTION PERTAINING TO THE HOME:  Any stairs in or around the home? No  If so, are there any without handrails? No  Home free of loose throw rugs in walkways, pet beds, electrical cords, etc? Yes  Adequate lighting in your home to reduce risk of falls? Yes   ASSISTIVE DEVICES UTILIZED TO PREVENT FALLS:  Life alert? Yes  Use of a cane, walker or w/c? No  Grab bars in the bathroom? Yes  Shower chair or bench in shower? Yes  Elevated toilet seat or a handicapped toilet? Yes   TIMED UP AND GO:  Was the test performed? No . Telephonic Visit  Cognitive Function:    01/28/2018   12:44 PM  MMSE - Mini Mental State Exam  Orientation to time 5  Orientation to Place 5  Registration 3  Attention/ Calculation 4  Recall 1  Language- name 2 objects 2  Language- repeat 1  Language- follow 3 step command 3  Language- read & follow direction 1  Write a sentence 1  Copy design 1  Total score 27        06/18/2022    2:53 PM  6CIT Screen  What Year? 0 points  What month? 0 points  What time? 0 points  Count back from 20 0 points  Months in reverse 0 points  Repeat phrase 0 points  Total Score 0 points    Immunizations Immunization History  Administered Date(s) Administered   Fluad Quad(high Dose 65+) 12/12/2018   Influenza Whole 01/31/2009, 02/28/2012   Influenza, High Dose Seasonal PF 01/06/2018, 12/12/2018, 12/03/2020, 01/09/2022   Influenza,inj,Quad PF,6+ Mos 01/11/2014   Influenza-Unspecified 01/29/2013, 12/22/2015, 12/08/2016, 01/09/2020   Moderna Sars-Covid-2 Vaccination 06/17/2019, 07/15/2019, 03/16/2020   Pneumococcal Conjugate-13 12/14/2014   Pneumococcal Polysaccharide-23 05/08/2012   Tetanus 05/08/2012    TDAP status: Up to date  Flu Vaccine status: Up to  date  Pneumococcal vaccine status: Up to date  Covid-19 vaccine status: Completed vaccines  Qualifies for Shingles Vaccine? Yes   Zostavax completed No   Shingrix Completed?: No.    Education has been provided regarding the importance of this vaccine. Patient has been advised to call insurance company to determine out of pocket expense if they have not yet received this vaccine. Advised may also receive vaccine at local pharmacy or Health Dept. Verbalized acceptance and understanding.  Screening Tests Health Maintenance  Topic Date Due   OPHTHALMOLOGY EXAM  Never done   Zoster Vaccines- Shingrix (1 of 2) Never done   DTaP/Tdap/Td (1 - Tdap) 05/09/2012   FOOT EXAM  07/04/2022   HEMOGLOBIN A1C  09/04/2022   Diabetic kidney evaluation - eGFR measurement  03/07/2023   Diabetic kidney evaluation - Urine ACR  03/07/2023   Medicare Annual Wellness (AWV)  06/18/2023   DEXA SCAN  08/11/2023   Pneumonia  Vaccine 50+ Years old  Completed   INFLUENZA VACCINE  Completed   HPV VACCINES  Aged Out   COVID-19 Vaccine  Discontinued    Health Maintenance  Health Maintenance Due  Topic Date Due   OPHTHALMOLOGY EXAM  Never done   Zoster Vaccines- Shingrix (1 of 2) Never done   DTaP/Tdap/Td (1 - Tdap) 05/09/2012    Colorectal cancer screening: No longer required.   Mammogram status: No longer required due to age.  Bone Density status: Completed 08/10/2021. Results reflect: Bone density results: OSTEOPOROSIS. Repeat every 2 years.  Lung Cancer Screening: (Low Dose CT Chest recommended if Age 16-80 years, 30 pack-year currently smoking OR have quit w/in 15years.) does not qualify.   Lung Cancer Screening Referral: no  Additional Screening:  Hepatitis C Screening: does not qualify; Completed no  Vision Screening: Recommended annual ophthalmology exams for early detection of glaucoma and other disorders of the eye. Is the patient up to date with their annual eye exam?  Yes  Who is the  provider or what is the name of the office in which the patient attends annual eye exams? Gala Romney, MD. If pt is not established with a provider, would they like to be referred to a provider to establish care? No .   Dental Screening: Recommended annual dental exams for proper oral hygiene  Community Resource Referral / Chronic Care Management: CRR required this visit?  No   CCM required this visit?  No      Plan:     I have personally reviewed and noted the following in the patient's chart:   Medical and social history Use of alcohol, tobacco or illicit drugs  Current medications and supplements including opioid prescriptions. Patient is not currently taking opioid prescriptions. Functional ability and status Nutritional status Physical activity Advanced directives List of other physicians Hospitalizations, surgeries, and ER visits in previous 12 months Vitals Screenings to include cognitive, depression, and falls Referrals and appointments  In addition, I have reviewed and discussed with patient certain preventive protocols, quality metrics, and best practice recommendations. A written personalized care plan for preventive services as well as general preventive health recommendations were provided to patient.     Sheral Flow, LPN   [id number]   Nurse Notes:  Normal cognitive status assessed by direct observation by this Nurse Health Advisor. No abnormalities found.

## 2022-06-20 ENCOUNTER — Other Ambulatory Visit: Payer: Self-pay | Admitting: Internal Medicine

## 2022-07-04 ENCOUNTER — Other Ambulatory Visit: Payer: Self-pay | Admitting: Cardiovascular Disease

## 2022-07-04 ENCOUNTER — Other Ambulatory Visit: Payer: Self-pay | Admitting: Internal Medicine

## 2022-07-04 NOTE — Telephone Encounter (Signed)
Refill Request.  

## 2022-07-05 ENCOUNTER — Telehealth: Payer: Self-pay | Admitting: Cardiovascular Disease

## 2022-07-05 ENCOUNTER — Other Ambulatory Visit: Payer: Self-pay | Admitting: Internal Medicine

## 2022-07-05 MED ORDER — ROSUVASTATIN CALCIUM 20 MG PO TABS: 20.0000 mg | ORAL_TABLET | Freq: Every day | ORAL | 3 refills | Status: DC

## 2022-07-05 NOTE — Telephone Encounter (Signed)
Yes, continue rosuvastatin.

## 2022-07-05 NOTE — Telephone Encounter (Signed)
New prescription sent to patient preferred pharmacy.    Patient aware and verbalized understanding.   Advised patient to call back to office with any issues, questions, or concerns. Patient verbalized understanding.

## 2022-07-05 NOTE — Telephone Encounter (Signed)
Returned call to patient who states that she was told by the pharmacy that Dr. Fletcher Anon discontinued the Rosuvastatin 20mg  daily that she has been on. According to chart review medication was discontinued yesterday but unsure why.   Per Dr. Tyrell Antonio last [place] note  3.  Hyperlipidemia: Continue treatment with rosuvastatin.  Most recent lipid profile showed an LDL of 95.   Advised patient I would check with Dr. Fletcher Anon and ensure okay to send in, patient aware and verbalized understanding.

## 2022-07-05 NOTE — Telephone Encounter (Signed)
Patient called and said that she went to pharmacy to get her medication and they told her that Dr. Fletcher Anon does not want her to take the [place] anymore. And the patient wants to know why

## 2022-07-10 ENCOUNTER — Ambulatory Visit
Admission: RE | Admit: 2022-07-10 | Discharge: 2022-07-10 | Disposition: A | Discharge status: Home / Self Care | Payer: PPO | Source: Ambulatory Visit | Attending: Internal Medicine | Admitting: Internal Medicine

## 2022-07-10 ENCOUNTER — Other Ambulatory Visit: Payer: Self-pay

## 2022-07-10 ENCOUNTER — Telehealth: Payer: Self-pay | Admitting: Internal Medicine

## 2022-07-10 DIAGNOSIS — I700 Atherosclerosis of aorta: Secondary | ICD-10-CM | POA: Diagnosis not present

## 2022-07-10 DIAGNOSIS — J929 Pleural plaque without asbestos: Secondary | ICD-10-CM | POA: Diagnosis not present

## 2022-07-10 DIAGNOSIS — J849 Interstitial pulmonary disease, unspecified: Secondary | ICD-10-CM

## 2022-07-10 DIAGNOSIS — J90 Pleural effusion, not elsewhere classified: Secondary | ICD-10-CM | POA: Diagnosis not present

## 2022-07-10 DIAGNOSIS — J84112 Idiopathic pulmonary fibrosis: Secondary | ICD-10-CM | POA: Diagnosis not present

## 2022-07-10 DIAGNOSIS — R918 Other nonspecific abnormal finding of lung field: Secondary | ICD-10-CM

## 2022-07-10 MED ORDER — FAMOTIDINE 20 MG PO TABS: 20.0000 mg | ORAL_TABLET | Freq: Two times a day (BID) | ORAL | 5 refills | Status: DC | PRN

## 2022-07-10 NOTE — Telephone Encounter (Signed)
Refill sent to [facility] 

## 2022-07-10 NOTE — Telephone Encounter (Signed)
Prescription Request  07/10/2022  LOV: 06/12/2022  What is the name of the medication or equipment?  famotidine (PEPCID) 20 MG tablet   Have you contacted your pharmacy to request a refill? Yes   Which pharmacy would you like this sent to?  [facility], [place] [address] Phone: [phone] Fax: [phone]    Patient notified that their request is being sent to the clinical staff for review and that they should receive a response within 2 business days.   Please advise at [facility] [phone]

## 2022-07-17 DIAGNOSIS — E663 Overweight: Secondary | ICD-10-CM | POA: Diagnosis not present

## 2022-07-17 DIAGNOSIS — E1169 Type 2 diabetes mellitus with other specified complication: Secondary | ICD-10-CM | POA: Diagnosis not present

## 2022-07-17 DIAGNOSIS — E039 Hypothyroidism, unspecified: Secondary | ICD-10-CM | POA: Diagnosis not present

## 2022-07-17 DIAGNOSIS — I700 Atherosclerosis of aorta: Secondary | ICD-10-CM | POA: Diagnosis not present

## 2022-07-17 DIAGNOSIS — I10 Essential (primary) hypertension: Secondary | ICD-10-CM | POA: Diagnosis not present

## 2022-07-17 DIAGNOSIS — K219 Gastro-esophageal reflux disease without esophagitis: Secondary | ICD-10-CM | POA: Diagnosis not present

## 2022-07-17 DIAGNOSIS — G63 Polyneuropathy in diseases classified elsewhere: Secondary | ICD-10-CM | POA: Diagnosis not present

## 2022-07-17 DIAGNOSIS — E559 Vitamin D deficiency, unspecified: Secondary | ICD-10-CM | POA: Diagnosis not present

## 2022-07-17 DIAGNOSIS — E785 Hyperlipidemia, unspecified: Secondary | ICD-10-CM | POA: Diagnosis not present

## 2022-07-17 DIAGNOSIS — G8929 Other chronic pain: Secondary | ICD-10-CM | POA: Diagnosis not present

## 2022-07-17 DIAGNOSIS — E038 Other specified hypothyroidism: Secondary | ICD-10-CM | POA: Diagnosis not present

## 2022-07-17 DIAGNOSIS — E1151 Type 2 diabetes mellitus with diabetic peripheral angiopathy without gangrene: Secondary | ICD-10-CM | POA: Diagnosis not present

## 2022-07-23 ENCOUNTER — Ambulatory Visit: Payer: PPO | Admitting: Adult Health

## 2022-07-28 ENCOUNTER — Other Ambulatory Visit: Payer: Self-pay | Admitting: Internal Medicine

## 2022-08-07 ENCOUNTER — Encounter: Payer: Self-pay | Admitting: Cardiovascular Disease

## 2022-08-07 ENCOUNTER — Ambulatory Visit: Payer: PPO | Attending: Cardiovascular Disease | Admitting: Cardiovascular Disease

## 2022-08-07 ENCOUNTER — Other Ambulatory Visit: Payer: Self-pay | Admitting: Internal Medicine

## 2022-08-07 VITALS — BP 137/74 | HR 70 | Ht 64.0 in | Wt 147.0 lb

## 2022-08-07 DIAGNOSIS — I739 Peripheral vascular disease, unspecified: Secondary | ICD-10-CM | POA: Diagnosis not present

## 2022-08-07 DIAGNOSIS — J8410 Pulmonary fibrosis, unspecified: Secondary | ICD-10-CM | POA: Diagnosis not present

## 2022-08-07 DIAGNOSIS — I10 Essential (primary) hypertension: Secondary | ICD-10-CM

## 2022-08-07 DIAGNOSIS — E785 Hyperlipidemia, unspecified: Secondary | ICD-10-CM | POA: Diagnosis not present

## 2022-08-07 NOTE — Progress Notes (Signed)
Cardiology Office Note   Date:  08/07/2022   ID:  Kayla Craig, DOB 06-09-1935, MRN [id number]  PCP:  Pincus Sanes, MD  Cardiologist:   Lorine Bears, MD   No chief complaint on file.      History of Present Illness: Kayla Craig is a 87 y.o. female who is here today for follow-up visit regarding peripheral arterial disease. She has no prior cardiac history.  She has multiple chronic medical conditions including essential hypertension, type 2 diabetes, hyperlipidemia, GERD and osteoarthritis.  She suffers from hammertoes on both sides but with significant discomfort affecting the right second toe with some discoloration.  She was initially planning to have surgery done and was referred for vascular evaluation before that. She underwent noninvasive vascular evaluation in August 2021 which showed an ABI of 0.78 on the right and 0.65 on the left. Duplex showed severe stenosis in the right popliteal artery with occluded posterior tibial artery.  On the left, there was also severe stenosis in the popliteal artery extending into the TP trunk with occluded posterior tibial artery.  She also has pulmonary fibrosis followed by pulmonary.  She had a Lexiscan Myoview in July 2022 that showed no evidence of ischemia with normal ejection fraction.    Her blood pressure was very high during last visit.  Renal artery duplex was done which showed no evidence of renal artery stenosis. She has been more consistent with taking her blood pressure medications and blood pressure now is controlled.  She denies chest pain or worsening dyspnea.  No significant lower extremity edema.  No lower extremity ulceration.  Past Medical History:  Diagnosis Date   B12 deficiency    Back pain    Chronic   Goiter    Grief    Grief and loss related depression '08   Hypertension    TMJ (dislocation of temporomandibular joint)    Right Side   Vitamin D deficiency     Past Surgical History:  Procedure  Laterality Date   EYE SURGERY  2010   cataract surgery with implants   KNEE SURGERY     Right Arthroscopy '05 Dr Charlann Boxer   THYROIDECTOMY     August '09 for goiter (Dr Jearld Fenton)   TUBAL LIGATION       Current Outpatient Medications  Medication Sig Dispense Refill   acetaminophen (TYLENOL) 500 MG tablet Take 500 mg by mouth every 6 (six) hours as needed.     albuterol (VENTOLIN HFA) 108 (90 Base) MCG/ACT inhaler Inhale 2 puffs into the lungs every 6 (six) hours as needed (cough or shortness of breath). 8 g 1   amLODipine (NORVASC) 10 MG tablet Take 1 tablet by mouth once daily 90 tablet 0   aspirin 81 MG EC tablet Take 81 mg by mouth daily.     Calcium Carbonate-Vit D-Min (CALCIUM 1200 PO) Take by mouth.     carvedilol (COREG) 6.25 MG tablet Take 1 tablet by mouth twice daily 180 tablet 3   cholecalciferol (VITAMIN D3) 25 MCG (1000 UNIT) tablet Take 1,000 Units by mouth daily.     famotidine (PEPCID) 20 MG tablet Take 1 tablet (20 mg total) by mouth 2 (two) times daily as needed. 60 tablet 5   fluticasone-salmeterol (ADVAIR DISKUS) 250-50 MCG/ACT AEPB Inhale 1 puff into the lungs in the morning and at bedtime. 60 each 5   levothyroxine (SYNTHROID) 50 MCG tablet TAKE 1 TABLET BY MOUTH ONCE DAILY BEFORE BREAKFAST 90 tablet 0  losartan (COZAAR) 100 MG tablet Take 1 tablet by mouth once daily 90 tablet 1   metFORMIN (GLUCOPHAGE-XR) 500 MG 24 hr tablet Take 1 tablet by mouth once daily with breakfast 90 tablet 0   rosuvastatin (CRESTOR) 20 MG tablet Take 1 tablet (20 mg total) by mouth daily. 90 tablet 3   vitamin B-12 (CYANOCOBALAMIN) 1000 MCG tablet Take 1,000 mcg by mouth daily.     pantoprazole (PROTONIX) 40 MG tablet Take 40 mg by mouth daily. (Patient not taking: Reported on 08/07/2022)     No current facility-administered medications for this visit.    Allergies:   Codeine    Social History:  The patient  reports that she has never smoked. She has been exposed to tobacco smoke. She has  never used smokeless tobacco. She reports that she does not drink alcohol and does not use drugs.   Family History:  The patient's family history includes Cancer in her brother and mother; Heart disease in her brother, brother, and father.    ROS:  Please see the history of present illness.   Otherwise, review of systems are positive for none.   All other systems are reviewed and negative.    PHYSICAL EXAM: VS:  BP 137/74   Pulse 70   Ht 5\' 4"  (1.626 m)   Wt 147 lb (66.7 kg)   SpO2 95%   BMI 25.23 kg/m  , BMI Body mass index is 25.23 kg/m. GEN: Well nourished, well developed, in no acute distress  HEENT: normal  Neck: no JVD, carotid bruits, or masses Cardiac: RRR; no murmurs, rubs, or gallops,no edema  Respiratory:  clear to auscultation bilaterally, normal work of breathing GI: soft, nontender, nondistended, + BS MS: no deformity or atrophy  Skin: warm and dry, no rash Neuro:  Strength and sensation are intact Psych: euthymic mood, full affect    EKG:  EKG is not ordered today.    Recent Labs: 03/06/2022: ALT 13; BUN 25; Creatinine, Ser 1.05; Hemoglobin 13.3; Platelets 243.0; Potassium 4.4; Sodium 137; TSH 2.15    Lipid Panel    Component Value Date/Time   CHOL 198 03/06/2022 1508   CHOL 157 06/20/2021 1100   TRIG (H) 03/06/2022 1508    508.0 Triglyceride is over 400; calculations on Lipids are invalid.   HDL 51.10 03/06/2022 1508   HDL 50 06/20/2021 1100   CHOLHDL 4 03/06/2022 1508   VLDL 60.2 (H) 03/06/2021 1447   LDLCALC 69 06/20/2021 1100   LDLDIRECT 95.0 03/06/2022 1508      Wt Readings from Last 3 Encounters:  08/07/22 147 lb (66.7 kg)  06/18/22 148 lb (67.1 kg)  06/12/22 148 lb 3.2 oz (67.2 kg)          No data to display            ASSESSMENT AND PLAN:  1.  Peripheral arterial disease: The patient has evidence of severe popliteal artery stenosis bilaterally slightly worse on the left side given the extension into the TP trunk.  She  continues to be asymptomatic.  Continue medical therapy.  2.  Refractory hypertension: Blood pressure is not well-controlled on current medications.  Renal artery duplex showed no evidence of renal artery stenosis.  3.  Hyperlipidemia: She was concerned that some of her back pain might be related to rosuvastatin which I doubt.  I explained to her the rationale and benefit of taking a statin drug in the setting of peripheral arterial disease.  Recommended target LDL of  less than 70.  4.  Pulmonary fibrosis: Followed by pulmonary.     Disposition:   FU with me in 6 months  Signed,  Lorine Bears, MD  08/07/2022 10:13 AM    [facility]

## 2022-08-07 NOTE — Patient Instructions (Signed)
Medication Instructions:  No changes *If you need a refill on your cardiac medications before your next appointment, please call your pharmacy*   Lab Work: None ordered If you have labs (blood work) drawn today and your tests are completely normal, you will receive your results only by: MyChart Message (if you have MyChart) OR A paper copy in the mail If you have any lab test that is abnormal or we need to change your treatment, we will call you to review the results.   Testing/Procedures: None ordered   Follow-Up: At [facility], you and your health needs are our priority.  As part of our continuing mission to provide you with exceptional heart care, we have created designated Provider Care Teams.  These Care Teams include your primary Cardiologist (physician) and Advanced Practice Providers (APPs -  Physician Assistants and Nurse Practitioners) who all work together to provide you with the care you need, when you need it.  We recommend signing up for the patient portal called "MyChart".  Sign up information is provided on this After Visit Summary.  MyChart is used to connect with patients for Virtual Visits (Telemedicine).  Patients are able to view lab/test results, encounter notes, upcoming appointments, etc.  Non-urgent messages can be sent to your provider as well.   To learn more about what you can do with MyChart, go to [email or web address].    Your next appointment:   6 month(s)  Provider:   Dr. Arida  

## 2022-08-20 NOTE — Progress Notes (Unsigned)
Kayla Craig, female    DOB: 21-Feb-1936   MRN: [id number]   Brief patient profile:  79  yowf exposed to chemicals making eyeglasses until 2003  never smoker self- referred to pulmonary clinic 09/13/2021 for PF having failed to tol  antifibrotic rx presented with a cough p 1st covid injection ?march 2021  and coughed "for a year" before easing up spring 2023  but still present esp p supper   S/p goiter surgery  2009    History of Present Illness  09/13/2021  Pulmonary/ 1st office eval/[name] OFEV x sev weeks but stopped Aug 14 2021 UGI intol   Chief Complaint  Patient presents with   Follow-up    Pt states she has been doing okay since last visit. States after being placed on OFEV by Dr. Isaiah Serge, she felt like it took a toll on her body.  Dyspnea:  no regular walking/ holds on to buggy since covid shots  Cough: better now present p supper on famotidine 40 mg one with breakfast   Sleep: one pillow  SABA use: none  Rec Pantoprazole (protonix) 40 mg  Take  30-60 min before first meal of the day and Pepcid (famotidine)  40 mg after supper until return to office  GERD diet  Please schedule a follow up office visit in 6 weeks, call sooner if needed   10/30/2021  f/u ov/[name] re: UIP declines antifibrotic rx maint on GERD  rx but not taking consistently due to ? Cns intol Chief Complaint  Patient presents with   Follow-up    Pt states that the medicine ( pantoprazole )  she was prescribed made her feel dizzy.  Rec Try stop  pantoprazole  and change pepcid 20 mg after bfast and after supper to see if dizziness better or breathing or coughing worse and call me when you can tell any difference to decide what next to offer.   02/20/2022  f/u ov/[name] re: UIP declines antifibrotics  maint on pepcid bid pc   Chief Complaint  Patient presents with   Follow-up    Breathing is overall doing well. She has occ cough- dry cough.    Dyspnea:  limited by feet and legs not sob/ not checking sats with  exertion as rec  Cough: sporadic - daytime and not on insp or with exertion  Sleeping: bed is flat 2 pillows SABA use: none  02: none  Rec Make sure you check your oxygen saturation at your highest level of activity to be sure it stays over 90%     08/21/2022  f/u ov/[name] re: ***   maint on ***  No chief complaint on file.   Dyspnea:  *** Cough: *** Sleeping: *** SABA use: *** 02: *** Covid status:   *** Lung cancer screening :  ***    No obvious day to day or daytime variability or assoc excess/ purulent sputum or mucus plugs or hemoptysis or cp or chest tightness, subjective wheeze or overt sinus or hb symptoms.   *** without nocturnal  or early am exacerbation  of respiratory  c/o's or need for noct saba. Also denies any obvious fluctuation of symptoms with weather or environmental changes or other aggravating or alleviating factors except as outlined above   No unusual exposure hx or h/o childhood pna/ asthma or knowledge of premature birth.  Current Allergies, Complete Past Medical History, Past Surgical History, Family History, and Social History were reviewed in [employer] record.  ROS  The following are not active complaints unless bolded Hoarseness, sore throat, dysphagia, dental problems, itching, sneezing,  nasal congestion or discharge of excess mucus or purulent secretions, ear ache,   fever, chills, sweats, unintended wt loss or wt gain, classically pleuritic or exertional cp,  orthopnea pnd or arm/hand swelling  or leg swelling, presyncope, palpitations, abdominal pain, anorexia, nausea, vomiting, diarrhea  or change in bowel habits or change in bladder habits, change in stools or change in urine, dysuria, hematuria,  rash, arthralgias, visual complaints, headache, numbness, weakness or ataxia or problems with walking or coordination,  change in mood or  memory.        No outpatient medications have been marked as taking for the 08/21/22  encounter (Appointment) with Nyoka Cowden, MD.                     Past Medical History:  Diagnosis Date   B12 deficiency    Back pain    Chronic   Goiter    Grief    Grief and loss related depression '08   Hypertension    TMJ (dislocation of temporomandibular joint)    Right Side   Vitamin D deficiency         Objective:    08/21/2022       *** 02/20/2022     152  10/30/21 149 lb 3.2 oz (67.7 kg)  09/13/21 152 lb (68.9 kg)  09/05/21 150 lb (68 kg)    Vital signs reviewed  08/21/2022  - Note at rest 02 sats  ***% on ***   General appearance:    ***            Assessment

## 2022-08-21 ENCOUNTER — Ambulatory Visit: Payer: PPO | Admitting: Internal Medicine

## 2022-08-21 ENCOUNTER — Ambulatory Visit (INDEPENDENT_AMBULATORY_CARE_PROVIDER_SITE_OTHER): Payer: PPO | Admitting: Internal Medicine

## 2022-08-21 ENCOUNTER — Encounter: Payer: Self-pay | Admitting: Internal Medicine

## 2022-08-21 VITALS — BP 130/78 | HR 74 | Temp 98.3°F | Ht 64.0 in | Wt 149.4 lb

## 2022-08-21 DIAGNOSIS — J84112 Idiopathic pulmonary fibrosis: Secondary | ICD-10-CM | POA: Diagnosis not present

## 2022-08-21 NOTE — Assessment & Plan Note (Addendum)
Onset ? March 2021 p covid vax  Presented with "cough x one year" with cxr c/w new PF p covid vaccination but never infected to her knowledge - HRCT  08/23/21 vs 02/07/21 :1. Unchanged, moderate pulmonary fibrosis in a pattern with apical to basal gradient, featuring irregular peripheral interstitial opacity, septal thickening, subpleural bronchiolectasis, and evidence of honeycombing at the lung bases. Findings are in keeping with diagnosis of IPF and consistent with UIP per consensus guidelines: - failed to tolerate  antifibrotics spring 2023 and declined to reconsider rx at of 09/13/2021  - 09/13/2021   Walked on RA  x  3  lap(s) =  approx 750  ft  @ avg pace, stopped due to end of study s sob  with lowest 02 sats 91%  - 10/30/2021   Walked on RA  x  3  lap(s) =  approx 450  ft  @ fast pace, stopped due to end of study s sob  with lowest 02 sats 88%   - 02/20/2022   Walked on RA  x  one  lap(s) =  approx 250  ft  @ mod pace, stopped due to feet/legs hurting with lowest 02 sats 93% and no sob   - HRCT 07/11/22  slt progression / subpleural RLL nodule 9 mm assoc with new c/o R post cp  - 08/21/2022   Walked on RA  x  3   lap(s) =  approx 750  ft  @ mod pace, stopped due to end of study  with lowest 02 sats 93% s cp or sob    She has a very indolent form of UIP and refuses referral to PF clinic  Also declined to follow recs for gerd which were repeated today in terms of lifestyle adjustments to complement 24 h acid suppression with max dose pepcid which takes the place of PPPi since says they make her sick.  The cp is not pleuritic and located well below the tiny nodule seen on hrct  but it the pain worsens  or not resolved in 3 months I would repeat a plain ct s contrast and do PET for anything over 10 mm (10 mm or less is usually not helpful and she is clearly not a good candidate for even SRT unless done for pain palliation (it doesn't disturb her sleep now and has no specific features to suggest a  pleural problem so not likely the cp and nodule are related)  Pulmonary f/u prn   Each maintenance medication was reviewed in detail including emphasizing most importantly the difference between maintenance and prns and under what circumstances the prns are to be triggered using an action plan format where appropriate.  Total time for H and P, chart review, counseling,  directly observing portions of ambulatory 02 saturation study/ and generating customized AVS unique to this office visit / same day charting  > 30 min for multiple  chest problems  some of which are of uncertain etiology

## 2022-08-21 NOTE — Patient Instructions (Addendum)
Change pepcid 20 mg twice daily after meals (automatically) may help improve your prognosis based on published studies.  GERD (REFLUX)  is an extremely common cause of respiratory symptoms just like yours , many times with no obvious heartburn at all.    It can be treated with medication, but also with lifestyle changes including elevation of the head of your bed (ideally with 6 -8inch blocks under the headboard of your bed),  Smoking cessation, avoidance of late meals, excessive alcohol, and avoid fatty foods, chocolate, peppermint, colas, red wine, and acidic juices such as orange juice.  NO MINT OR MENTHOL PRODUCTS SO NO COUGH DROPS  USE SUGARLESS CANDY INSTEAD (Jolley ranchers or Stover's or Life Savers) or even ice chips will also do - the key is to swallow to prevent all throat clearing. NO OIL BASED VITAMINS - use powdered substitutes.  Avoid fish oil when coughing.    Pulmonary follow up is as needed  - happy to refer you to the pulmonary fibrosis clinic if you or Dr Lawerance Bach desire.

## 2022-09-12 ENCOUNTER — Ambulatory Visit (INDEPENDENT_AMBULATORY_CARE_PROVIDER_SITE_OTHER): Payer: PPO | Admitting: Podiatry

## 2022-09-12 DIAGNOSIS — D2371 Other benign neoplasm of skin of right lower limb, including hip: Secondary | ICD-10-CM | POA: Diagnosis not present

## 2022-09-12 NOTE — Progress Notes (Signed)
   Chief Complaint  Patient presents with   Callouses    Patient came in today for a right forefoot callus follow-up     Subjective: 87 y.o. female presenting to the office today for follow up evaluation of a symptomatic skin lesion to the plantar aspect of the right foot second MTP.  Patient states that she feels significant relief for a few months after debridement but the lesion returns over time.  She does not go barefoot.   Past Medical History:  Diagnosis Date   B12 deficiency    Back pain    Chronic   Goiter    Grief    Grief and loss related depression '08   Hypertension    TMJ (dislocation of temporomandibular joint)    Right Side   Vitamin D deficiency     Past Surgical History:  Procedure Laterality Date   EYE SURGERY  2010   cataract surgery with implants   KNEE SURGERY     Right Arthroscopy '05 Dr Charlann Boxer   THYROIDECTOMY     August '09 for goiter (Dr Jearld Fenton)   TUBAL LIGATION      Allergies  Allergen Reactions   Codeine Nausea And Vomiting    Dizzy     Objective:  Physical Exam General: Alert and oriented x3 in no acute distress  Dermatology: Hyperkeratotic lesion(s) present on the plantar aspect of the right forefoot plantar second MTP joint. Pain on palpation with a central nucleated core noted. Skin is warm, dry and supple bilateral lower extremities. Negative for open lesions or macerations.  Vascular: Palpable pedal pulses bilaterally. No edema or erythema noted. Capillary refill within normal limits.  Neurological: Grossly intact via light touch  Musculoskeletal Exam: Pain on palpation at the keratotic lesion(s) noted.  Hammertoe of the second digit and hallux valgus deformity noted bilateral worse on the right  Assessment: 1.  Symptomatic callus; benign skin lesion plantar second MTP 2.  Hammertoe second digit right    Plan of Care:  -Patient evaluated -Excisional debridement of keratoic lesion(s) using a chisel blade was performed without  incident.  Salicylic acid applied with a light dressing -Dressed area with light dressing. -Patient would like to continue conservative treatment for the hammertoe deformity.  We did discuss likely surgery versus surgical management and she would like to pursue conservative treatment.   -Patient is to return to the clinic PRN.   Felecia Shelling, DPM [facility]  Dr. Felecia Shelling, DPM    [address] [place], [place] [address]                Office [phone]  Fax [phone]

## 2022-09-21 ENCOUNTER — Encounter: Payer: Self-pay | Admitting: Internal Medicine

## 2022-09-21 ENCOUNTER — Ambulatory Visit (INDEPENDENT_AMBULATORY_CARE_PROVIDER_SITE_OTHER): Payer: PPO | Admitting: Internal Medicine

## 2022-09-21 VITALS — BP 120/62 | HR 83 | Temp 97.9°F | Ht 64.0 in | Wt 148.0 lb

## 2022-09-21 DIAGNOSIS — M546 Pain in thoracic spine: Secondary | ICD-10-CM | POA: Diagnosis not present

## 2022-09-21 DIAGNOSIS — Z7984 Long term (current) use of oral hypoglycemic drugs: Secondary | ICD-10-CM

## 2022-09-21 DIAGNOSIS — K219 Gastro-esophageal reflux disease without esophagitis: Secondary | ICD-10-CM | POA: Diagnosis not present

## 2022-09-21 DIAGNOSIS — I10 Essential (primary) hypertension: Secondary | ICD-10-CM | POA: Diagnosis not present

## 2022-09-21 DIAGNOSIS — E039 Hypothyroidism, unspecified: Secondary | ICD-10-CM

## 2022-09-21 DIAGNOSIS — E118 Type 2 diabetes mellitus with unspecified complications: Secondary | ICD-10-CM

## 2022-09-21 LAB — LIPID PANEL
Cholesterol: 128 mg/dL (ref 0–200)
HDL: 49.6 mg/dL (ref >39.00)
NonHDL: 78.53
Total CHOL/HDL Ratio: 3
Triglycerides: 306 mg/dL — ABNORMAL HIGH (ref 0.0–149.0)
VLDL: 61.2 mg/dL — ABNORMAL HIGH (ref 0.0–40.0)

## 2022-09-21 LAB — CBC WITH DIFFERENTIAL/PLATELET
Basophils Absolute: 0 10*3/uL (ref 0.0–0.1)
Basophils Relative: 0.1 % (ref 0.0–3.0)
Eosinophils Absolute: 0.2 10*3/uL (ref 0.0–0.7)
Eosinophils Relative: 2.6 % (ref 0.0–5.0)
HCT: 38.3 % (ref 36.0–46.0)
Hemoglobin: 12.4 g/dL (ref 12.0–15.0)
Lymphocytes Relative: 26.4 % (ref 12.0–46.0)
Lymphs Abs: 1.8 10*3/uL (ref 0.7–4.0)
MCHC: 32.4 g/dL (ref 30.0–36.0)
MCV: 86.4 fl (ref 78.0–100.0)
Monocytes Absolute: 0.7 10*3/uL (ref 0.1–1.0)
Monocytes Relative: 10.4 % (ref 3.0–12.0)
Neutro Abs: 4.1 10*3/uL (ref 1.4–7.7)
Neutrophils Relative %: 60.5 % (ref 43.0–77.0)
Platelets: 256 10*3/uL (ref 150.0–400.0)
RBC: 4.43 Mil/uL (ref 3.87–5.11)
RDW: 14.2 % (ref 11.5–15.5)
WBC: 6.8 10*3/uL (ref 4.0–10.5)

## 2022-09-21 LAB — COMPREHENSIVE METABOLIC PANEL
ALT: 10 U/L (ref 0–35)
AST: 13 U/L (ref 0–37)
Albumin: 4.1 g/dL (ref 3.5–5.2)
Alkaline Phosphatase: 41 U/L (ref 39–117)
BUN: 25 mg/dL — ABNORMAL HIGH (ref 6–23)
CO2: 27 mEq/L (ref 19–32)
Calcium: 9.5 mg/dL (ref 8.4–10.5)
Chloride: 103 mEq/L (ref 96–112)
Creatinine, Ser: 1.08 mg/dL (ref 0.40–1.20)
GFR: 47.03 mL/min — ABNORMAL LOW (ref >60.00)
Glucose, Bld: 142 mg/dL — ABNORMAL HIGH (ref 70–99)
Potassium: 4.1 mEq/L (ref 3.5–5.1)
Sodium: 138 mEq/L (ref 135–145)
Total Bilirubin: 0.4 mg/dL (ref 0.2–1.2)
Total Protein: 7.5 g/dL (ref 6.0–8.3)

## 2022-09-21 LAB — HEMOGLOBIN A1C: Hgb A1c MFr Bld: 6 % (ref 4.6–6.5)

## 2022-09-21 LAB — LDL CHOLESTEROL, DIRECT: Direct LDL: 51 mg/dL

## 2022-09-21 LAB — TSH: TSH: 2.07 u[IU]/mL (ref 0.35–5.50)

## 2022-09-21 MED ORDER — BACLOFEN 5 MG PO TABS: 5.0000 mg | ORAL_TABLET | Freq: Two times a day (BID) | ORAL | 0 refills | Status: DC | PRN

## 2022-09-21 NOTE — Progress Notes (Signed)
Subjective:    Patient ID: Kayla Craig, female    DOB: November 06, 1935, 87 y.o.   MRN: [id number]      HPI Kayla Craig is here for  Chief Complaint  Patient presents with   Cough   Also here for follow up of chronic medical problems.   Bad cough,  - still coughing. Kayla Craig takes the cough medicine and it helps.  Does not feel like pulmonary is helping.    back and hip pain  -  has pain getting  up in the morning - worse pain when Kayla Craig lays down and gets up. Pulmonary told her it is not her lungs.  Feels weak at times.  Has no energy at times.   ? Sleep related .  Drinks a lot of water - can get up 6 times per night.    Scratchy throat - causes cough Sometimes get couhging after eating  Taking pepcid.  Medications and allergies reviewed with patient and updated if appropriate.  Current Outpatient Medications on File Prior to Visit  Medication Sig Dispense Refill   acetaminophen (TYLENOL) 500 MG tablet Take 500 mg by mouth every 6 (six) hours as needed.     albuterol (VENTOLIN HFA) 108 (90 Base) MCG/ACT inhaler Inhale 2 puffs into the lungs every 6 (six) hours as needed (cough or shortness of breath). 8 g 1   amLODipine (NORVASC) 10 MG tablet Take 1 tablet by mouth once daily 90 tablet 0   aspirin 81 MG EC tablet Take 81 mg by mouth daily.     Calcium Carbonate-Vit D-Min (CALCIUM 1200 PO) Take by mouth.     carvedilol (COREG) 6.25 MG tablet Take 1 tablet by mouth twice daily 180 tablet 3   cholecalciferol (VITAMIN D3) 25 MCG (1000 UNIT) tablet Take 1,000 Units by mouth daily.     famotidine (PEPCID) 20 MG tablet Take 1 tablet (20 mg total) by mouth 2 (two) times daily as needed. 60 tablet 5   levothyroxine (SYNTHROID) 50 MCG tablet TAKE 1 TABLET BY MOUTH ONCE DAILY BEFORE BREAKFAST 90 tablet 0   losartan (COZAAR) 100 MG tablet Take 1 tablet by mouth once daily 90 tablet 1   metFORMIN (GLUCOPHAGE-XR) 500 MG 24 hr tablet Take 1 tablet by mouth once daily with breakfast 90 tablet 0    rosuvastatin (CRESTOR) 20 MG tablet Take 1 tablet (20 mg total) by mouth daily. 90 tablet 3   vitamin B-12 (CYANOCOBALAMIN) 1000 MCG tablet Take 1,000 mcg by mouth daily.     No current facility-administered medications on file prior to visit.    Review of Systems  Constitutional:  Negative for fever.  Respiratory:  Positive for cough and shortness of breath (DOE). Negative for wheezing.   Cardiovascular:  Negative for chest pain, palpitations and leg swelling.  Neurological:  Positive for light-headedness (occ). Negative for headaches.       Objective:   Vitals:   09/21/22 1418  BP: 120/62  Pulse: 83  Temp: 97.9 F (36.6 C)  SpO2: 90%   BP Readings from Last 3 Encounters:  09/21/22 120/62  08/21/22 130/78  08/07/22 137/74   Wt Readings from Last 3 Encounters:  09/21/22 148 lb (67.1 kg)  08/21/22 149 lb 6.4 oz (67.8 kg)  08/07/22 147 lb (66.7 kg)   Body mass index is 25.4 kg/m.    Physical Exam Constitutional:      General: Kayla Craig is not in acute distress.    Appearance: Normal appearance.  HENT:  Head: Normocephalic and atraumatic.  Cardiovascular:     Rate and Rhythm: Normal rate and regular rhythm.  Pulmonary:     Effort: Pulmonary effort is normal. No respiratory distress.     Breath sounds: No wheezing or rales.  Musculoskeletal:        General: Tenderness (right mid back and lower back) and deformity (scoliosis) present. No swelling.     Right lower leg: No edema.     Left lower leg: No edema.  Skin:    General: Skin is warm and dry.  Neurological:     Mental Status: Kayla Craig is alert.            Assessment & Plan:    See Problem List for Assessment and Plan of chronic medical problems.

## 2022-09-21 NOTE — Patient Instructions (Addendum)
       Medications changes include :   baclofen at night for your back       Return in about 6 months (around 03/23/2023) for follow up.

## 2022-09-22 NOTE — Assessment & Plan Note (Signed)
Chronic  Clinically euthyroid Check tsh and will titrate med dose if needed Currently taking levothyroxine 50 mcg daily 

## 2022-09-22 NOTE — Assessment & Plan Note (Signed)
Chronic   Lab Results  Component Value Date   HGBA1C 6.0 09/21/2022   Sugars well controlled Check A1c Continue metformin XR 500 mg daily with breakfast Stressed diabetic diet

## 2022-09-22 NOTE — Assessment & Plan Note (Signed)
Chronic Blood pressure is well-controlled Continue amlodipine 10 mg daily, Coreg 6.25 mg twice daily, losartan 100 mg daily 

## 2022-09-22 NOTE — Assessment & Plan Note (Signed)
New Likely related to scoliosis Start baclofen 10 mg HS prn  Deferred sports medicine and PT referrals

## 2022-09-22 NOTE — Assessment & Plan Note (Signed)
Chronic ?GERD controlled ?Continue famotidine 20 mg twice daily ?

## 2022-10-03 ENCOUNTER — Other Ambulatory Visit: Payer: Self-pay | Admitting: Internal Medicine

## 2022-10-03 ENCOUNTER — Other Ambulatory Visit: Payer: Self-pay | Admitting: Cardiovascular Disease

## 2022-10-03 NOTE — Telephone Encounter (Signed)
Refill request

## 2022-10-25 DIAGNOSIS — E119 Type 2 diabetes mellitus without complications: Secondary | ICD-10-CM | POA: Diagnosis not present

## 2022-10-25 DIAGNOSIS — Z961 Presence of intraocular lens: Secondary | ICD-10-CM | POA: Diagnosis not present

## 2022-10-25 DIAGNOSIS — H35033 Hypertensive retinopathy, bilateral: Secondary | ICD-10-CM | POA: Diagnosis not present

## 2022-10-25 DIAGNOSIS — H0102A Squamous blepharitis right eye, upper and lower eyelids: Secondary | ICD-10-CM | POA: Diagnosis not present

## 2022-10-25 DIAGNOSIS — H18513 Endothelial corneal dystrophy, bilateral: Secondary | ICD-10-CM | POA: Diagnosis not present

## 2022-10-25 LAB — HM DIABETES EYE EXAM

## 2022-11-08 ENCOUNTER — Other Ambulatory Visit: Payer: Self-pay | Admitting: Internal Medicine

## 2022-11-19 ENCOUNTER — Other Ambulatory Visit: Payer: Self-pay

## 2022-11-19 ENCOUNTER — Encounter (HOSPITAL_COMMUNITY): Payer: Self-pay

## 2022-11-19 ENCOUNTER — Emergency Department (HOSPITAL_COMMUNITY)
Admission: EM | Admit: 2022-11-19 | Discharge: 2022-11-19 | Disposition: A | Discharge status: Home / Self Care | Payer: PPO | Attending: Emergency Medicine | Admitting: Emergency Medicine

## 2022-11-19 DIAGNOSIS — R58 Hemorrhage, not elsewhere classified: Secondary | ICD-10-CM | POA: Diagnosis not present

## 2022-11-19 DIAGNOSIS — Z79899 Other long term (current) drug therapy: Secondary | ICD-10-CM | POA: Diagnosis not present

## 2022-11-19 DIAGNOSIS — Z7982 Long term (current) use of aspirin: Secondary | ICD-10-CM | POA: Insufficient documentation

## 2022-11-19 DIAGNOSIS — E119 Type 2 diabetes mellitus without complications: Secondary | ICD-10-CM | POA: Insufficient documentation

## 2022-11-19 DIAGNOSIS — Z7984 Long term (current) use of oral hypoglycemic drugs: Secondary | ICD-10-CM | POA: Insufficient documentation

## 2022-11-19 DIAGNOSIS — I10 Essential (primary) hypertension: Secondary | ICD-10-CM | POA: Diagnosis not present

## 2022-11-19 DIAGNOSIS — G4489 Other headache syndrome: Secondary | ICD-10-CM | POA: Diagnosis not present

## 2022-11-19 DIAGNOSIS — R040 Epistaxis: Secondary | ICD-10-CM | POA: Insufficient documentation

## 2022-11-19 LAB — BASIC METABOLIC PANEL
Anion gap: 9 (ref 5–15)
BUN: 20 mg/dL (ref 8–23)
CO2: 23 mmol/L (ref 22–32)
Calcium: 9.5 mg/dL (ref 8.9–10.3)
Chloride: 103 mmol/L (ref 98–111)
Creatinine, Ser: 0.9 mg/dL (ref 0.44–1.00)
GFR, Estimated: >60 mL/min (ref >60)
Glucose, Bld: 113 mg/dL — ABNORMAL HIGH (ref 70–99)
Potassium: 3.8 mmol/L (ref 3.5–5.1)
Sodium: 135 mmol/L (ref 135–145)

## 2022-11-19 LAB — CBC WITH DIFFERENTIAL/PLATELET
Abs Immature Granulocytes: 0.04 10*3/uL (ref 0.00–0.07)
Basophils Absolute: 0 10*3/uL (ref 0.0–0.1)
Basophils Relative: 0 %
Eosinophils Absolute: 0.2 10*3/uL (ref 0.0–0.5)
Eosinophils Relative: 2 %
HCT: 39.3 % (ref 36.0–46.0)
Hemoglobin: 12.5 g/dL (ref 12.0–15.0)
Immature Granulocytes: 1 %
Lymphocytes Relative: 23 %
Lymphs Abs: 2 10*3/uL (ref 0.7–4.0)
MCH: 27.9 pg (ref 26.0–34.0)
MCHC: 31.8 g/dL (ref 30.0–36.0)
MCV: 87.7 fL (ref 80.0–100.0)
Monocytes Absolute: 0.8 10*3/uL (ref 0.1–1.0)
Monocytes Relative: 9 %
Neutro Abs: 5.7 10*3/uL (ref 1.7–7.7)
Neutrophils Relative %: 65 %
Platelets: 259 10*3/uL (ref 150–400)
RBC: 4.48 MIL/uL (ref 3.87–5.11)
RDW: 13.2 % (ref 11.5–15.5)
WBC: 8.7 10*3/uL (ref 4.0–10.5)
nRBC: 0 % (ref 0.0–0.2)

## 2022-11-19 MED ORDER — OXYMETAZOLINE HCL 0.05 % NA SOLN
1.0000 | Freq: Once | NASAL | Status: AC
  Administered 2022-11-19: 1 via NASAL
  Filled 2022-11-19: qty 30

## 2022-11-19 MED ORDER — LIDOCAINE-EPINEPHRINE-TETRACAINE (LET) TOPICAL GEL
3.0000 mL | Freq: Once | TOPICAL | Status: AC
  Administered 2022-11-19: 3 mL via TOPICAL
  Filled 2022-11-19: qty 3

## 2022-11-19 NOTE — ED Provider Notes (Signed)
[place] EMERGENCY DEPARTMENT AT [facility] Provider Note   CSN: [id number] Arrival date & time: 11/19/22  0051     History  Chief Complaint  Patient presents with   Epistaxis    BRENLI RAWDON is a 87 y.o. female.  The history is provided by the patient and medical records.  Epistaxis MICAL LOUCH is a 87 y.o. female who presents to the Emergency Department complaining of epistaxis.  She presents to the ED for evaluation of epistaxis.  She lives at home alone and had an episode of nosebleeding Sunday morning that lasted about 20 minutes and then resolved.  Sunday night she developed recurrent epistaxis through her left nare that she had difficulty controlling and she called 911.  She has a chronic cough, which is at her baseline.  She does not take any blood thinners.  She does have a history of diabetes and takes metformin as well as hypertension.       Home Medications Prior to Admission medications   Medication Sig Start Date End Date Taking? Authorizing Provider  acetaminophen (TYLENOL) 500 MG tablet Take 500 mg by mouth every 6 (six) hours as needed.    [provider]  albuterol (VENTOLIN HFA) 108 (90 Base) MCG/ACT inhaler Inhale 2 puffs into the lungs every 6 (six) hours as needed (cough or shortness of breath). 05/25/22   Pincus Sanes, MD  amLODipine (NORVASC) 10 MG tablet Take 1 tablet by mouth once daily 07/30/22   Pincus Sanes, MD  aspirin 81 MG EC tablet Take 81 mg by mouth daily.    [provider]  Baclofen 5 MG TABS Take 1 tablet (5 mg total) by mouth 2 (two) times daily as needed (back pain). 09/21/22   Pincus Sanes, MD  Calcium Carbonate-Vit D-Min (CALCIUM 1200 PO) Take by mouth.    [provider]  carvedilol (COREG) 6.25 MG tablet Take 1 tablet by mouth twice daily 10/03/22   Iran Ouch, MD  cholecalciferol (VITAMIN D3) 25 MCG (1000 UNIT) tablet Take 1,000 Units by mouth daily.    [provider]   famotidine (PEPCID) 20 MG tablet Take 1 tablet (20 mg total) by mouth 2 (two) times daily as needed. 07/10/22   Pincus Sanes, MD  levothyroxine (SYNTHROID) 50 MCG tablet TAKE 1 TABLET BY MOUTH ONCE DAILY BEFORE BREAKFAST 10/03/22   Pincus Sanes, MD  losartan (COZAAR) 100 MG tablet Take 1 tablet by mouth once daily 07/04/22   Pincus Sanes, MD  metFORMIN (GLUCOPHAGE-XR) 500 MG 24 hr tablet Take 1 tablet by mouth once daily with breakfast 11/08/22   Pincus Sanes, MD  rosuvastatin (CRESTOR) 20 MG tablet Take 1 tablet (20 mg total) by mouth daily. 07/05/22   Iran Ouch, MD  vitamin B-12 (CYANOCOBALAMIN) 1000 MCG tablet Take 1,000 mcg by mouth daily.    [provider]      Allergies    Codeine    Review of Systems   Review of Systems  HENT:  Positive for nosebleeds.   All other systems reviewed and are negative.   Physical Exam Updated Vital Signs BP (!) 171/88 (BP Location: Left Arm)   Pulse 66   Temp 98.2 F (36.8 C) (Oral)   Resp 16   SpO2 97%  Physical Exam Vitals and nursing note reviewed.  Constitutional:      Appearance: She is well-developed.  HENT:     Head: Normocephalic.  Comments: Dried blood in left anterior nare, no evidence of active bleeding.  No blood in posterior OP.  Cardiovascular:     Rate and Rhythm: Normal rate and regular rhythm.  Pulmonary:     Effort: Pulmonary effort is normal. No respiratory distress.  Abdominal:     Palpations: Abdomen is soft.     Tenderness: There is no abdominal tenderness. There is no guarding or rebound.  Musculoskeletal:        General: No swelling or tenderness.  Skin:    General: Skin is warm and dry.  Neurological:     Mental Status: She is alert and oriented to person, place, and time.  Psychiatric:        Behavior: Behavior normal.     ED Results / Procedures / Treatments   Labs (all labs ordered are listed, but only abnormal results are displayed) Labs Reviewed  BASIC METABOLIC PANEL -  Abnormal; Notable for the following components:      Result Value   Glucose, Bld 113 (*)    All other components within normal limits  CBC WITH DIFFERENTIAL/PLATELET  CBC WITH DIFFERENTIAL/PLATELET    EKG None  Radiology No results found.  Procedures .Epistaxis Management  Date/Time: 11/19/2022 7:00 AM  Performed by: Tilden Fossa, MD Authorized by: Tilden Fossa, MD   Consent:    Consent obtained:  Verbal   Consent given by:  Patient   Risks discussed:  Bleeding, infection, nasal injury and pain   Alternatives discussed:  No treatment Universal protocol:    Patient identity confirmed:  Verbally with patient Anesthesia:    Anesthesia method:  Topical application   Topical anesthetic:  LET Procedure details:    Treatment site:  L anterior   Treatment complexity:  Limited Post-procedure details:    Assessment:  Bleeding stopped   Procedure completion:  Tolerated well, no immediate complications     Medications Ordered in ED Medications  oxymetazoline (AFRIN) 0.05 % nasal spray 1 spray (has no administration in time range)  lidocaine-EPINEPHrine-tetracaine (LET) topical gel (3 mLs Topical Given 11/19/22 0442)    ED Course/ Medical Decision Making/ A&P                                 Medical Decision Making Amount and/or Complexity of Data Reviewed Labs: ordered.  Risk OTC drugs.   Patient with history of hypertension here for evaluation of left-sided epistaxis.  She does have a small amount of blood in the left nare.  No significant active bleeding.  She was treated with topical let and observe for another hour without any additional bleeding.  Labs with stable CBC, renal function.  Discussed with patient home care for epistaxis.  Will provide Afrin for her to take at home.  Also discussed nasal saline sprays.  Discussed PCP follow-up and return precautions.        Final Clinical Impression(s) / ED Diagnoses Final diagnoses:  Left-sided epistaxis     Rx / DC Orders ED Discharge Orders     None         Tilden Fossa, MD 11/19/22 [phone]

## 2022-11-19 NOTE — ED Triage Notes (Signed)
PT presents via EMS c/o epistaxis and HTN. Reports systolic BP in 200s on arrival. Last sysolic BP in 180s. Afrin spray x3 given by EMS.

## 2022-11-19 NOTE — ED Triage Notes (Signed)
Pt presents via EMS c/o epistaxis since this yesterday am at 0400. Reports intermittent in nature.

## 2022-11-19 NOTE — ED Notes (Signed)
Recollect sent down to lab

## 2022-11-19 NOTE — Discharge Instructions (Addendum)
You can use the afrin nasal spray, one squirt in each nostril daily for three days max. Kayla Craig an over the counter nasal saline spray and use twice daily.

## 2022-11-19 NOTE — ED Notes (Signed)
Blood work sent to lab via tube station.

## 2022-11-22 ENCOUNTER — Encounter: Payer: Self-pay | Admitting: Internal Medicine

## 2022-11-22 DIAGNOSIS — R040 Epistaxis: Secondary | ICD-10-CM | POA: Insufficient documentation

## 2022-11-22 NOTE — Patient Instructions (Addendum)
   For the nose bleeds   Use saline nasal spray in each nostril a few times a day  Get a humidifier for your bedroom or house  Use vaseline in the nostril at night to keep the mucus membrane moist  If the bleeding start you can use Afrin nasal spray and apply pressure as long as it takes to stop the bleeding.    If you continue to have recurrent bleeding please call use.      Return in 3 months (on 02/23/2023) for follow up.

## 2022-11-22 NOTE — Progress Notes (Signed)
Subjective:    Patient ID: Kayla Craig, female    DOB: Aug 30, 1935, 87 y.o.   MRN: [id number]     HPI Kayla Craig is here for follow up from the hospital  ED 8/12 for left sided epistaxis the morning prior that lasted 20 minutes and then resolved.  That night she had a recurrence that she had difficulty controlling and called 911.  Not on a/c.    In ED there was no active bleeding, dried blood in left nare.  Cbc, bmp normal.  Had topical gel and afrin spray in ED.     No additional bleeding since ED.  Using saline spray bid x few days, afrin prn.   Hips are still hurting - sometimes it is one hip sometimes both.  Chronic cough still bothers her-not sure what to do about pulmonary.  Medications and allergies reviewed with patient and updated if appropriate.  Current Outpatient Medications on File Prior to Visit  Medication Sig Dispense Refill   acetaminophen (TYLENOL) 500 MG tablet Take 500 mg by mouth every 6 (six) hours as needed.     albuterol (VENTOLIN HFA) 108 (90 Base) MCG/ACT inhaler Inhale 2 puffs into the lungs every 6 (six) hours as needed (cough or shortness of breath). 8 g 1   amLODipine (NORVASC) 10 MG tablet Take 1 tablet by mouth once daily 90 tablet 0   aspirin 81 MG EC tablet Take 81 mg by mouth daily.     Baclofen 5 MG TABS Take 1 tablet (5 mg total) by mouth 2 (two) times daily as needed (back pain). 60 tablet 0   Calcium Carbonate-Vit D-Min (CALCIUM 1200 PO) Take by mouth.     carvedilol (COREG) 6.25 MG tablet Take 1 tablet by mouth twice daily 180 tablet 3   cholecalciferol (VITAMIN D3) 25 MCG (1000 UNIT) tablet Take 1,000 Units by mouth daily.     famotidine (PEPCID) 20 MG tablet Take 1 tablet (20 mg total) by mouth 2 (two) times daily as needed. 60 tablet 5   levothyroxine (SYNTHROID) 50 MCG tablet TAKE 1 TABLET BY MOUTH ONCE DAILY BEFORE BREAKFAST 90 tablet 3   losartan (COZAAR) 100 MG tablet Take 1 tablet by mouth once daily 90 tablet 1   metFORMIN  (GLUCOPHAGE-XR) 500 MG 24 hr tablet Take 1 tablet by mouth once daily with breakfast 90 tablet 0   rosuvastatin (CRESTOR) 20 MG tablet Take 1 tablet (20 mg total) by mouth daily. 90 tablet 3   vitamin B-12 (CYANOCOBALAMIN) 1000 MCG tablet Take 1,000 mcg by mouth daily.     No current facility-administered medications on file prior to visit.     Review of Systems  HENT:  Positive for nosebleeds.   Respiratory:  Positive for cough (in morning - coughs up mucus then ok).        Objective:   Vitals:   11/23/22 1059  BP: 130/68  Pulse: 69  Temp: 98.1 F (36.7 C)  SpO2: 95%   BP Readings from Last 3 Encounters:  11/23/22 130/68  11/19/22 (!) 171/88  09/21/22 120/62   Wt Readings from Last 3 Encounters:  11/23/22 149 lb (67.6 kg)  09/21/22 148 lb (67.1 kg)  08/21/22 149 lb 6.4 oz (67.8 kg)   Body mass index is 25.58 kg/m.    Physical Exam Constitutional:      General: She is not in acute distress.    Appearance: Normal appearance.  HENT:     Head: Normocephalic  and atraumatic.     Nose: Nose normal.     Comments: No active bleeding Eyes:     Conjunctiva/sclera: Conjunctivae normal.  Cardiovascular:     Rate and Rhythm: Normal rate and regular rhythm.     Heart sounds: Normal heart sounds.  Pulmonary:     Effort: Pulmonary effort is normal. No respiratory distress.     Breath sounds: Normal breath sounds. No wheezing.  Musculoskeletal:     Right lower leg: No edema.     Left lower leg: No edema.  Skin:    General: Skin is warm and dry.     Findings: No rash.  Neurological:     Mental Status: She is alert. Mental status is at baseline.  Psychiatric:        Mood and Affect: Mood normal.        Behavior: Behavior normal.        Lab Results  Component Value Date   WBC 8.7 11/19/2022   HGB 12.5 11/19/2022   HCT 39.3 11/19/2022   PLT 259 11/19/2022   GLUCOSE 113 (H) 11/19/2022   CHOL 128 09/21/2022   TRIG 306.0 (H) 09/21/2022   HDL 49.60 09/21/2022    LDLDIRECT 51.0 09/21/2022   LDLCALC 69 06/20/2021   ALT 10 09/21/2022   AST 13 09/21/2022   NA 135 11/19/2022   K 3.8 11/19/2022   CL 103 11/19/2022   CREATININE 0.90 11/19/2022   BUN 20 11/19/2022   CO2 23 11/19/2022   TSH 2.07 09/21/2022   HGBA1C 6.0 09/21/2022   MICROALBUR 2.6 (H) 03/06/2022     Assessment & Plan:    See Problem List for Assessment and Plan of chronic medical problems.

## 2022-11-23 ENCOUNTER — Ambulatory Visit (INDEPENDENT_AMBULATORY_CARE_PROVIDER_SITE_OTHER): Payer: PPO | Admitting: Internal Medicine

## 2022-11-23 ENCOUNTER — Telehealth: Payer: Self-pay | Admitting: Internal Medicine

## 2022-11-23 VITALS — BP 130/68 | HR 69 | Temp 98.1°F | Ht 64.0 in | Wt 149.0 lb

## 2022-11-23 DIAGNOSIS — R040 Epistaxis: Secondary | ICD-10-CM | POA: Diagnosis not present

## 2022-11-23 DIAGNOSIS — G8929 Other chronic pain: Secondary | ICD-10-CM | POA: Diagnosis not present

## 2022-11-23 DIAGNOSIS — R053 Chronic cough: Secondary | ICD-10-CM | POA: Diagnosis not present

## 2022-11-23 DIAGNOSIS — M545 Low back pain, unspecified: Secondary | ICD-10-CM | POA: Diagnosis not present

## 2022-11-23 DIAGNOSIS — I10 Essential (primary) hypertension: Secondary | ICD-10-CM | POA: Diagnosis not present

## 2022-11-23 NOTE — Assessment & Plan Note (Signed)
2 episodes of nosebleed-left nare No recurrence Advised saline nasal spray, Vaseline at night if needed Consider humidifier at night in the bedroom if recurrent bleeding persist Afrin nasal spray for bleeding only Call if recurrent bleeding continues

## 2022-11-23 NOTE — Assessment & Plan Note (Signed)
Chronic Blood pressure is well-controlled-was elevated in the ED Continue amlodipine 10 mg daily, Coreg 6.25 mg twice daily, losartan 100 mg daily

## 2022-11-23 NOTE — Assessment & Plan Note (Signed)
Chronic Intermittent hip pain - may be one hip or both hips ? From back Deferred seeing a specialist Continue tylenol as needed

## 2022-11-23 NOTE — Telephone Encounter (Signed)
Patient called and said she would like to know what her blood type is. She would like a call back at [phone] to let her know.

## 2022-11-23 NOTE — Assessment & Plan Note (Addendum)
Chronic Has cough first thing in the morning-brings up of much mucus and then it resolves with rest of the day.  Also has cough first thing when she lays down at night-again clears mucus and that it goes away ?  GERD-only taking Pepcid as needed-pulmonary did ask that she take it on a regular basis ?  Related to pulmonary fibrosis Not currently seeing pulmonary-was advised to follow-up as needed and she is not sure if she needs to see them or not Continue famotidine 20 mg twice daily as needed

## 2022-11-26 ENCOUNTER — Telehealth: Payer: Self-pay

## 2022-11-26 ENCOUNTER — Other Ambulatory Visit: Payer: Self-pay | Admitting: Internal Medicine

## 2022-11-26 NOTE — Telephone Encounter (Signed)
Called and left message for patient to return call to clinic.  If she calls back please let her know we do not do lab testing to determine her blood type.  She will either need to [facility] the [facility] or if she has had recent surgery she can check with surgeon to see if a type and screen was done for blood type.

## 2022-11-26 NOTE — Telephone Encounter (Signed)
Transition Care Management Unsuccessful Follow-up Telephone Call  Date of discharge and from where:  11/19/2022 [facility]  Attempts:  1st Attempt  Reason for unsuccessful TCM follow-up call:  Unable to leave message  Siara Gorder [facility]  [facility] Population Health Community Resource Care Guide   [email or web address]  ?? [phone]   Website: [email or web address]  [email or web address]

## 2022-11-27 ENCOUNTER — Telehealth: Payer: Self-pay

## 2022-11-27 NOTE — Telephone Encounter (Signed)
Transition Care Management Follow-up Telephone Call Date of discharge and from where: 11/19/2022 [facility] How have you been since you were released from the hospital? Patient stated she is feeling better, but feeling weak and has a headache. Any questions or concerns? No  Items Reviewed: Did the pt receive and understand the discharge instructions provided? Yes  Medications obtained and verified?  No medication prescribed. Other? No  Any new allergies since your discharge? No  Dietary orders reviewed? Yes Do you have support at home? Yes   Follow up appointments reviewed:  PCP Hospital f/u appt confirmed? Yes  Scheduled to see Cheryll Cockayne, MD on 11/23/2022 @ [facility] f/u appt confirmed? No  Scheduled to see  on  @ . Are transportation arrangements needed? No  If their condition worsens, is the pt aware to call PCP or go to the Emergency Dept.? Yes Was the patient provided with contact information for the PCP's office or ED? Yes Was to pt encouraged to call back with questions or concerns? Yes  Ermine Stebbins [facility]  [facility] Population Health Community Resource Care Guide   [email or web address]  ?? [phone]   Website: [email or web address]  [email or web address]

## 2023-01-17 ENCOUNTER — Other Ambulatory Visit: Payer: Self-pay | Admitting: Internal Medicine

## 2023-02-03 NOTE — Progress Notes (Unsigned)
Cardiology Office Note:  .   Date:  02/04/2023  ID:  [name], DOB [birth year], MRN [id number] PCP: Pincus Sanes, MD  [facility] Providers Cardiologist:   Dr.Arida   History of Present Illness: .    Kayla Craig is a 87 y.o. female with history of peripheral arterial disease with severe stenosis of the right popliteal artery and occluded posterior tibial artery on the right, severe stenosis of the popliteal artery extending into the TP trunk and occluded posterior tibial artery on the left.  She was asymptomatic for claudication symptoms and is continued on medical therapy.  Pulmonary fibrosis,, hypertension, hyperlipidemia, with multiple chronic medical conditions.  Last seen by Dr. Kirke Corin on 08/07/2022 and was found to be hypertensive.  However, no medication changes were made at that time.  She is here for 68-month follow-up.  She complains of nightmares on Crestor.  She also has chronic musculoskeletal pain, cervical pain, low back pain, bilateral hip pain, and right knee pain.  She denies chest pain, dyspnea on exertion, or palpitations.  ROS: As above otherwise negative.  Studies Reviewed: Marland Kitchen   EKG Interpretation Date/Time:  Monday February 04 2023 09:22:14 EDT Ventricular Rate:  69 PR Interval:  230 QRS Duration:  100 QT Interval:  386 QTC Calculation: 413 R Axis:   -9  Text Interpretation: Sinus rhythm with 1st degree A-V block Moderate voltage criteria for LVH, may be normal variant ( R in aVL , Cornell product ) Confirmed by Joni Reining [phone]) on 02/04/2023 11:48:06 AM    Echocardiogram 04/13/2021 1. Left ventricular ejection fraction, by estimation, is 55 to 60%. Left  ventricular ejection fraction by 3D volume is 56 %. The left ventricle has  normal function. The left ventricle has no regional wall motion  abnormalities. Left ventricular diastolic   parameters are consistent with Grade I diastolic dysfunction (impaired  relaxation).   2. Mildly  reduced RV free wall strain at -17.5%. Right ventricular  systolic function is mildly reduced. The right ventricular size is normal.  There is mildly elevated pulmonary artery systolic pressure. The estimated  right ventricular systolic pressure  is 36.4 mmHg.   3. 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 Left  atrial size was mildly dilated.   4. The mitral valve is abnormal. Trivial mitral valve regurgitation.   5. The aortic valve is tricuspid. Aortic valve regurgitation is trivial.  Aortic valve sclerosis is present, with no evidence of aortic valve  stenosis. Aortic regurgitation PHT measures 547 msec.   6. The inferior vena cava is normal in size with greater than 50%  respiratory variability, suggesting right atrial pressure of 3 mmHg.    EKG Interpretation Date/Time:  Monday February 04 2023 09:22:14 EDT Ventricular Rate:  69 PR Interval:  230 QRS Duration:  100 QT Interval:  386 QTC Calculation: 413 R Axis:   -9  Text Interpretation: Sinus rhythm with 1st degree A-V block Moderate voltage criteria for LVH, may be normal variant ( R in aVL , Cornell product ) Confirmed by Joni Reining [phone]) on 02/04/2023 11:48:06 AM    Physical Exam:   VS:  BP 128/78   Pulse 69   Ht 5\' 4"  (1.626 m)   Wt 149 lb 6.4 oz (67.8 kg)   SpO2 96%   BMI 25.64 kg/m    Wt Readings from Last 3 Encounters:  02/04/23 149 lb 6.4 oz (67.8 kg)  11/23/22 149 lb (67.6 kg)  09/21/22 148 lb (67.1  kg)    GEN: Well nourished, well developed in no acute distress NECK: No JVD; No carotid bruits CARDIAC: RRR, systolic murmur, heard best at the left sternal border and apex, no rubs, gallops RESPIRATORY:  Clear to auscultation without rales, wheezing or rhonchi  ABDOMEN: Soft, non-tender, non-distended EXTREMITIES:  No edema; No deformity   ASSESSMENT AND PLAN: .    Peripheral arterial disease: She is medically managed for severe stenosis of the right popliteal artery and  occluded posterior tibial artery on the right and severe stenosis of the popliteal artery extending in the TP trunk with occluded posterior tibial artery on the left.  She has no complaints of intermittent claudication symptoms but has chronic hip and leg pain.  Uncertain if this is more musculoskeletal versus PAD symptoms.  Consider starting Pletal.  She is being followed by PCP for workup concerning hips and lower back.   2.  Hypercholesterolemia: Complains of nightmares on rosuvastatin.  Will change her to atorvastatin 80 mg.  She can continue to take this today just or change to a.m. dosing she is to report if this is improved when she follows up with Dr. Kirke Corin.  Most recent labs were drawn 09/21/2022, total cholesterol 128, HDL 49, LDL 51.      3.  Hypertension: Currently well-controlled on current medication regimen.  No changes.  I do not think amlodipine is causing nightmares that she has been on this for years and has been taking it at night.  Will follow.   Signed, Bettey Mare. Liborio Nixon, ANP, AACC

## 2023-02-04 ENCOUNTER — Other Ambulatory Visit: Payer: Self-pay | Admitting: Internal Medicine

## 2023-02-04 ENCOUNTER — Encounter: Payer: Self-pay | Admitting: Adult Health

## 2023-02-04 ENCOUNTER — Ambulatory Visit: Payer: PPO | Attending: Adult Health | Admitting: Adult Health

## 2023-02-04 VITALS — BP 128/78 | HR 69 | Ht 64.0 in | Wt 149.4 lb

## 2023-02-04 DIAGNOSIS — I10 Essential (primary) hypertension: Secondary | ICD-10-CM | POA: Diagnosis not present

## 2023-02-04 DIAGNOSIS — I739 Peripheral vascular disease, unspecified: Secondary | ICD-10-CM

## 2023-02-04 DIAGNOSIS — E785 Hyperlipidemia, unspecified: Secondary | ICD-10-CM | POA: Diagnosis not present

## 2023-02-04 MED ORDER — ATORVASTATIN CALCIUM 80 MG PO TABS: 80.0000 mg | ORAL_TABLET | Freq: Every day | ORAL | 3 refills | Status: DC

## 2023-02-04 NOTE — Patient Instructions (Signed)
Medication Instructions:  Stop Crestor. Start Lipitor 80 mg ( Take 1 Tablet Daily). *If you need a refill on your cardiac medications before your next appointment, please call your pharmacy*   Lab Work: No labs If you have labs (blood work) drawn today and your tests are completely normal, you will receive your results only by: MyChart Message (if you have MyChart) OR A paper copy in the mail If you have any lab test that is abnormal or we need to change your treatment, we will call you to review the results.   Testing/Procedures: No Testing   Follow-Up: At [facility], you and your health needs are our priority.  As part of our continuing mission to provide you with exceptional heart care, we have created designated Provider Care Teams.  These Care Teams include your primary Cardiologist (physician) and Advanced Practice Providers (APPs -  Physician Assistants and Nurse Practitioners) who all work together to provide you with the care you need, when you need it.  We recommend signing up for the patient portal called "MyChart".  Sign up information is provided on this After Visit Summary.  MyChart is used to connect with patients for Virtual Visits (Telemedicine).  Patients are able to view lab/test results, encounter notes, upcoming appointments, etc.  Non-urgent messages can be sent to your provider as well.   To learn more about what you can do with MyChart, go to [email or web address].    Your next appointment:   6 month(s)  Provider:   Lorine Bears, MD

## 2023-02-21 ENCOUNTER — Encounter: Payer: Self-pay | Admitting: Internal Medicine

## 2023-02-21 NOTE — Patient Instructions (Addendum)
      Blood work was ordered.   The lab is on the first floor.    Medications changes include :   none    A referral was ordered cardiology for follow up and someone will call you to schedule an appointment.     Return in about 6 months (around 08/22/2023) for Physical Exam.

## 2023-02-21 NOTE — Progress Notes (Signed)
Subjective:    Patient ID: Kayla Craig, female    DOB: 1935/04/10, 87 y.o.   MRN: [id number]     HPI Kayla Craig is here for follow up of her chronic medical problems.   Cardiology changed Crestor to atorvastatin due to nightmares with Crestor.  She fainted last Wednesday  around 6 pm - she was making dinner.  She felt got dizzy and passed out - as soon as she hit the floor she came too.  She had another episode like this in the summer.  She had eaten and drank normally that day.  She denies feeling lightheaded, nauseous or sweating before this happened.  She does not monitor her BP at home.    Medications and allergies reviewed with patient and updated if appropriate.  Current Outpatient Medications on File Prior to Visit  Medication Sig Dispense Refill   acetaminophen (TYLENOL) 500 MG tablet Take 500 mg by mouth every 6 (six) hours as needed.     amLODipine (NORVASC) 10 MG tablet Take 1 tablet by mouth once daily 90 tablet 0   aspirin 81 MG EC tablet Take 81 mg by mouth daily.     Calcium Carbonate-Vit D-Min (CALCIUM 1200 PO) Take by mouth.     carvedilol (COREG) 6.25 MG tablet Take 1 tablet by mouth twice daily 180 tablet 3   cholecalciferol (VITAMIN D3) 25 MCG (1000 UNIT) tablet Take 1,000 Units by mouth daily.     famotidine (PEPCID) 20 MG tablet Take 1 tablet (20 mg total) by mouth 2 (two) times daily as needed. 60 tablet 5   levothyroxine (SYNTHROID) 50 MCG tablet TAKE 1 TABLET BY MOUTH ONCE DAILY BEFORE BREAKFAST 90 tablet 3   losartan (COZAAR) 100 MG tablet Take 1 tablet by mouth once daily 90 tablet 0   metFORMIN (GLUCOPHAGE-XR) 500 MG 24 hr tablet Take 1 tablet by mouth once daily with breakfast 90 tablet 0   vitamin B-12 (CYANOCOBALAMIN) 1000 MCG tablet Take 1,000 mcg by mouth daily.     albuterol (VENTOLIN HFA) 108 (90 Base) MCG/ACT inhaler Inhale 2 puffs into the lungs every 6 (six) hours as needed (cough or shortness of breath). (Patient not taking: Reported on  02/04/2023) 8 g 1   atorvastatin (LIPITOR) 80 MG tablet Take 1 tablet (80 mg total) by mouth daily. (Patient not taking: Reported on 02/22/2023) 90 tablet 3   Baclofen 5 MG TABS Take 1 tablet (5 mg total) by mouth 2 (two) times daily as needed (back pain). (Patient not taking: Reported on 02/04/2023) 60 tablet 0   No current facility-administered medications on file prior to visit.     Review of Systems  Constitutional:  Negative for fever.  Respiratory:  Positive for cough. Negative for chest tightness, shortness of breath and wheezing.   Cardiovascular:  Negative for chest pain, palpitations and leg swelling.  Musculoskeletal:  Positive for back pain.  Neurological:  Positive for headaches (rare). Negative for light-headedness.       Objective:   Vitals:   02/22/23 1400  BP: 132/68  Pulse: 68  Temp: 98 F (36.7 C)  SpO2: 97%   BP Readings from Last 3 Encounters:  02/22/23 132/68  02/04/23 128/78  11/23/22 130/68   Wt Readings from Last 3 Encounters:  02/22/23 148 lb (67.1 kg)  02/04/23 149 lb 6.4 oz (67.8 kg)  11/23/22 149 lb (67.6 kg)   Body mass index is 25.4 kg/m.    Physical Exam Constitutional:  General: She is not in acute distress.    Appearance: Normal appearance.  HENT:     Head: Normocephalic and atraumatic.  Eyes:     Conjunctiva/sclera: Conjunctivae normal.  Cardiovascular:     Rate and Rhythm: Normal rate and regular rhythm.     Heart sounds: Normal heart sounds.  Pulmonary:     Effort: Pulmonary effort is normal. No respiratory distress.     Breath sounds: Normal breath sounds. No wheezing.  Musculoskeletal:     Cervical back: Neck supple.     Right lower leg: No edema.     Left lower leg: No edema.  Lymphadenopathy:     Cervical: No cervical adenopathy.  Skin:    General: Skin is warm and dry.     Findings: No rash.  Neurological:     Mental Status: She is alert. Mental status is at baseline.  Psychiatric:        Mood and Affect:  Mood normal.        Behavior: Behavior normal.        Lab Results  Component Value Date   WBC 8.7 11/19/2022   HGB 12.5 11/19/2022   HCT 39.3 11/19/2022   PLT 259 11/19/2022   GLUCOSE 113 (H) 11/19/2022   CHOL 128 09/21/2022   TRIG 306.0 (H) 09/21/2022   HDL 49.60 09/21/2022   LDLDIRECT 51.0 09/21/2022   LDLCALC 69 06/20/2021   ALT 10 09/21/2022   AST 13 09/21/2022   NA 135 11/19/2022   K 3.8 11/19/2022   CL 103 11/19/2022   CREATININE 0.90 11/19/2022   BUN 20 11/19/2022   CO2 23 11/19/2022   TSH 2.07 09/21/2022   HGBA1C 6.0 09/21/2022   MICROALBUR 2.6 (H) 03/06/2022     Assessment & Plan:    See Problem List for Assessment and Plan of chronic medical problems.

## 2023-02-22 ENCOUNTER — Ambulatory Visit (INDEPENDENT_AMBULATORY_CARE_PROVIDER_SITE_OTHER): Payer: PPO | Admitting: Internal Medicine

## 2023-02-22 VITALS — BP 132/68 | HR 68 | Temp 98.0°F | Ht 64.0 in | Wt 148.0 lb

## 2023-02-22 DIAGNOSIS — R55 Syncope and collapse: Secondary | ICD-10-CM

## 2023-02-22 DIAGNOSIS — I10 Essential (primary) hypertension: Secondary | ICD-10-CM

## 2023-02-22 DIAGNOSIS — K219 Gastro-esophageal reflux disease without esophagitis: Secondary | ICD-10-CM

## 2023-02-22 DIAGNOSIS — Z7984 Long term (current) use of oral hypoglycemic drugs: Secondary | ICD-10-CM | POA: Diagnosis not present

## 2023-02-22 DIAGNOSIS — E785 Hyperlipidemia, unspecified: Secondary | ICD-10-CM | POA: Diagnosis not present

## 2023-02-22 DIAGNOSIS — E538 Deficiency of other specified B group vitamins: Secondary | ICD-10-CM

## 2023-02-22 DIAGNOSIS — E039 Hypothyroidism, unspecified: Secondary | ICD-10-CM

## 2023-02-22 DIAGNOSIS — E1169 Type 2 diabetes mellitus with other specified complication: Secondary | ICD-10-CM | POA: Diagnosis not present

## 2023-02-22 DIAGNOSIS — E559 Vitamin D deficiency, unspecified: Secondary | ICD-10-CM

## 2023-02-22 DIAGNOSIS — E118 Type 2 diabetes mellitus with unspecified complications: Secondary | ICD-10-CM | POA: Diagnosis not present

## 2023-02-22 DIAGNOSIS — M545 Low back pain, unspecified: Secondary | ICD-10-CM

## 2023-02-22 LAB — COMPREHENSIVE METABOLIC PANEL
ALT: 12 U/L (ref 0–35)
AST: 17 U/L (ref 0–37)
Albumin: 4.4 g/dL (ref 3.5–5.2)
Alkaline Phosphatase: 49 U/L (ref 39–117)
BUN: 23 mg/dL (ref 6–23)
CO2: 28 meq/L (ref 19–32)
Calcium: 9.8 mg/dL (ref 8.4–10.5)
Chloride: 104 meq/L (ref 96–112)
Creatinine, Ser: 1.01 mg/dL (ref 0.40–1.20)
GFR: 50.82 mL/min — ABNORMAL LOW (ref >60.00)
Glucose, Bld: 94 mg/dL (ref 70–99)
Potassium: 4.1 meq/L (ref 3.5–5.1)
Sodium: 139 meq/L (ref 135–145)
Total Bilirubin: 0.5 mg/dL (ref 0.2–1.2)
Total Protein: 7.9 g/dL (ref 6.0–8.3)

## 2023-02-22 LAB — CBC WITH DIFFERENTIAL/PLATELET
Basophils Absolute: 0 10*3/uL (ref 0.0–0.1)
Basophils Relative: 0.3 % (ref 0.0–3.0)
Eosinophils Absolute: 0.2 10*3/uL (ref 0.0–0.7)
Eosinophils Relative: 2.6 % (ref 0.0–5.0)
HCT: 40.3 % (ref 36.0–46.0)
Hemoglobin: 13.1 g/dL (ref 12.0–15.0)
Lymphocytes Relative: 27.8 % (ref 12.0–46.0)
Lymphs Abs: 2.4 10*3/uL (ref 0.7–4.0)
MCHC: 32.6 g/dL (ref 30.0–36.0)
MCV: 87 fL (ref 78.0–100.0)
Monocytes Absolute: 0.8 10*3/uL (ref 0.1–1.0)
Monocytes Relative: 9.9 % (ref 3.0–12.0)
Neutro Abs: 5 10*3/uL (ref 1.4–7.7)
Neutrophils Relative %: 59.4 % (ref 43.0–77.0)
Platelets: 261 10*3/uL (ref 150.0–400.0)
RBC: 4.63 Mil/uL (ref 3.87–5.11)
RDW: 13.8 % (ref 11.5–15.5)
WBC: 8.5 10*3/uL (ref 4.0–10.5)

## 2023-02-22 LAB — LDL CHOLESTEROL, DIRECT: Direct LDL: 88 mg/dL

## 2023-02-22 LAB — VITAMIN D 25 HYDROXY (VIT D DEFICIENCY, FRACTURES): VITD: 49.15 ng/mL (ref 30.00–100.00)

## 2023-02-22 LAB — LIPID PANEL
Cholesterol: 179 mg/dL (ref 0–200)
HDL: 48.5 mg/dL (ref >39.00)
Total CHOL/HDL Ratio: 4
Triglycerides: 426 mg/dL — ABNORMAL HIGH (ref 0.0–149.0)

## 2023-02-22 LAB — TSH: TSH: 1.9 u[IU]/mL (ref 0.35–5.50)

## 2023-02-22 LAB — HEMOGLOBIN A1C: Hgb A1c MFr Bld: 6.2 % (ref 4.6–6.5)

## 2023-02-22 LAB — MICROALBUMIN / CREATININE URINE RATIO
Creatinine,U: 182.6 mg/dL
Microalb Creat Ratio: 1.7 mg/g (ref 0.0–30.0)
Microalb, Ur: 3.1 mg/dL — ABNORMAL HIGH (ref 0.0–1.9)

## 2023-02-22 LAB — VITAMIN B12: Vitamin B-12: 766 pg/mL (ref 211–911)

## 2023-02-22 NOTE — Assessment & Plan Note (Addendum)
Chronic Regular exercise and healthy diet encouraged Check lipid panel, CMP Currently not taking anything Had nightmares with Crestor 20 mg daily and was switched to atorvastatin 80 mg daily by cardiology-she is concerned about the dose and wonders if that caused the syncopal episode-discussed that she had syncope over the summer and she was not taking that so is unlikely related to the atorvastatin She will restart the atorvastatin 40 mg daily and then increase to 80 mg daily when she feels comfortable

## 2023-02-22 NOTE — Assessment & Plan Note (Addendum)
Chronic   Lab Results  Component Value Date   HGBA1C 6.0 09/21/2022   Sugars well controlled Check A1c, urine microalbumin Continue metformin XR 500 mg daily with breakfast Stressed diabetic diet

## 2023-02-22 NOTE — Assessment & Plan Note (Signed)
Chronic Taking vitamin D daily Check vitamin D level  

## 2023-02-22 NOTE — Assessment & Plan Note (Signed)
Chronic  Clinically euthyroid Check tsh and will titrate med dose if needed Currently taking levothyroxine 50 mcg daily 

## 2023-02-22 NOTE — Assessment & Plan Note (Addendum)
Chronic Ok with sleeping and sitting - has pain with walking She plans on seeing a back specialist but needs to finish with her dental work Concern for spinal stenosis

## 2023-02-22 NOTE — Assessment & Plan Note (Signed)
Acute 2 nights ago she had syncope She was in the kitchen cooking dinner and felt suddenly dizzy-no other symptoms at that time She was able to turn off the stove, move the pan and fell to the floor.  She states positive LOC.  As soon as she hit the floor she did come to States she had a similar episode over the summer, but at that time does not recall feeling dizzy first Denies any chest pain, palpitations, frequent lightheadedness No loss of urine, tongue biting ?  Arrhythmia Advised her to see cardiology for further evaluation-?  Need long-term Holter monitor Referral ordered to help facilitate the follow-up

## 2023-02-22 NOTE — Assessment & Plan Note (Signed)
Chronic Blood pressure is well-controlled CBC, CMP Continue amlodipine 10 mg daily, Coreg 6.25 mg twice daily, losartan 100 mg daily

## 2023-02-22 NOTE — Assessment & Plan Note (Signed)
Chronic ?Check B12 level ?

## 2023-02-22 NOTE — Assessment & Plan Note (Addendum)
Chronic GERD  controlled  Continue famotidine 20 mg daily prn

## 2023-03-19 ENCOUNTER — Ambulatory Visit (INDEPENDENT_AMBULATORY_CARE_PROVIDER_SITE_OTHER): Payer: PPO

## 2023-03-19 ENCOUNTER — Ambulatory Visit: Payer: PPO | Attending: Cardiovascular Disease | Admitting: Cardiovascular Disease

## 2023-03-19 ENCOUNTER — Encounter: Payer: Self-pay | Admitting: Cardiovascular Disease

## 2023-03-19 VITALS — BP 130/78 | HR 67 | Ht 64.0 in | Wt 146.8 lb

## 2023-03-19 DIAGNOSIS — R55 Syncope and collapse: Secondary | ICD-10-CM | POA: Diagnosis not present

## 2023-03-19 DIAGNOSIS — I739 Peripheral vascular disease, unspecified: Secondary | ICD-10-CM

## 2023-03-19 DIAGNOSIS — E785 Hyperlipidemia, unspecified: Secondary | ICD-10-CM

## 2023-03-19 DIAGNOSIS — J8410 Pulmonary fibrosis, unspecified: Secondary | ICD-10-CM

## 2023-03-19 DIAGNOSIS — I10 Essential (primary) hypertension: Secondary | ICD-10-CM

## 2023-03-19 NOTE — Progress Notes (Unsigned)
Enrolled for [employer] to mail a ZIO XT long term holter monitor to the patients address on file.   [id number] mailed to patient and applied in office 03/26/23.

## 2023-03-19 NOTE — Progress Notes (Signed)
Cardiology Office Note   Date:  03/19/2023   ID:  Kayla Craig, DOB 1935/12/18, MRN [id number]  PCP:  Pincus Sanes, MD  Cardiologist:   Lorine Bears, MD   No chief complaint on file.      History of Present Illness: Kayla Craig is a 87 y.o. female who is here today for follow-up visit regarding peripheral arterial disease. She has no prior cardiac history.  She has multiple chronic medical conditions including essential hypertension, type 2 diabetes, hyperlipidemia, GERD and osteoarthritis.  She suffers from hammertoes on both sides but with significant discomfort affecting the right second toe with some discoloration.  She was initially planning to have surgery done and was referred for vascular evaluation before that. She underwent noninvasive vascular evaluation in August 2021 which showed an ABI of 0.78 on the right and 0.65 on the left. Duplex showed severe stenosis in the right popliteal artery with occluded posterior tibial artery.  On the left, there was also severe stenosis in the popliteal artery extending into the TP trunk with occluded posterior tibial artery.  She also has pulmonary fibrosis followed by pulmonary.  She had a Lexiscan Myoview in July 2022 that showed no evidence of ischemia with normal ejection fraction.    Renal artery duplex was done which showed no evidence of renal artery stenosis.  She reports having a syncopal episode recently when she stood up and felt dizzy followed by brief loss of consciousness.  No chest pain or worsening dyspnea. She denies calf claudication but she is limited by low back and hip discomfort.  Past Medical History:  Diagnosis Date   B12 deficiency    Back pain    Chronic   Goiter    Grief    Grief and loss related depression '08   Hypertension    TMJ (dislocation of temporomandibular joint)    Right Side   Vitamin D deficiency     Past Surgical History:  Procedure Laterality Date   EYE SURGERY  2010    cataract surgery with implants   KNEE SURGERY     Right Arthroscopy '05 Dr Charlann Boxer   THYROIDECTOMY     August '09 for goiter (Dr Jearld Fenton)   TUBAL LIGATION       Current Outpatient Medications  Medication Sig Dispense Refill   acetaminophen (TYLENOL) 500 MG tablet Take 500 mg by mouth every 6 (six) hours as needed.     albuterol (VENTOLIN HFA) 108 (90 Base) MCG/ACT inhaler Inhale 2 puffs into the lungs every 6 (six) hours as needed (cough or shortness of breath). 8 g 1   amLODipine (NORVASC) 10 MG tablet Take 1 tablet by mouth once daily 90 tablet 0   aspirin 81 MG EC tablet Take 81 mg by mouth daily.     Baclofen 5 MG TABS Take 1 tablet (5 mg total) by mouth 2 (two) times daily as needed (back pain). 60 tablet 0   Calcium Carbonate-Vit D-Min (CALCIUM 1200 PO) Take by mouth.     carvedilol (COREG) 6.25 MG tablet Take 1 tablet by mouth twice daily 180 tablet 3   cholecalciferol (VITAMIN D3) 25 MCG (1000 UNIT) tablet Take 1,000 Units by mouth daily.     famotidine (PEPCID) 20 MG tablet Take 1 tablet (20 mg total) by mouth 2 (two) times daily as needed. 60 tablet 5   levothyroxine (SYNTHROID) 50 MCG tablet TAKE 1 TABLET BY MOUTH ONCE DAILY BEFORE BREAKFAST 90 tablet 3  losartan (COZAAR) 100 MG tablet Take 1 tablet by mouth once daily 90 tablet 0   metFORMIN (GLUCOPHAGE-XR) 500 MG 24 hr tablet Take 1 tablet by mouth once daily with breakfast 90 tablet 0   vitamin B-12 (CYANOCOBALAMIN) 1000 MCG tablet Take 1,000 mcg by mouth daily.     atorvastatin (LIPITOR) 80 MG tablet Take 1 tablet (80 mg total) by mouth daily. (Patient not taking: Reported on 03/19/2023) 90 tablet 3   No current facility-administered medications for this visit.    Allergies:   Codeine and Nexletol [bempedoic acid]    Social History:  The patient  reports that she has never smoked. She has been exposed to tobacco smoke. She has never used smokeless tobacco. She reports that she does not drink alcohol and does not use  drugs.   Family History:  The patient's family history includes Cancer in her brother and mother; Heart disease in her brother, brother, and father.    ROS:  Please see the history of present illness.   Otherwise, review of systems are positive for none.   All other systems are reviewed and negative.    PHYSICAL EXAM: VS:  BP 130/78 (BP Location: Left Arm, Patient Position: Sitting, Cuff Size: Normal)   Pulse 67   Ht 5\' 4"  (1.626 m)   Wt 146 lb 12.8 oz (66.6 kg)   SpO2 96%   BMI 25.20 kg/m  , BMI Body mass index is 25.2 kg/m. GEN: Well nourished, well developed, in no acute distress  HEENT: normal  Neck: no JVD, carotid bruits, or masses Cardiac: RRR; no murmurs, rubs, or gallops,no edema  Respiratory:  clear to auscultation bilaterally, normal work of breathing GI: soft, nontender, nondistended, + BS MS: no deformity or atrophy  Skin: warm and dry, no rash Neuro:  Strength and sensation are intact Psych: euthymic mood, full affect Vascular: Femoral pulses normal bilaterally.  Distal pulses are not palpable.    EKG:  EKG is  ordered today. EKG showed: Sinus rhythm with 1st degree A-V block Moderate voltage criteria for LVH, may be normal variant ( R in aVL , Cornell product ) When compared with ECG of 04-Feb-2023 09:22, No significant change was found    Recent Labs: 02/22/2023: ALT 12; BUN 23; Creatinine, Ser 1.01; Hemoglobin 13.1; Platelets 261.0; Potassium 4.1; Sodium 139; TSH 1.90    Lipid Panel    Component Value Date/Time   CHOL 179 02/22/2023 1446   CHOL 157 06/20/2021 1100   TRIG (H) 02/22/2023 1446    426.0 Triglyceride is over 400; calculations on Lipids are invalid.   HDL 48.50 02/22/2023 1446   HDL 50 06/20/2021 1100   CHOLHDL 4 02/22/2023 1446   VLDL 61.2 (H) 09/21/2022 1529   LDLCALC 69 06/20/2021 1100   LDLDIRECT 88.0 02/22/2023 1446      Wt Readings from Last 3 Encounters:  03/19/23 146 lb 12.8 oz (66.6 kg)  02/22/23 148 lb (67.1 kg)   02/04/23 149 lb 6.4 oz (67.8 kg)          No data to display            ASSESSMENT AND PLAN:  1.  Peripheral arterial disease: The patient has evidence of severe popliteal artery stenosis bilaterally slightly worse on the left side given the extension into the TP trunk.  She continues to be asymptomatic.  Continue medical therapy. Her low back and hip discomfort is not due to peripheral arterial disease.  Her femoral pulses are normal  bilaterally.  2.  Refractory hypertension: Blood pressure is not well-controlled on current medications.  Renal artery duplex showed no evidence of renal artery stenosis.  3.  Hyperlipidemia: She was switched from rosuvastatin to atorvastatin but went back to rosuvastatin as she felt atorvastatin might have caused her syncopal episode.  4.  Pulmonary fibrosis: Followed by pulmonary.  5.  Syncope: Likely due to orthostatic hypotension but we have to exclude arrhythmia.  I requested a 2-week ZIO monitor. Echocardiogram in 2023 showed normal LV systolic function.     Disposition:   FU with me in 6 months  Signed,  Lorine Bears, MD  03/19/2023 10:20 AM    [facility]

## 2023-03-19 NOTE — Patient Instructions (Signed)
Medication Instructions:  No changes *If you need a refill on your cardiac medications before your next appointment, please call your pharmacy*   Lab Work: None ordered If you have labs (blood work) drawn today and your tests are completely normal, you will receive your results only by: MyChart Message (if you have MyChart) OR A paper copy in the mail If you have any lab test that is abnormal or we need to change your treatment, we will call you to review the results.   Testing/Procedures: None ordered   Follow-Up: At [facility], you and your health needs are our priority.  As part of our continuing mission to provide you with exceptional heart care, we have created designated Provider Care Teams.  These Care Teams include your primary Cardiologist (physician) and Advanced Practice Providers (APPs -  Physician Assistants and Nurse Practitioners) who all work together to provide you with the care you need, when you need it.  We recommend signing up for the patient portal called "MyChart".  Sign up information is provided on this After Visit Summary.  MyChart is used to connect with patients for Virtual Visits (Telemedicine).  Patients are able to view lab/test results, encounter notes, upcoming appointments, etc.  Non-urgent messages can be sent to your provider as well.   To learn more about what you can do with MyChart, go to [email or web address].    Your next appointment:   6 month(s)  Provider:   Lorine Bears, MD     Other Instructions Christena Deem- Long Term Monitor Instructions  Your physician has requested you wear a ZIO patch monitor for 14 days.  This is a single patch monitor. [employer] supplies one patch monitor per enrollment. Additional stickers are not available. Please do not apply patch if you will be having a Nuclear Stress Test,  Echocardiogram, Cardiac CT, MRI, or Chest Xray during the period you would be wearing the  monitor. The patch cannot be worn  during these tests. You cannot remove and re-apply the  ZIO XT patch monitor.  Your ZIO patch monitor will be mailed 3 day USPS to your address on file. It may take 3-5 days  to receive your monitor after you have been enrolled.  Once you have received your monitor, please review the enclosed instructions. Your monitor  has already been registered assigning a specific monitor serial # to you.  Billing and Patient Assistance Program Information  We have supplied [employer] with any of your insurance information on file for billing purposes. [employer] offers a sliding scale Patient Assistance Program for patients that do not have  insurance, or whose insurance does not completely cover the cost of the ZIO monitor.  You must apply for the Patient Assistance Program to qualify for this discounted rate.  To apply, please call [employer] at [phone], select option 4, select option 2, ask to apply for  Patient Assistance Program. Meredeth Ide will ask your household income, and how many people  are in your household. They will quote your out-of-pocket cost based on that information.  [employer] will also be able to set up a 31-month, interest-free payment plan if needed.  Applying the monitor   Shave hair from upper left chest.  Hold abrader disc by orange tab. Rub abrader in 40 strokes over the upper left chest as  indicated in your monitor instructions.  Clean area with 4 enclosed alcohol pads. Let dry.  Apply patch as indicated in monitor instructions. Patch will be placed under collarbone on  left  side of chest with arrow pointing upward.  Rub patch adhesive wings for 2 minutes. Remove white label marked "1". Remove the white  label marked "2". Rub patch adhesive wings for 2 additional minutes.  While looking in a mirror, press and release button in center of patch. A small green light will  flash 3-4 times. This will be your only indicator that the monitor has been turned on.  Do not shower for the  first 24 hours. You may shower after the first 24 hours.  Press the button if you feel a symptom. You will hear a small click. Record Date, Time and  Symptom in the Patient Logbook.  When you are ready to remove the patch, follow instructions on the last 2 pages of Patient  Logbook. Stick patch monitor onto the last page of Patient Logbook.  Place Patient Logbook in the blue and white box. Use locking tab on box and tape box closed  securely. The blue and white box has prepaid postage on it. Please place it in the mailbox as  soon as possible. Your physician should have your test results approximately 7 days after the  monitor has been mailed back to [facility].  Call [facility] Customer Care at [phone] if you have questions regarding  your ZIO XT patch monitor. Call them immediately if you see an orange light blinking on your  monitor.  If your monitor falls off in less than 4 days, contact our Monitor department at [phone].  If your monitor becomes loose or falls off after 4 days call [employer] at [phone] for  suggestions on securing your monitor

## 2023-03-21 ENCOUNTER — Other Ambulatory Visit: Payer: Self-pay | Admitting: Internal Medicine

## 2023-03-25 ENCOUNTER — Telehealth: Payer: Self-pay | Admitting: Cardiovascular Disease

## 2023-03-25 NOTE — Telephone Encounter (Signed)
Scheduled for application.

## 2023-03-25 NOTE — Telephone Encounter (Signed)
Pt received their heart monitor and needs help putting it on. Please advise

## 2023-03-26 ENCOUNTER — Ambulatory Visit: Payer: PPO | Attending: Internal Medicine

## 2023-03-26 DIAGNOSIS — R55 Syncope and collapse: Secondary | ICD-10-CM

## 2023-04-17 DIAGNOSIS — R55 Syncope and collapse: Secondary | ICD-10-CM | POA: Diagnosis not present

## 2023-04-24 ENCOUNTER — Other Ambulatory Visit: Payer: Self-pay | Admitting: Internal Medicine

## 2023-05-01 IMAGING — CT CT CHEST HIGH RESOLUTION
2 of 7 series · 14 of 36 positions shown, 17 images · non-contrast
Comparison: Chest CT 11/10/2020.

CLINICAL DATA: 85-year-old female with history of chronic dry cough
since [REDACTED]. Evaluate for interstitial lung disease.

EXAM:
CT CHEST WITHOUT CONTRAST
TECHNIQUE: Multidetector CT imaging of the chest was performed following the
standard protocol without intravenous contrast. High resolution
imaging of the lungs, as well as inspiratory and expiratory imaging,
was performed.

[Series 4: high resolution · axial · 0.62mm/px · z∈[-270,-50]mm · 11 of 264 slices shown, 14 images]
[im 22/264  mediastinal]
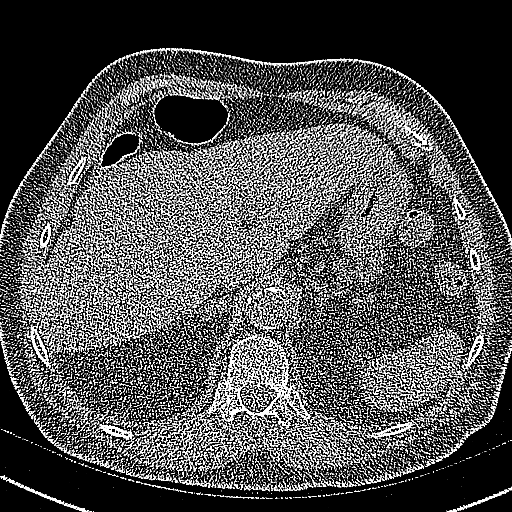
[im 22/264  lung]
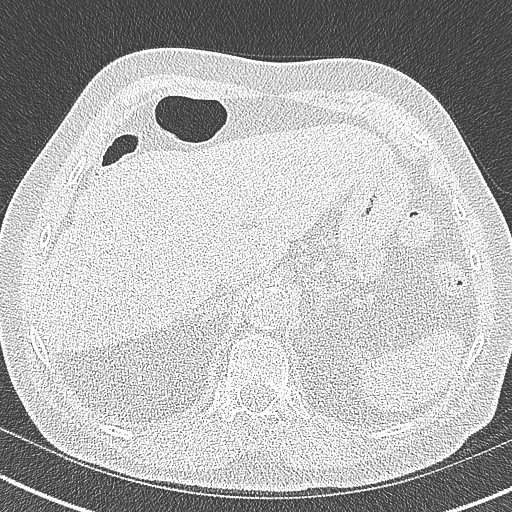
[im 44/264  lung]
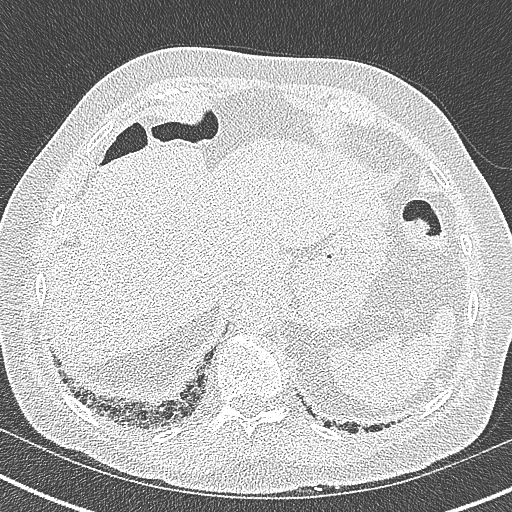
[im 66/264  lung]
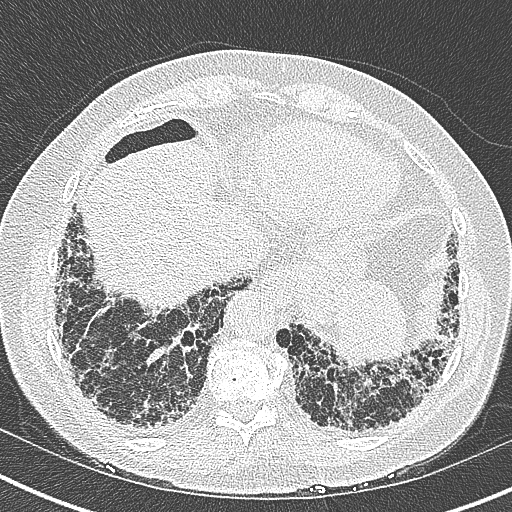
[im 88/264  lung]
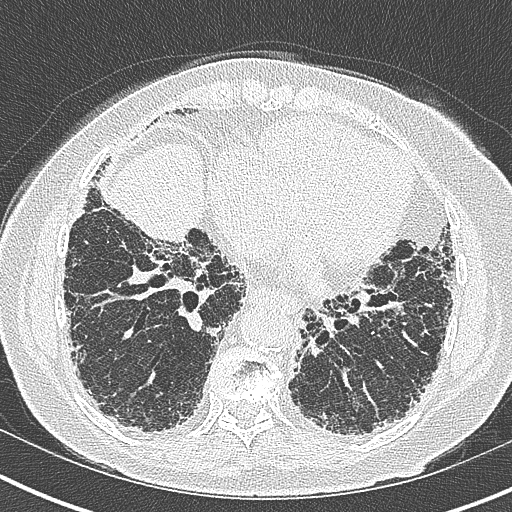
[im 110/264  mediastinal]
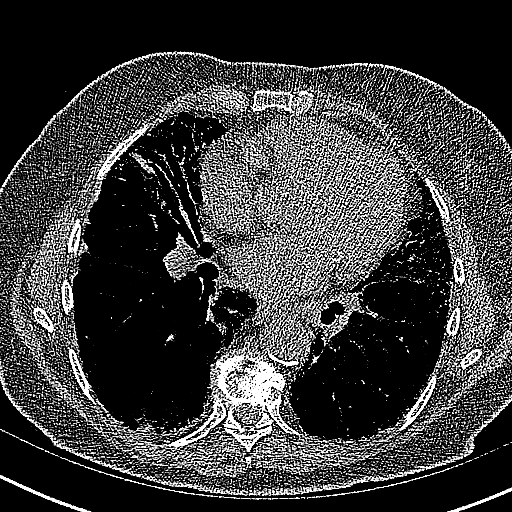
[im 110/264  lung]
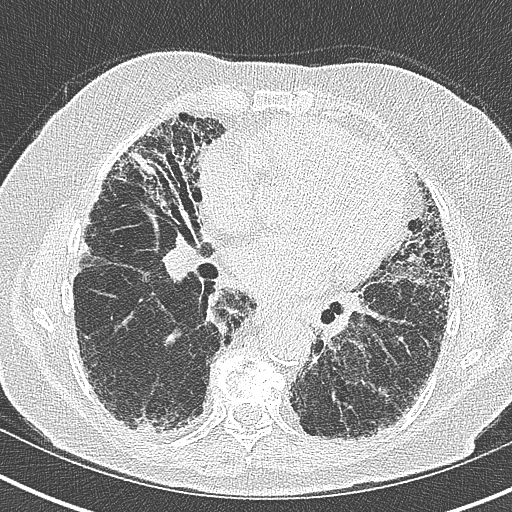
[im 132/264  lung]
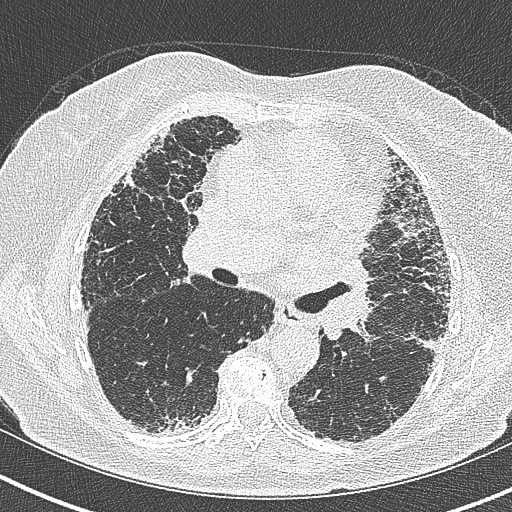
[im 154/264  lung]
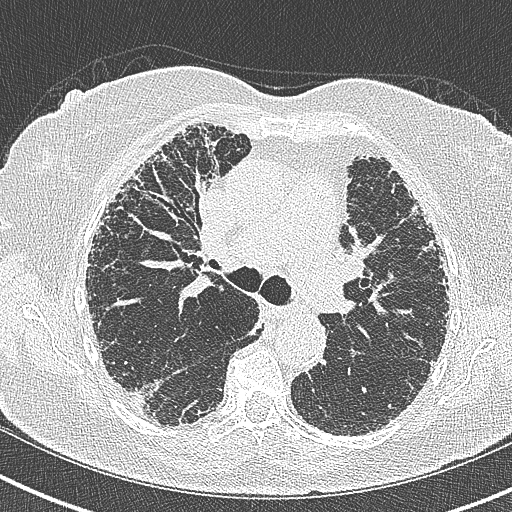
[im 176/264  lung]
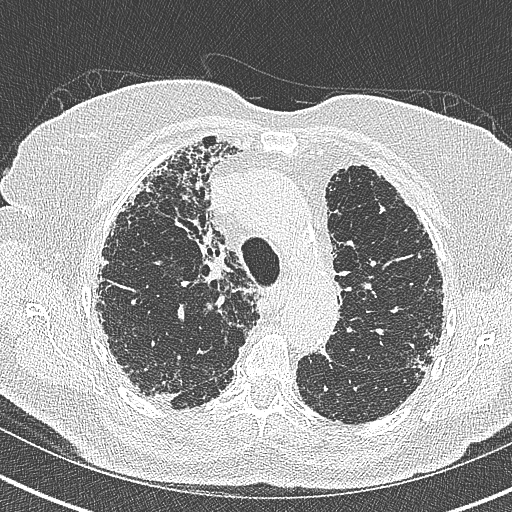
[im 198/264  mediastinal]
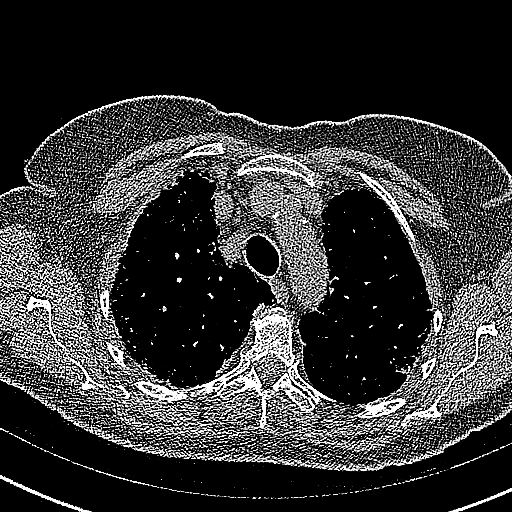
[im 198/264  lung]
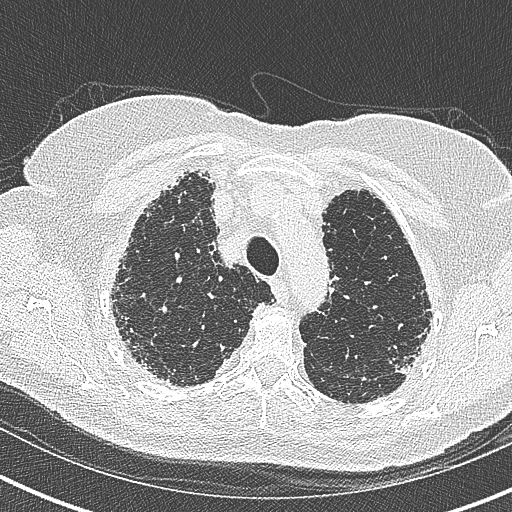
[im 220/264  lung]
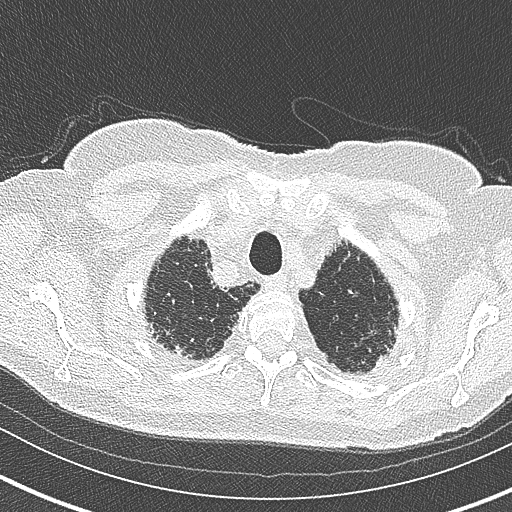
[im 242/264  lung]
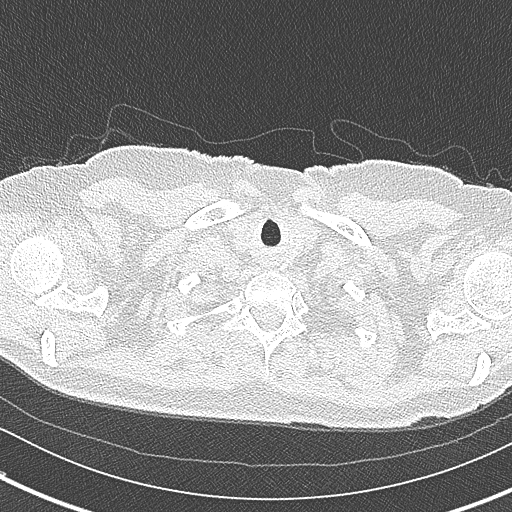

[Series 6: coronal · coronal · 0.54mm/px · 3 of 126 slices shown]
[im 26/126  lung]
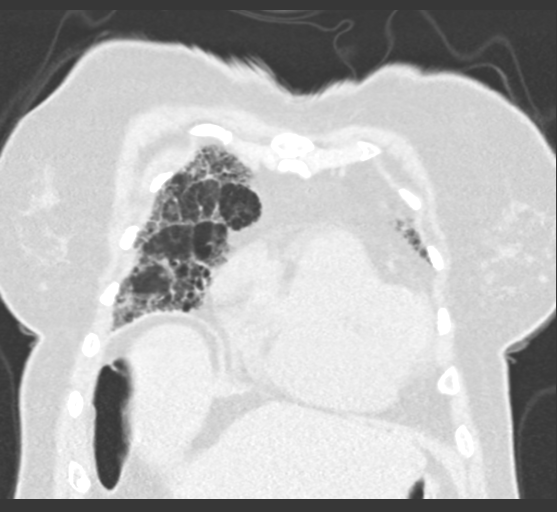
[im 51/126  lung]
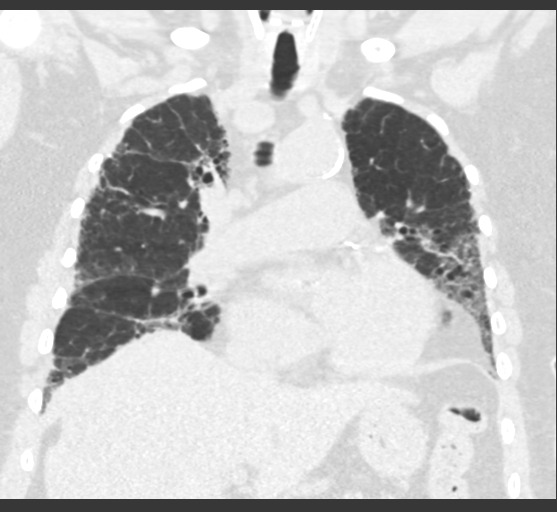
[im 76/126  lung]
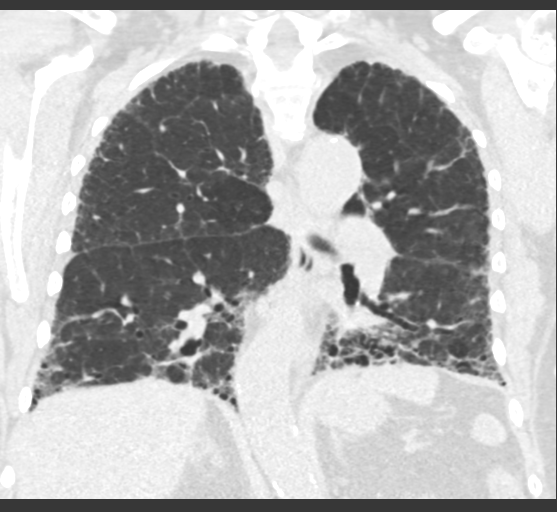

[14 of 36 positions shown; findings below may reference images not displayed]

FINDINGS: Cardiovascular: Heart size is mildly enlarged. There is no
significant pericardial fluid, thickening or pericardial
calcification. There is aortic atherosclerosis, as well as
atherosclerosis of the great vessels of the mediastinum and the
coronary arteries, including calcified atherosclerotic plaque in the
left main, left anterior descending, left circumflex and right
coronary arteries. Mild calcifications of the aortic valve. Moderate
calcifications of the mitral annulus. Dilatation of the pulmonic
trunk (3.5 cm in diameter), concerning for pulmonary arterial
hypertension.

Mediastinum/Nodes: No pathologically enlarged mediastinal or hilar
lymph nodes. Please note that accurate exclusion of hilar adenopathy
is limited on noncontrast CT scans. Esophagus is unremarkable in
appearance. No axillary lymphadenopathy.

Lungs/Pleura: High-resolution images demonstrate widespread but
patchy areas of ground-glass attenuation, septal thickening,
subpleural reticulation, cylindrical and varicose bronchiectasis,
and extensive honeycombing. Findings have a definitive craniocaudal
gradient and are considered diagnostic of usual interstitial
pneumonia. Inspiratory and expiratory imaging demonstrates minimal
air trapping indicative of very mild small airways disease. No acute
consolidative airspace disease. No pleural effusions. No definite
suspicious appearing pulmonary nodules or masses are noted.

Upper Abdomen: Aortic atherosclerosis.

Musculoskeletal: There are no aggressive appearing lytic or blastic
lesions noted in the visualized portions of the skeleton.
IMPRESSION: 1. The appearance of the lungs is compatible with interstitial lung
disease, considered diagnostic of usual interstitial pneumonia (UIP)
per current ATS guidelines.
2. Dilatation of the pulmonic trunk (3.5 cm in diameter), concerning
for associated pulmonary arterial hypertension.
3. Mild cardiomegaly.
4. Aortic atherosclerosis, in addition to left main and 3 vessel
coronary artery disease.
5. There are calcifications of the aortic valve and mitral annulus.
Echocardiographic correlation for evaluation of potential valvular
dysfunction may be warranted if clinically indicated.

Aortic Atherosclerosis ([id number]-XWV.V).

## 2023-05-24 ENCOUNTER — Other Ambulatory Visit: Payer: Self-pay | Admitting: Internal Medicine

## 2023-06-06 ENCOUNTER — Telehealth: Payer: Self-pay | Admitting: Internal Medicine

## 2023-06-06 NOTE — Telephone Encounter (Signed)
 Good afternoon!  Patient requesting referral to chiropractor due to chronic back issues. Looks like it was brought up during the visit of 02/22/23 but at that point patient was wanting to wait until they had finished with dental work. Please advise

## 2023-06-06 NOTE — Telephone Encounter (Signed)
 Copied from CRM [id number]. Topic: Referral - Request for Referral >> Jun 06, 2023 11:23 AM Adele Barthel wrote: Did the patient discuss referral with their provider in the last year? Yes (If No - schedule appointment) (If Yes - send message)  Appointment offered? No  Type of order/referral and detailed reason for visit: Chiropractor for hip and back pain  Preference of office, provider, location: [facility], [address] [place], [place], [place] [address]. # [phone]  If referral order, have you been seen by this specialty before? No (If Yes, this issue or another issue? When? Where?  Can we respond through MyChart? Yes

## 2023-06-06 NOTE — Telephone Encounter (Signed)
 No referral needed for a chiropractor - she can call and make her own appt.

## 2023-06-10 NOTE — Telephone Encounter (Signed)
 Needs to be seen - I do recall we discuss this - can get xrays and referral done at time of appt.

## 2023-06-10 NOTE — Telephone Encounter (Signed)
 Called and spoke with patient. Seemed like there was a bit of misunderstanding. Patient is just requiring some assistance with back and hip pain. She was also requesting some x-rays. Scheduled her for a follow up with [place] but informed her we could possibly escalate this to ortho or sports med.

## 2023-06-11 NOTE — Telephone Encounter (Signed)
Called and informed pt, pt verbalized understanding.

## 2023-06-20 ENCOUNTER — Encounter: Payer: Self-pay | Admitting: Internal Medicine

## 2023-06-20 ENCOUNTER — Ambulatory Visit (INDEPENDENT_AMBULATORY_CARE_PROVIDER_SITE_OTHER): Admitting: Internal Medicine

## 2023-06-20 ENCOUNTER — Ambulatory Visit (INDEPENDENT_AMBULATORY_CARE_PROVIDER_SITE_OTHER)

## 2023-06-20 VITALS — BP 112/60 | HR 79 | Temp 98.3°F | Ht 64.0 in | Wt 150.4 lb

## 2023-06-20 DIAGNOSIS — G8929 Other chronic pain: Secondary | ICD-10-CM

## 2023-06-20 DIAGNOSIS — M1611 Unilateral primary osteoarthritis, right hip: Secondary | ICD-10-CM | POA: Diagnosis not present

## 2023-06-20 DIAGNOSIS — M545 Low back pain, unspecified: Secondary | ICD-10-CM | POA: Diagnosis not present

## 2023-06-20 DIAGNOSIS — I10 Essential (primary) hypertension: Secondary | ICD-10-CM

## 2023-06-20 DIAGNOSIS — M2578 Osteophyte, vertebrae: Secondary | ICD-10-CM | POA: Diagnosis not present

## 2023-06-20 DIAGNOSIS — M47816 Spondylosis without myelopathy or radiculopathy, lumbar region: Secondary | ICD-10-CM | POA: Diagnosis not present

## 2023-06-20 DIAGNOSIS — M419 Scoliosis, unspecified: Secondary | ICD-10-CM | POA: Diagnosis not present

## 2023-06-20 DIAGNOSIS — M5136 Other intervertebral disc degeneration, lumbar region with discogenic back pain only: Secondary | ICD-10-CM | POA: Diagnosis not present

## 2023-06-20 DIAGNOSIS — M4317 Spondylolisthesis, lumbosacral region: Secondary | ICD-10-CM | POA: Diagnosis not present

## 2023-06-20 NOTE — Assessment & Plan Note (Signed)
 Chronic Has gotten worse Has bilateral lower back pain in the regions of her SI joints No radiculopathy Pain occurs when changing positions, rolling over in bed, walking, but no real pain at rest X-ray lumbar spine, SI joints today Referral to sports medicine Continue taking Tylenol for pain

## 2023-06-20 NOTE — Progress Notes (Signed)
 Subjective:    Patient ID: Kayla Craig, female    DOB: [birth year], 88 y.o.   MRN: [id number]      HPI Kayla Craig is here for  Chief Complaint  Patient presents with   Hip Pain   Back Pain    History of low back pain and intermittent hip pain may be 1 hip or both hips.?  If pain related to back.  Deferred seeing a specialist for this and fall.  Taking Tylenol as needed.  She has pain when she gets up and walks or rolls over in bed.  At rest she has no pain.      Right " posterior hip" hurts more than left hip.   No leg pain.  No n/t except for toes at night.  No leg weakness.    Tylenol   Medications and allergies reviewed with patient and updated if appropriate.  Current Outpatient Medications on File Prior to Visit  Medication Sig Dispense Refill   acetaminophen (TYLENOL) 500 MG tablet Take 500 mg by mouth every 6 (six) hours as needed.     albuterol (VENTOLIN HFA) 108 (90 Base) MCG/ACT inhaler Inhale 2 puffs into the lungs every 6 (six) hours as needed (cough or shortness of breath). 8 g 1   amLODipine (NORVASC) 10 MG tablet Take 1 tablet by mouth once daily 90 tablet 0   aspirin 81 MG EC tablet Take 81 mg by mouth daily.     Baclofen 5 MG TABS Take 1 tablet (5 mg total) by mouth 2 (two) times daily as needed (back pain). 60 tablet 0   Calcium Carbonate-Vit D-Min (CALCIUM 1200 PO) Take by mouth.     carvedilol (COREG) 6.25 MG tablet Take 1 tablet by mouth twice daily 180 tablet 3   cholecalciferol (VITAMIN D3) 25 MCG (1000 UNIT) tablet Take 1,000 Units by mouth daily.     famotidine (PEPCID) 20 MG tablet Take 1 tablet (20 mg total) by mouth 2 (two) times daily as needed. 60 tablet 5   levothyroxine (SYNTHROID) 50 MCG tablet TAKE 1 TABLET BY MOUTH ONCE DAILY BEFORE BREAKFAST 90 tablet 3   losartan (COZAAR) 100 MG tablet Take 1 tablet by mouth once daily 30 tablet 0   metFORMIN (GLUCOPHAGE-XR) 500 MG 24 hr tablet Take 1 tablet by mouth once daily with breakfast 90 tablet 0    vitamin B-12 (CYANOCOBALAMIN) 1000 MCG tablet Take 1,000 mcg by mouth daily.     atorvastatin (LIPITOR) 80 MG tablet Take 1 tablet (80 mg total) by mouth daily. (Patient not taking: Reported on 03/19/2023) 90 tablet 3   No current facility-administered medications on file prior to visit.    Review of Systems     Objective:   Vitals:   06/20/23 0953  BP: 112/60  Pulse: 79  Temp: 98.3 F (36.8 C)  SpO2: 91%   BP Readings from Last 3 Encounters:  06/20/23 112/60  03/19/23 130/78  02/22/23 132/68   Wt Readings from Last 3 Encounters:  06/20/23 150 lb 6.4 oz (68.2 kg)  03/19/23 146 lb 12.8 oz (66.6 kg)  02/22/23 148 lb (67.1 kg)   Body mass index is 25.82 kg/m.    Physical Exam Constitutional:      General: She is not in acute distress.    Appearance: Normal appearance. She is not ill-appearing.  HENT:     Head: Normocephalic and atraumatic.  Musculoskeletal:     Right lower leg: No edema.  Left lower leg: No edema.     Comments: No tenderness with palpation along lumbar spine.  Tenderness with palpation right SI joint region> left SI joint region  Skin:    General: Skin is warm and dry.     Findings: No erythema or rash.  Neurological:     Mental Status: She is alert.     Sensory: No sensory deficit.     Motor: No weakness.            Assessment & Plan:    See Problem List for Assessment and Plan of chronic medical problems.

## 2023-06-20 NOTE — Patient Instructions (Addendum)
    Make an appt with sports medicine downstairs for your b/l lower back pain w/o radiation - ? Need for OMT    Have xrays downstairs.      Medications changes include :   None    A referral was ordered sports medicine and someone will call you to schedule an appointment.     Return in about 2 months (around 08/20/2023) for follow up.

## 2023-06-20 NOTE — Assessment & Plan Note (Signed)
Chronic Blood pressure is well-controlled Continue amlodipine 10 mg daily, Coreg 6.25 mg twice daily, losartan 100 mg daily 

## 2023-06-25 ENCOUNTER — Ambulatory Visit: Admitting: Family Medicine

## 2023-07-01 ENCOUNTER — Other Ambulatory Visit: Payer: Self-pay | Admitting: Internal Medicine

## 2023-07-02 ENCOUNTER — Ambulatory Visit: Admitting: Family Medicine

## 2023-07-02 VITALS — BP 142/82 | HR 71 | Ht 64.0 in | Wt 148.0 lb

## 2023-07-02 DIAGNOSIS — M545 Low back pain, unspecified: Secondary | ICD-10-CM

## 2023-07-02 DIAGNOSIS — G8929 Other chronic pain: Secondary | ICD-10-CM | POA: Diagnosis not present

## 2023-07-02 NOTE — Patient Instructions (Addendum)
Thank you for coming in today.   I've referred you to Physical Therapy.  Let us know if you don't hear from them in one week.   Check back in 8 weeks

## 2023-07-02 NOTE — Progress Notes (Unsigned)
   Kayla Payor, PhD, LAT, ATC acting as a scribe for Kayla Graham, MD.  Kayla Craig is a 88 y.o. female who presents to [facility] at [facility] today for LBP. Pt was previously seen by Dr. Jean Rosenthal in 2022 for a 5th MT fx.  Today, pt c/o LBP x months. Pt locates pain to both sides of her low back, R>L. She also notes R knee pian. Pain is disturbing her sleep at night. She lives by herself.  Radiating pain: yes- into stomach LE numbness/tingling: no LE weakness: yes Aggravates: prolonged standing Treatments tried: Tylenol, creams  ***Hurt is more the hip than the back.***  Dx imaging: 06/20/23 L-spine & SI joint XR  Pertinent review of systems: ***  Relevant historical information: ***   Exam:  There were no vitals taken for this visit. General: Well Developed, well nourished, and in no acute distress.   MSK: ***    Lab and Radiology Results No results found for this or any previous visit (from the past 72 hours). No results found.     Assessment and Plan: 88 y.o. female with ***   PDMP not reviewed this encounter. No orders of the defined types were placed in this encounter.  No orders of the defined types were placed in this encounter.    Discussed warning signs or symptoms. Please see discharge instructions. Patient expresses understanding.   ***

## 2023-07-16 ENCOUNTER — Ambulatory Visit: Attending: Family Medicine

## 2023-07-16 DIAGNOSIS — M6281 Muscle weakness (generalized): Secondary | ICD-10-CM | POA: Insufficient documentation

## 2023-07-16 DIAGNOSIS — M5459 Other low back pain: Secondary | ICD-10-CM | POA: Insufficient documentation

## 2023-07-16 DIAGNOSIS — G8929 Other chronic pain: Secondary | ICD-10-CM | POA: Diagnosis not present

## 2023-07-16 DIAGNOSIS — M25551 Pain in right hip: Secondary | ICD-10-CM | POA: Insufficient documentation

## 2023-07-16 DIAGNOSIS — M545 Low back pain, unspecified: Secondary | ICD-10-CM | POA: Insufficient documentation

## 2023-07-16 NOTE — Therapy (Signed)
 OUTPATIENT PHYSICAL THERAPY THORACOLUMBAR EVALUATION   Patient Name: Kayla Craig MRN: [id number] DOB:03-Jun-1935, 88 y.o., female Today's Date: 07/16/2023  END OF SESSION:  PT End of Session - 07/16/23 1738     Visit Number 1    Authorization Type HTA    PT Start Time 1740    PT Stop Time 1825    PT Time Calculation (min) 45 min             Past Medical History:  Diagnosis Date   B12 deficiency    Back pain    Chronic   Goiter    Grief    Grief and loss related depression '08   Hypertension    TMJ (dislocation of temporomandibular joint)    Right Side   Vitamin D deficiency    Past Surgical History:  Procedure Laterality Date   EYE SURGERY  2010   cataract surgery with implants   KNEE SURGERY     Right Arthroscopy '05 Dr Charlann Boxer   THYROIDECTOMY     August '09 for goiter (Dr Jearld Fenton)   TUBAL LIGATION     Patient Active Problem List   Diagnosis Date Noted   Syncope 02/22/2023   Left-sided epistaxis 11/22/2022   Mid back pain on right side 06/12/2022   Rib pain on left side 05/25/2022   Fatigue 09/05/2021   Pulmonary HTN (HCC) 05/30/2021   PAD (peripheral artery disease) (HCC) 04/20/2021   Chronic low back pain 03/06/2021   Osteoporosis 03/05/2021   Positive ANA (antinuclear antibody) 01/30/2021   IPF (idiopathic pulmonary fibrosis) (HCC) 12/07/2020   Foot pain, right 12/07/2020   Chronic cough 11/01/2020   Diabetes mellitus type 2 with complications (HCC) 02/26/2020   Primary osteoarthritis involving multiple joints 02/17/2019   Lightheaded 05/14/2018   Hyperlipidemia associated with type 2 diabetes mellitus (HCC) 10/20/2009   GERD 03/25/2008   Back pain 10/03/2007   Hypothyroidism 02/05/2007   B12 deficiency 02/05/2007   Vitamin D deficiency 02/05/2007   Essential hypertension, benign 02/05/2007    PCP: Cheryll Cockayne  REFERRING PROVIDER: Clementeen Graham  REFERRING DIAG:  M54.50,G89.29 (ICD-10-CM) - Chronic right-sided low back pain without sciatica     Rationale for Evaluation and Treatment: Rehabilitation  THERAPY DIAG:  Other low back pain  Pain in right hip  Muscle weakness (generalized)  ONSET DATE: 07/02/23-- "pain in back for several months"   SUBJECTIVE:                                                                                                                                                                                           SUBJECTIVE STATEMENT: Everything  on my R side is bad, my knee, I have a callus on my foot and my hip has been hurting for months now. It will start hurting in my hips and go across my back.   PERTINENT HISTORY:  Peripheral arterial disease and pulmonary hypertension. Pulmonary fibrosis. Diabetes. Hyperlipidemia.   PAIN:  Are you having pain? Yes: NPRS scale: 10/10 when standing and walking Pain location: R hip, low back Pain description: stabbing pain, sharp  Aggravating factors: standing and walking more than 10-63mins  Relieving factors: sitting down, Tylenol, use some roll on pain stuff that might help   PRECAUTIONS: None  RED FLAGS: None   WEIGHT BEARING RESTRICTIONS: No  FALLS:  Has patient fallen in last 6 months? No  LIVING ENVIRONMENT: Lives with: lives alone Lives in: House/apartment Stairs: No  OCCUPATION: Retired  PLOF: Independent  PATIENT GOALS: to not be in pain, to walk and stand for longer than 10-69mins   NEXT MD VISIT: 08/27/23  OBJECTIVE:  Note: Objective measures were completed at Evaluation unless otherwise noted.  DIAGNOSTIC FINDINGS:  The sacroiliac joint spaces are maintained and there is no evidence of arthropathy involving those joints. Mild osteophyte formation of right hip is noted. No other bone abnormalities are seen.   IMPRESSION: Sacroiliac joints are unremarkable. Mild degenerative joint disease of right hip.  Mild levoscoliosis of lumbar spine is noted. Lateral subluxation to the right of L1 on L2 vertebral body is noted. Minimal  grade 1 anterolisthesis of L5-S1 is noted secondary to posterior facet joint hypertrophy. No fracture is noted. Severe degenerative disc disease is noted at L1-2 and L2-3. Moderate degenerative disc disease is noted at L3-4.   IMPRESSION: Multilevel degenerative changes as noted above. No acute abnormality seen.  COGNITION: Overall cognitive status: Within functional limits for tasks assessed     SENSATION: WFL  MUSCLE LENGTH: Hamstrings: mild tightness in RLE, some pain in back  POSTURE: rounded shoulders  PALPATION: Nontender palpation midline.  Tender palpation along the posterior and lateral iliac crest. TTP along lumbar spine especially at L1-L3.   LUMBAR ROM:   AROM eval  Flexion Can touch toes but pain coming down and back up too  Extension 50% with pain  Right lateral flexion WFL  Left lateral flexion WFL  Right rotation 75% with pain   Left rotation 75% with pain   (Blank rows = not tested)  LOWER EXTREMITY ROM:     Grossly WFL, some limitation in R knee due to pain and swelling. Pain with R hip flexion   LOWER EXTREMITY MMT:  4/5 BLE    LUMBAR SPECIAL TESTS:  Straight leg raise test: Negative and FABER test: Positive  FUNCTIONAL TESTS:  5 times sit to stand: 16.35s from chair  Timed up and go (TUG): 12.66s  GAIT: Distance walked: in clinic distances Assistive device utilized: None Level of assistance: Complete Independence Comments: slightly antalgic due to R knee pain, decrease knee flexion  TREATMENT DATE: 07/16/23- EVAL, HEP  PATIENT EDUCATION:  Education details: POC, HEP Person educated: Patient Education method: Explanation Education comprehension: verbalized understanding  HOME EXERCISE PROGRAM: Access Code: [email or web address] URL: [email or web address] Date: 07/16/2023 Prepared by: Cassie Freer  Exercises - Supine Lower Trunk Rotation  - 1 x daily - 7 x weekly - 2 sets - 10 reps - Supine Bridge  - 1 x daily - 7 x weekly - 2 sets - 10 reps - Supine Single Knee to Chest Stretch  - 1 x daily - 7 x weekly - 1 sets - 2 reps - 15 hold - Seated Figure 4 Piriformis Stretch  - 1 x daily - 7 x weekly - 2 reps - 15 hold - Seated Piriformis Stretch  - 1 x daily - 7 x weekly - 2 reps - 15 hold  ASSESSMENT:  CLINICAL IMPRESSION: Patient is a 88 y.o. female who was seen today for physical therapy evaluation and treatment for low back and hip pain. Most of her pain is isolated to her R posterior iliac crest. She reports increase in her pain with standing and walking activities, especially in her R hip and across the low back. She moves very well for her age and reports she has been active most of her life. Patient has significant foot deformities, bunions on both feet, and her R middle toe is fixed in a flexion contracture. She has a slight limp in her gait due to this and also from R knee pain, she wear a knee sleeve on it every day. Patient would like to come 1x due to copay, for a few visits to work on some general strengthening and be given exercises that she can work on at home to address her hip and low back pain.   OBJECTIVE IMPAIRMENTS: Abnormal gait, decreased mobility, decreased ROM, improper body mechanics, and pain.   ACTIVITY LIMITATIONS: sleeping, stairs, and locomotion level  PARTICIPATION LIMITATIONS: community activity and yard work  PERSONAL FACTORS: Time since onset of injury/illness/exacerbation are also affecting patient's functional outcome.   REHAB POTENTIAL: Good  CLINICAL DECISION MAKING: Stable/uncomplicated  EVALUATION COMPLEXITY: Low  GOALS: Goals reviewed with patient? Yes  SHORT TERM GOALS: Target date: 08/06/23  Patient will be independent with initial HEP.  Baseline: given on 07/16/23 Goal status: INITIAL  2.  Patient will complete 5xSTS < 13s   Baseline: 16.35s Goal status: INITIAL   LONG TERM GOALS: Target date: 08/27/23  Patient will be independent with advanced/ongoing HEP to improve outcomes and carryover.  Baseline:  Goal status: INITIAL  2.  Patient will report 50-75% improvement in low back pain to improve QOL.  Baseline: 10/10 w/activity Goal status: INITIAL  3.  Patient will demonstrate full pain free lumbar ROM to perform ADLs.   Baseline: see chart Goal status: INITIAL  4.  Patient will demonstrate supine figure with no pain and increased flexibility  Baseline: painful and tight both side Goal status: INITIAL  5.  Patient will tolerate 30 min of (standing/walking) to perform household chores without pain. Baseline: 10-30mins max Goal status: INITIAL   PLAN:  PT FREQUENCY: 1x/week  PT DURATION: 6 weeks  PLANNED INTERVENTIONS: 97110-Therapeutic exercises, 97530- Therapeutic activity, 97112- Neuromuscular re-education, 97535- Self Care, 54098- Manual therapy, [phone]- Gait training, [phone]- Electrical stimulation (unattended), Patient/Family education, Balance training, Stair training, Taping, Dry Needling, Joint mobilization, Spinal mobilization, Cryotherapy, and Moist heat.  PLAN FOR NEXT SESSION: low back and hip stretching and strengthening, how is HEP?    [place], [employer] 07/16/2023, 6:26 PM

## 2023-07-31 ENCOUNTER — Other Ambulatory Visit: Payer: Self-pay | Admitting: Internal Medicine

## 2023-07-31 ENCOUNTER — Other Ambulatory Visit: Payer: Self-pay | Admitting: Cardiovascular Disease

## 2023-08-01 ENCOUNTER — Telehealth: Payer: Self-pay | Admitting: Cardiovascular Disease

## 2023-08-01 NOTE — Telephone Encounter (Signed)
 Patient states she was not able to tolerate the atorvastatin  that she had been switched to. She reports she told Dr. Alvenia Aus this and he told her to resume taking rosuvastatin  20 mg daily.  She is requesting refill on rosuvastatin  20 mg daily. She would like a 90 day supply sent to [facility].  Will forward to Dr. Alvenia Aus to review.

## 2023-08-01 NOTE — Telephone Encounter (Signed)
 Pt called in requested a refill on Rosuvastatin . It is not longer on her med list, please advise. She would like it sent to [facility].   [facility] [address] [place], [place] - [address] Phone: [phone]  Fax: [phone]

## 2023-08-02 NOTE — Telephone Encounter (Signed)
 Yes, that is fine.

## 2023-08-05 MED ORDER — ROSUVASTATIN CALCIUM 20 MG PO TABS: 20.0000 mg | ORAL_TABLET | Freq: Every day | ORAL | 3 refills | Status: DC

## 2023-08-05 MED ORDER — ROSUVASTATIN CALCIUM 20 MG PO TABS: 20.0000 mg | ORAL_TABLET | Freq: Every day | ORAL | 2 refills | Status: DC

## 2023-08-05 NOTE — Telephone Encounter (Signed)
 Notified patient Rx for rosuvastatin  20 mg every day has been sent to pharmacy. Patient verbalized understanding and expressed appreciation for call.

## 2023-08-05 NOTE — Telephone Encounter (Signed)
Pt's medication was resent to pt's pharmacy. Confirmation received.  

## 2023-08-05 NOTE — Addendum Note (Signed)
 Addended by: Gayleen Kawasaki D on: 08/05/2023 04:34 PM   Modules accepted: Orders

## 2023-08-06 ENCOUNTER — Ambulatory Visit: Admitting: Physical Therapy

## 2023-08-12 ENCOUNTER — Other Ambulatory Visit: Payer: Self-pay | Admitting: Internal Medicine

## 2023-08-13 ENCOUNTER — Ambulatory Visit: Admitting: Physical Therapy

## 2023-08-20 ENCOUNTER — Ambulatory Visit: Admitting: Physical Therapy

## 2023-08-27 ENCOUNTER — Ambulatory Visit: Admitting: Physical Therapy

## 2023-08-27 ENCOUNTER — Ambulatory Visit: Admitting: Family Medicine

## 2023-09-06 ENCOUNTER — Other Ambulatory Visit: Payer: Self-pay | Admitting: Internal Medicine

## 2023-09-16 NOTE — Progress Notes (Deleted)
 Cardiology Clinic Note   Patient Name: JOLIYAH LIPPENS Date of Encounter: 09/16/2023  Primary Care Provider:  Colene Dauphin, MD Primary Cardiologist:  Antionette Kirks, MD  Patient Profile    Eva Hikes 88 year old female presents to the clinic today for follow-up evaluation of her syncope and essential hypertension.  Past Medical History    Past Medical History:  Diagnosis Date   B12 deficiency    Back pain    Chronic   Goiter    Grief    Grief and loss related depression '08   Hypertension    TMJ (dislocation of temporomandibular joint)    Right Side   Vitamin D  deficiency    Past Surgical History:  Procedure Laterality Date   EYE SURGERY  2010   cataract surgery with implants   KNEE SURGERY     Right Arthroscopy '05 Dr Bernard Brick   THYROIDECTOMY     August '09 for goiter (Dr Virgia Griffins)   TUBAL LIGATION      Allergies  Allergies  Allergen Reactions   Codeine  Nausea And Vomiting    Dizzy   Nexletol [Bempedoic Acid]     Nausea and weakness    History of Present Illness    Luciana A Robleto has a PMH of peripheral arterial disease, essential hypertension, syncope, type 2 diabetes, GERD, osteoarthritis, hyperlipidemia, and pulmonary fibrosis.  Lexiscan  Myoview  7/22 which showed no evidence of ischemia and normal EF.  Renal artery duplex has also been completed and showed no evidence of renal artery stenosis.  She also has hammertoes on both feet.  She has had significant discomfort affecting the right second toe and was previously noted to have some discoloration.  She was planned to have surgery and was referred for vascular evaluation by Dr. Alvenia Aus.  She underwent noninvasive evaluation 8/21 which showed ABIs of 0.78 on the right and 0.65 on the left.  Duplex study showed severe stenosis of right popliteal artery and occluded posterior tibial artery.  On her left side there were also severe stenosis in the popliteal artery extending to the TP trunk and occluded posterior  tibial artery.  Her pulmonary fibrosis is followed by pulmonology.  She was seen and evaluated by Dr. Alvenia Aus in follow-up on 03/19/2023.  During that time she reported that she had a syncopal episode with standing up.  She felt dizzy and had a brief loss of consciousness.  She denied chest pain, dyspnea she denied calf claudication.  Her physical activity was limited due to low back pain and hip discomfort.  She wore a cardiac event monitor which showed short runs of SVT and no significant events that could cause syncope.  She presents to the clinic today for follow-up evaluation and states***.  *** denies chest pain, shortness of breath, lower extremity edema, fatigue, palpitations, melena, hematuria, hemoptysis, diaphoresis, weakness, presyncope, syncope, orthopnea, and PND.  Essential hypertension-BP today*** Maintain blood pressure log Continue amlodipine , losartan , carvedilol   Syncope-denies further episodes of lightheadedness, presyncope or syncope.  Cardiac event monitor reassuring.  Details above. Maintain p.o. hydration Change positions slowly Lower extremity support stockings  Peripheral arterial disease-denies lower extremity claudication.  Previous ABIs showed right ABI of 0.78 and 0.65 on the left. Increase physical activity as tolerated Heart healthy low-sodium high-fiber diet Continue aspirin, amlodipine , rosuvastatin  Repeat ABIs and LEAs  Hyperlipidemia-LDL***. High-fiber diet Continue aspirin, rosuvastatin  Repeat fasting lipids and LFTs  Pulmonary fibrosis-breathing at baseline.  Able to perform activities of daily living. Continue current medical therapy  Follows with pulmonology  Disposition: Follow-up with Dr. Alvenia Aus  in 6 months.   Home Medications    Prior to Admission medications   Medication Sig Start Date End Date Taking? Authorizing Provider  albuterol  (VENTOLIN  HFA) 108 (90 Base) MCG/ACT inhaler Inhale 2 puffs into the lungs every 6 (six) hours as needed  (cough or shortness of breath). 05/25/22   Colene Dauphin, MD  amLODipine  (NORVASC ) 10 MG tablet Take 1 tablet by mouth once daily 07/01/23   Colene Dauphin, MD  aspirin 81 MG EC tablet Take 81 mg by mouth daily.    [provider]  Baclofen  5 MG TABS Take 1 tablet (5 mg total) by mouth 2 (two) times daily as needed (back pain). 09/21/22   Colene Dauphin, MD  Calcium  Carbonate-Vit D-Min (CALCIUM  1200 PO) Take by mouth.    [provider]  carvedilol  (COREG ) 6.25 MG tablet Take 1 tablet by mouth twice daily 10/03/22   Arida, Muhammad A, MD  cholecalciferol (VITAMIN D3) 25 MCG (1000 UNIT) tablet Take 1,000 Units by mouth daily.    [provider]  famotidine  (PEPCID ) 20 MG tablet Take 1 tablet by mouth twice daily as needed 09/06/23   Colene Dauphin, MD  levothyroxine  (SYNTHROID ) 50 MCG tablet TAKE 1 TABLET BY MOUTH ONCE DAILY BEFORE BREAKFAST 10/03/22   Burns, Beckey Bourgeois, MD  losartan  (COZAAR ) 100 MG tablet TAKE 1 TABLET BY MOUTH ONCE DAILY --NEEDS  RETURN  OFFICE  FOLLOW  UP  FOR  FUTURE  REFILLS 09/06/23   Colene Dauphin, MD  metFORMIN  (GLUCOPHAGE -XR) 500 MG 24 hr tablet Take 1 tablet by mouth once daily with breakfast 08/12/23   Colene Dauphin, MD  rosuvastatin  (CRESTOR ) 20 MG tablet Take 1 tablet (20 mg total) by mouth daily. 08/05/23   Wenona Hamilton, MD  vitamin B-12 (CYANOCOBALAMIN ) 1000 MCG tablet Take 1,000 mcg by mouth daily.    [provider]    Family History    Family History  Problem Relation Age of Onset   Cancer Mother    Heart disease Father        CAD/MI   Heart disease Brother        CAD/MI   Heart disease Brother        CAD/MI   Cancer Brother    Diabetes Neg Hx    She indicated that her mother is deceased. She indicated that her father is deceased. She indicated that all of her three brothers are deceased. She indicated that the status of her neg hx is unknown.  Social History    Social History   Socioeconomic History   Marital  status: Single    Spouse name: Not on file   Number of children: 2   Years of education: 12   Highest education level: Not on file  Occupational History   Occupation: retired    Associate Professor: RETIRED  Tobacco Use   Smoking status: Never    Passive exposure: Past   Smokeless tobacco: Never  Vaping Use   Vaping status: Never Used  Substance and Sexual Activity   Alcohol use: No    Alcohol/week: 0.0 standard drinks of alcohol   Drug use: Never   Sexual activity: Never  Other Topics Concern   Not on file  Social History Narrative   HSG. [place]. 2 sons- 2060-09-29 (died lung cancer Sep 22, 2023), 2067-09-30; 3 grandchildren. 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 Lives alone: I-ADLS,  lives in [facility] complex.. End of life issues: patient would want CPR, DNI, not to be maintained in a vegative state. Laymen's guide packet provided (july '11)   Social Drivers of Health   Financial Resource Strain: Low Risk  (06/18/2022)   Overall Financial Resource Strain (CARDIA)    Difficulty of Paying Living Expenses: Not hard at all  Food Insecurity: No Food Insecurity (06/18/2022)   Hunger Vital Sign    Worried About Running Out of Food in the Last Year: Never true    Ran Out of Food in the Last Year: Never true  Transportation Needs: No Transportation Needs (06/18/2022)   PRAPARE - Administrator, Civil Service (Medical): No    Lack of Transportation (Non-Medical): No  Physical Activity: Inactive (06/18/2022)   Exercise Vital Sign    Days of Exercise per Week: 0 days    Minutes of Exercise per Session: 0 min  Stress: No Stress Concern Present (06/18/2022)   [employer] of Occupational Health - Occupational Stress Questionnaire    Feeling of Stress : Not at all  Social Connections: Moderately Isolated (06/18/2022)   Social Connection and Isolation Panel [NHANES]    Frequency of Communication with Friends and Family: More than three times a week     Frequency of Social Gatherings with Friends and Family: More than three times a week    Attends Religious Services: Never    Database administrator or Organizations: Yes    Attends Banker Meetings: Never    Marital Status: Never married  Intimate Partner Violence: Not At Risk (06/18/2022)   Humiliation, Afraid, Rape, and Kick questionnaire    Fear of Current or Ex-Partner: No    Emotionally Abused: No    Physically Abused: No    Sexually Abused: No     Review of Systems    General:  No chills, fever, night sweats or weight changes.  Cardiovascular:  No chest pain, dyspnea on exertion, edema, orthopnea, palpitations, paroxysmal nocturnal dyspnea. Dermatological: No rash, lesions/masses Respiratory: No cough, dyspnea Urologic: No hematuria, dysuria Abdominal:   No nausea, vomiting, diarrhea, bright red blood per rectum, melena, or hematemesis Neurologic:  No visual changes, wkns, changes in mental status. All other systems reviewed and are otherwise negative except as noted above.  Physical Exam    VS:  There were no vitals taken for this visit. , BMI There is no height or weight on file to calculate BMI. GEN: Well nourished, well developed, in no acute distress. HEENT: normal. Neck: Supple, no JVD, carotid bruits, or masses. Cardiac: RRR, no murmurs, rubs, or gallops. No clubbing, cyanosis, edema.  Radials/DP/PT 2+ and equal bilaterally.  Respiratory:  Respirations regular and unlabored, clear to auscultation bilaterally. GI: Soft, nontender, nondistended, BS + x 4. MS: no deformity or atrophy. Skin: warm and dry, no rash. Neuro:  Strength and sensation are intact. Psych: Normal affect.  Accessory Clinical Findings    Recent Labs: 02/22/2023: ALT 12; BUN 23; Creatinine, Ser 1.01; Hemoglobin 13.1; Platelets 261.0; Potassium 4.1; Sodium 139; TSH 1.90   Recent Lipid Panel    Component Value Date/Time   CHOL 179 02/22/2023 1446   CHOL 157 06/20/2021 1100   TRIG  (H) 02/22/2023 1446    426.0 Triglyceride is over 400; calculations on Lipids are invalid.   HDL 48.50 02/22/2023 1446   HDL 50 06/20/2021 1100   CHOLHDL 4 02/22/2023 1446   VLDL 61.2 (H) 09/21/2022 1529   LDLCALC 69  06/20/2021 1100   LDLDIRECT 88.0 02/22/2023 1446    No BP recorded.  {Refresh Note OR Click here to enter BP  :1}***    ECG personally reviewed by me today- ***     Cardiac event monitor 03/19/2023  Patch Wear Time:  13 days and 23 hours (2024-12-17T14:18:55-0500 to 2024-12-31T14:18:47-0500)   Patient had a min HR of 43 bpm, max HR of 174 bpm, and avg HR of 64 bpm. Predominant underlying rhythm was Sinus Rhythm. 5 Ventricular Tachycardia runs occurred, the run with the fastest interval lasting 4 beats with a max rate of 146 bpm, the longest  lasting 5 beats with an avg rate of 106 bpm. 70 Supraventricular Tachycardia runs occurred, the run with the fastest interval lasting 1 min 1 sec with a max rate of 174 bpm, the longest lasting 1 min 12 secs with an avg rate of 152 bpm.  Occasional PVCs with a burden of 2.1%     Assessment & Plan   1.  ***   Chet Cota. Timberly Yott NP-C     09/16/2023, 12:54 PM [facility] [address] Office [phone] Fax [phone]    I spent***minutes examining this patient, reviewing medications, and using patient centered shared decision making involving their cardiac care.   I spent  20 minutes reviewing past medical history,  medications, and prior cardiac tests.

## 2023-09-17 ENCOUNTER — Telehealth: Payer: Self-pay | Admitting: Internal Medicine

## 2023-09-17 NOTE — Telephone Encounter (Signed)
 Copied from CRM [id number]. Topic: Clinical - Medical Advice >> Sep 17, 2023 12:33 PM Allyne Areola wrote: Reason for CRM: Cathleen Coach // [facility] [employer] is calling because patient is scheduled to see Dr.Burns on Friday 09/20/2023 and they would like to verify if patient should be checking her sugar levels and if so they use the one touch glucometer. Best call back number is [phone].

## 2023-09-19 ENCOUNTER — Ambulatory Visit: Admitting: General Practice

## 2023-09-19 NOTE — Progress Notes (Unsigned)
 Subjective:    Patient ID: Eva Hikes, female    DOB: Sep 05, 1935, 88 y.o.   MRN: [id number]     HPI Orian is here for follow up of her chronic medical problems.  Back, hip pain -she did so if sports medicine and it was thought that her pain was related to muscle dysfunction and spasm.  She was referred to physical therapy for 8 weeks.  She went to PT once and did not go for more.    She is taking all of her medications as prescribed.    Medications and allergies reviewed with patient and updated if appropriate.  Current Outpatient Medications on File Prior to Visit  Medication Sig Dispense Refill   albuterol  (VENTOLIN  HFA) 108 (90 Base) MCG/ACT inhaler Inhale 2 puffs into the lungs every 6 (six) hours as needed (cough or shortness of breath). 8 g 1   amLODipine  (NORVASC ) 10 MG tablet Take 1 tablet by mouth once daily 90 tablet 0   aspirin 81 MG EC tablet Take 81 mg by mouth daily.     Baclofen  5 MG TABS Take 1 tablet (5 mg total) by mouth 2 (two) times daily as needed (back pain). 60 tablet 0   Calcium  Carbonate-Vit D-Min (CALCIUM  1200 PO) Take by mouth.     carvedilol  (COREG ) 6.25 MG tablet Take 1 tablet by mouth twice daily 180 tablet 3   cholecalciferol (VITAMIN D3) 25 MCG (1000 UNIT) tablet Take 1,000 Units by mouth daily.     famotidine  (PEPCID ) 20 MG tablet Take 1 tablet by mouth twice daily as needed 60 tablet 0   levothyroxine  (SYNTHROID ) 50 MCG tablet TAKE 1 TABLET BY MOUTH ONCE DAILY BEFORE BREAKFAST 90 tablet 3   losartan  (COZAAR ) 100 MG tablet TAKE 1 TABLET BY MOUTH ONCE DAILY --NEEDS  RETURN  OFFICE  FOLLOW  UP  FOR  FUTURE  REFILLS 30 tablet 0   metFORMIN  (GLUCOPHAGE -XR) 500 MG 24 hr tablet Take 1 tablet by mouth once daily with breakfast 90 tablet 0   rosuvastatin  (CRESTOR ) 20 MG tablet Take 1 tablet (20 mg total) by mouth daily. 90 tablet 2   vitamin B-12 (CYANOCOBALAMIN ) 1000 MCG tablet Take 1,000 mcg by mouth daily.     No current facility-administered  medications on file prior to visit.     Review of Systems     Objective:   Vitals:   09/20/23 1453  BP: (!) 142/70  Pulse: 76  Temp: 98.8 F (37.1 C)  SpO2: 90%   BP Readings from Last 3 Encounters:  09/20/23 (!) 142/70  07/02/23 (!) 142/82  06/20/23 112/60   Wt Readings from Last 3 Encounters:  09/20/23 153 lb (69.4 kg)  07/02/23 148 lb (67.1 kg)  06/20/23 150 lb 6.4 oz (68.2 kg)   Body mass index is 26.26 kg/m.    Physical Exam Constitutional:      General: She is not in acute distress.    Appearance: Normal appearance. She is not ill-appearing.  HENT:     Head: Normocephalic and atraumatic.   Musculoskeletal:        General: Tenderness (tenderness with palpation of lateral right iliac crest, no lateral right leg pain) present.     Right lower leg: No edema.     Left lower leg: No edema.   Skin:    General: Skin is dry.   Neurological:     Mental Status: She is alert.  Lab Results  Component Value Date   WBC 8.5 02/22/2023   HGB 13.1 02/22/2023   HCT 40.3 02/22/2023   PLT 261.0 02/22/2023   GLUCOSE 94 02/22/2023   CHOL 179 02/22/2023   TRIG (H) 02/22/2023    426.0 Triglyceride is over 400; calculations on Lipids are invalid.   HDL 48.50 02/22/2023   LDLDIRECT 88.0 02/22/2023   LDLCALC 69 06/20/2021   ALT 12 02/22/2023   AST 17 02/22/2023   NA 139 02/22/2023   K 4.1 02/22/2023   CL 104 02/22/2023   CREATININE 1.01 02/22/2023   BUN 23 02/22/2023   CO2 28 02/22/2023   TSH 1.90 02/22/2023   HGBA1C 6.2 02/22/2023   MICROALBUR 3.1 (H) 02/22/2023     Assessment & Plan:    See Problem List for Assessment and Plan of chronic medical problems.

## 2023-09-20 ENCOUNTER — Encounter: Payer: Self-pay | Admitting: Internal Medicine

## 2023-09-20 ENCOUNTER — Ambulatory Visit (INDEPENDENT_AMBULATORY_CARE_PROVIDER_SITE_OTHER): Admitting: Internal Medicine

## 2023-09-20 VITALS — BP 142/70 | HR 76 | Temp 98.8°F | Ht 64.0 in | Wt 153.0 lb

## 2023-09-20 DIAGNOSIS — M6289 Other specified disorders of muscle: Secondary | ICD-10-CM | POA: Insufficient documentation

## 2023-09-20 DIAGNOSIS — I10 Essential (primary) hypertension: Secondary | ICD-10-CM

## 2023-09-20 DIAGNOSIS — K219 Gastro-esophageal reflux disease without esophagitis: Secondary | ICD-10-CM | POA: Diagnosis not present

## 2023-09-20 NOTE — Assessment & Plan Note (Signed)
Chronic ?GERD controlled ?Continue famotidine 20 mg twice daily ?

## 2023-09-20 NOTE — Telephone Encounter (Signed)
 Sugars are controlled so she can check them if she wishes at home, but I am okay with her just having them checked here twice a year.

## 2023-09-20 NOTE — Patient Instructions (Addendum)
      Medications changes include :   None    Use ice and do exercises for the hip pain - which is likely -  Tensor fascia lata syndrome

## 2023-09-20 NOTE — Telephone Encounter (Signed)
Message left today 

## 2023-09-21 NOTE — Assessment & Plan Note (Signed)
 Subacute Discussed her pain is like from a muscle attaching to the iliac crest Did not continue with PT - some of the exercises caused pain Advised to do the exercises that did not cause pain

## 2023-09-21 NOTE — Assessment & Plan Note (Signed)
 Chronic Blood pressure is controlled for her age Continue amlodipine  10 mg daily, Coreg  6.25 mg twice daily, losartan  100 mg daily

## 2023-10-01 ENCOUNTER — Telehealth: Payer: Self-pay

## 2023-10-01 NOTE — Telephone Encounter (Signed)
 Copied from CRM [id number]. Topic: General - Other >> Oct 01, 2023  3:22 PM Franky GRADE wrote: Reason for CRM: Damian from [facility] is calling to speak with Dr.Burns nurse and give an update on patient's pulmonary status. Best call back number [phone].

## 2023-10-02 ENCOUNTER — Ambulatory Visit: Admitting: Podiatry

## 2023-10-02 ENCOUNTER — Encounter: Payer: Self-pay | Admitting: Podiatry

## 2023-10-02 VITALS — Ht 64.0 in | Wt 153.0 lb

## 2023-10-02 DIAGNOSIS — D2371 Other benign neoplasm of skin of right lower limb, including hip: Secondary | ICD-10-CM

## 2023-10-02 NOTE — Progress Notes (Signed)
   Chief Complaint  Patient presents with   Callouses    Pt is here due to callous on the bottom of her right foot, states it is very painful to walk on.    Subjective: 88 y.o. female presenting to the office today for follow up evaluation of a symptomatic skin lesion to the plantar aspect of the right foot second MTP.  Patient states that she feels significant relief for a few months after debridement but the lesion returns over time.  She does not go barefoot.   Past Medical History:  Diagnosis Date   B12 deficiency    Back pain    Chronic   Goiter    Grief    Grief and loss related depression '08   Hypertension    TMJ (dislocation of temporomandibular joint)    Right Side   Vitamin D  deficiency     Past Surgical History:  Procedure Laterality Date   EYE SURGERY  2010   cataract surgery with implants   KNEE SURGERY     Right Arthroscopy '05 Dr Ernie   THYROIDECTOMY     August '09 for goiter (Dr Roark)   TUBAL LIGATION      Allergies  Allergen Reactions   Codeine  Nausea And Vomiting    Dizzy   Nexletol [Bempedoic Acid]     Nausea and weakness     Objective:  Physical Exam General: Alert and oriented x3 in no acute distress  Dermatology: Hyperkeratotic lesion(s) present on the plantar aspect of the right forefoot plantar second MTP joint. Pain on palpation with a central nucleated core noted. Skin is warm, dry and supple bilateral lower extremities. Negative for open lesions or macerations.  Vascular: Palpable pedal pulses bilaterally. No edema or erythema noted. Capillary refill within normal limits.  Neurological: Grossly intact via light touch  Musculoskeletal Exam: Pain on palpation at the keratotic lesion(s) noted.  Hammertoe of the second digit and hallux valgus deformity noted bilateral worse on the right  Assessment: 1.  Symptomatic callus; benign skin lesion plantar second MTP 2.  Hammertoe second digit right    Plan of Care:  -Patient  evaluated -Excisional debridement of keratoic lesion(s) using a chisel blade was performed without incident.  Salicylic acid applied with a light dressing -Dressed area with light dressing. -Patient would like to continue conservative treatment for the hammertoe deformity.  We did discuss likely surgery versus surgical management and she would like to pursue conservative treatment.   -Patient is to return to the clinic PRN.   Thresa EMERSON Sar, DPM [facility]  Dr. Thresa EMERSON Sar, DPM    [address] [place], [place] [address]                Office [phone]  Fax [phone]

## 2023-10-03 NOTE — Telephone Encounter (Signed)
 She has the option of seeing a lung doctor that specifically treats pulmonary fibrosis-this is not Dr. Darlean.  She just needs to call the office and make an appointment with the pulmonary fibrosis specialist-she does not need a new referral.

## 2023-10-03 NOTE — Telephone Encounter (Signed)
 Message left for [facility] with Dr. Jinny recommendations.

## 2023-10-03 NOTE — Telephone Encounter (Signed)
 Message left for patient to return call to clinic.

## 2023-10-08 ENCOUNTER — Other Ambulatory Visit: Payer: Self-pay | Admitting: Internal Medicine

## 2023-10-09 ENCOUNTER — Ambulatory Visit

## 2023-10-09 VITALS — Ht 64.0 in | Wt 153.0 lb

## 2023-10-09 DIAGNOSIS — E118 Type 2 diabetes mellitus with unspecified complications: Secondary | ICD-10-CM

## 2023-10-09 DIAGNOSIS — Z0000 Encounter for general adult medical examination without abnormal findings: Secondary | ICD-10-CM

## 2023-10-09 DIAGNOSIS — Z780 Asymptomatic menopausal state: Secondary | ICD-10-CM

## 2023-10-09 DIAGNOSIS — M8580 Other specified disorders of bone density and structure, unspecified site: Secondary | ICD-10-CM | POA: Diagnosis not present

## 2023-10-09 NOTE — Progress Notes (Cosign Needed Addendum)
 Subjective:   Kayla Craig is a 88 y.o. who presents for a Medicare Wellness preventive visit.  As a reminder, Annual Wellness Visits don't include a physical exam, and some assessments may be limited, especially if this visit is performed virtually. We may recommend an in-person follow-up visit with your provider if needed.  Visit Complete: Virtual I connected with  Lorrin DELENA Solian on 10/09/23 by a audio enabled telemedicine application and verified that I am speaking with the correct person using two identifiers.  Patient Location: Home  Provider Location: Home Office  I discussed the limitations of evaluation and management by telemedicine. The patient expressed understanding and agreed to proceed.  Vital Signs: Because this visit was a virtual/telehealth visit, some criteria may be missing or patient reported. Any vitals not documented were not able to be obtained and vitals that have been documented are patient reported.  VideoDeclined- This patient declined Librarian, academic. Therefore the visit was completed with audio only.  Persons Participating in Visit: Patient.  AWV Questionnaire: No: Patient Medicare AWV questionnaire was not completed prior to this visit.  Cardiac Risk Factors include: advanced age (>40men, >81 women);hypertension;diabetes mellitus;dyslipidemia;Other (see comment), Risk factor comments: PAD     Objective:    Today's Vitals   10/09/23 1419  Weight: 153 lb (69.4 kg)  Height: 5' 4 (1.626 m)   Body mass index is 26.26 kg/m.     10/09/2023    2:26 PM 11/19/2022    1:16 AM 06/18/2022    2:52 PM 03/30/2021   10:07 AM 02/18/2020   11:28 AM 01/28/2018    9:38 AM 01/25/2017    9:41 AM  Advanced Directives  Does Patient Have a Medical Advance Directive? Yes No No Yes Yes Yes  Yes   Type of Estate agent of [place];Living will   Living will Living will Healthcare Power of [place];Living will  Healthcare Power of [place];Living will  Does patient want to make changes to medical advance directive?    No - Patient declined No - Patient declined    Copy of Healthcare Power of Attorney in Chart? No - copy requested     No - copy requested  No - copy requested   Would patient like information on creating a medical advance directive?   No - Patient declined         Data saved with a previous flowsheet row definition    Current Medications (verified) Outpatient Encounter Medications as of 10/09/2023  Medication Sig   albuterol  (VENTOLIN  HFA) 108 (90 Base) MCG/ACT inhaler Inhale 2 puffs into the lungs every 6 (six) hours as needed (cough or shortness of breath).   amLODipine  (NORVASC ) 10 MG tablet Take 1 tablet by mouth once daily   aspirin 81 MG EC tablet Take 81 mg by mouth daily.   Baclofen  5 MG TABS Take 1 tablet (5 mg total) by mouth 2 (two) times daily as needed (back pain).   Calcium  Carbonate-Vit D-Min (CALCIUM  1200 PO) Take by mouth.   carvedilol  (COREG ) 6.25 MG tablet Take 1 tablet by mouth twice daily   cholecalciferol (VITAMIN D3) 25 MCG (1000 UNIT) tablet Take 1,000 Units by mouth daily.   famotidine  (PEPCID ) 20 MG tablet Take 1 tablet by mouth twice daily as needed   levothyroxine  (SYNTHROID ) 50 MCG tablet TAKE 1 TABLET BY MOUTH ONCE DAILY BEFORE BREAKFAST   losartan  (COZAAR ) 100 MG tablet TAKE 1 TABLET BY MOUTH ONCE DAILY ---NEEDS  RETURN  OFFICE  FOLLOW  UP  FOR  FUTURE  REFILLS   metFORMIN  (GLUCOPHAGE -XR) 500 MG 24 hr tablet Take 1 tablet by mouth once daily with breakfast   rosuvastatin  (CRESTOR ) 20 MG tablet Take 1 tablet (20 mg total) by mouth daily.   vitamin B-12 (CYANOCOBALAMIN ) 1000 MCG tablet Take 1,000 mcg by mouth daily.   No facility-administered encounter medications on file as of 10/09/2023.    Allergies (verified) Codeine  and Nexletol [bempedoic acid]   History: Past Medical History:  Diagnosis Date   B12 deficiency    Back pain    Chronic   Goiter     Grief    Grief and loss related depression '08   Hypertension    TMJ (dislocation of temporomandibular joint)    Right Side   Vitamin D  deficiency    Past Surgical History:  Procedure Laterality Date   EYE SURGERY  2008-11-12   cataract surgery with implants   KNEE SURGERY     Right Arthroscopy '05 Dr Ernie   THYROIDECTOMY     August '09 for goiter (Dr Roark)   TUBAL LIGATION     Family History  Problem Relation Age of Onset   Cancer Mother    Heart disease Father        CAD/MI   Heart disease Brother        CAD/MI   Heart disease Brother        CAD/MI   Cancer Brother    Diabetes Neg Hx    Social History   Socioeconomic History   Marital status: Single    Spouse name: Not on file   Number of children: 2   Years of education: 12   Highest education level: Not on file  Occupational History   Occupation: retired    Associate Professor: RETIRED  Tobacco Use   Smoking status: Never    Passive exposure: Past   Smokeless tobacco: Never  Vaping Use   Vaping status: Never Used  Substance and Sexual Activity   Alcohol use: No    Alcohol/week: 0.0 standard drinks of alcohol   Drug use: Never   Sexual activity: Never  Other Topics Concern   Not on file  Social History Narrative   HSG. [place]. 2 sons- 12-Nov-2060 (died lung cancer 11-06-23), 11-13-67; 3 grandchildren. 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 End of life issues: patient would want CPR, DNI, not to be maintained in a vegative state. Laymen's guide packet provided 11/13/23 '11)      Lives alone/2025   Social Drivers of Health   Financial Resource Strain: Low Risk  (10/09/2023)   Overall Financial Resource Strain (CARDIA)    Difficulty of Paying Living Expenses: Not hard at all  Food Insecurity: No Food Insecurity (10/09/2023)   Hunger Vital Sign    Worried About Running Out of Food in the Last Year: Never true    Ran Out of Food in the  Last Year: Never true  Transportation Needs: No Transportation Needs (10/09/2023)   PRAPARE - Administrator, Civil Service (Medical): No    Lack of Transportation (Non-Medical): No  Physical Activity: Inactive (10/09/2023)   Exercise Vital Sign    Days of Exercise per Week: 0 days    Minutes of Exercise per Session: 0 min  Stress: No Stress Concern Present (10/09/2023)   [employer] of Occupational Health - Occupational Stress Questionnaire    Feeling  of Stress: Not at all  Social Connections: Socially Isolated (10/09/2023)   Social Connection and Isolation Panel    Frequency of Communication with Friends and Family: Three times a week    Frequency of Social Gatherings with Friends and Family: More than three times a week    Attends Religious Services: Never    Database administrator or Organizations: No    Attends Engineer, structural: Never    Marital Status: Never married    Tobacco Counseling Counseling given: Not Answered    Clinical Intake:  Pre-visit preparation completed: Yes  Pain : No/denies pain     BMI - recorded: 26.26 Nutritional Status: BMI 25 -29 Overweight Nutritional Risks: None Diabetes: Yes CBG done?: No Did pt. bring in CBG monitor from home?: No  Lab Results  Component Value Date   HGBA1C 6.2 02/22/2023   HGBA1C 6.0 09/21/2022   HGBA1C 6.2 03/06/2022     How often do you need to have someone help you when you read instructions, pamphlets, or other written materials from your doctor or pharmacy?: 1 - Never  Interpreter Needed?: No  Information entered by :: Khori Underberg, RMA   Activities of Daily Living     10/09/2023    2:24 PM  In your present state of health, do you have any difficulty performing the following activities:  Hearing? 0  Vision? 0  Difficulty concentrating or making decisions? 0  Walking or climbing stairs? 0  Dressing or bathing? 0  Doing errands, shopping? 0  Preparing Food and eating ? N   Using the Toilet? N  In the past six months, have you accidently leaked urine? N  Do you have problems with loss of bowel control? N  Managing your Medications? N  Managing your Finances? N  Housekeeping or managing your Housekeeping? N    Patient Care Team: Geofm Glade PARAS, MD as PCP - General (Internal Medicine) Darron Deatrice LABOR, MD as PCP - Cardiology (Cardiology) Camillo Golas, MD (Ophthalmology) Ernie Cough, MD (Orthopedic Surgery) Szabat, Toribio BROCKS, [facility] (Inactive) as Pharmacist (Pharmacist)  I have updated your Care Teams any recent Medical Services you may have received from other providers in the past year.     Assessment:   This is a routine wellness examination for Diego.  Hearing/Vision screen Hearing Screening - Comments:: Denies hearing difficulties   Vision Screening - Comments:: Wears eyeglasses/ Dr. Camillo   Goals Addressed             This Visit's Progress    Patient Stated       Not at this time./2025       Depression Screen     10/09/2023    2:30 PM 09/20/2023    3:00 PM 06/18/2022    2:59 PM 06/12/2022    1:06 PM 05/25/2022    4:11 PM 03/06/2022    1:17 PM 03/30/2021   10:11 AM  PHQ 2/9 Scores  PHQ - 2 Score 1 1 1 1  0 0 0  PHQ- 9 Score 3 2 3 3 1       Fall Risk     10/09/2023    2:27 PM 09/20/2023    2:59 PM 06/12/2022    1:06 PM 05/25/2022    4:11 PM 03/06/2022    1:23 PM  Fall Risk   Falls in the past year? 0 0 0 1 0  Number falls in past yr: 0 0 0 0 0  Injury with Fall? 0 0  0 1 0  Risk for fall due to :  No Fall Risks No Fall Risks History of fall(s) No Fall Risks  Follow up Falls evaluation completed;Falls prevention discussed Falls evaluation completed Falls evaluation completed Falls evaluation completed Falls evaluation completed      Data saved with a previous flowsheet row definition    MEDICARE RISK AT HOME:  Medicare Risk at Home Any stairs in or around the home?: No If so, are there any without handrails?: No Home free of  loose throw rugs in walkways, pet beds, electrical cords, etc?: Yes Adequate lighting in your home to reduce risk of falls?: Yes Life alert?: No Use of a cane, walker or w/c?: No Grab bars in the bathroom?: Yes Shower chair or bench in shower?: Yes Elevated toilet seat or a handicapped toilet?: No  TIMED UP AND GO:  Was the test performed?  No  Cognitive Function: Declined/Normal: No cognitive concerns noted by patient or family. Patient alert, oriented, able to answer questions appropriately and recall recent events. No signs of memory loss or confusion.    01/28/2018   12:44 PM 12/14/2014   10:35 AM  MMSE - Mini Mental State Exam  Not completed:  --  Orientation to time 5   Orientation to Place 5   Registration 3   Attention/ Calculation 4   Recall 1   Language- name 2 objects 2   Language- repeat 1   Language- follow 3 step command 3   Language- read & follow direction 1   Write a sentence 1   Copy design 1   Total score 27         06/18/2022    2:53 PM  6CIT Screen  What Year? 0 points  What month? 0 points  What time? 0 points  Count back from 20 0 points  Months in reverse 0 points  Repeat phrase 0 points  Total Score 0 points    Immunizations Immunization History  Administered Date(s) Administered   Fluad Quad(high Dose 65+) 12/12/2018   Influenza Whole 01/31/2009, 02/28/2012   Influenza, High Dose Seasonal PF 01/06/2018, 12/12/2018, 12/03/2020, 01/09/2022, 12/19/2022   Influenza,inj,Quad PF,6+ Mos 01/11/2014   Influenza-Unspecified 01/29/2013, 12/22/2015, 12/08/2016, 01/09/2020   Moderna Sars-Covid-2 Vaccination 06/17/2019, 07/15/2019, 03/16/2020   Pneumococcal Conjugate-13 12/14/2014   Pneumococcal Polysaccharide-23 05/08/2012   Tetanus 05/08/2012    Screening Tests Health Maintenance  Topic Date Due   Diabetic kidney evaluation - Urine ACR  Never done   Zoster Vaccines- Shingrix (1 of 2) Never done   DTaP/Tdap/Td (1 - Tdap) 05/09/2012   FOOT  EXAM  07/17/2023   DEXA SCAN  08/11/2023   HEMOGLOBIN A1C  08/22/2023   OPHTHALMOLOGY EXAM  10/25/2023   INFLUENZA VACCINE  11/08/2023   Diabetic kidney evaluation - eGFR measurement  02/22/2024   Medicare Annual Wellness (AWV)  10/08/2024   Pneumococcal Vaccine: 50+ Years  Completed   Hepatitis B Vaccines  Aged Out   HPV VACCINES  Aged Out   Meningococcal B Vaccine  Aged Out   COVID-19 Vaccine  Discontinued    Health Maintenance  Health Maintenance Due  Topic Date Due   Diabetic kidney evaluation - Urine ACR  Never done   Zoster Vaccines- Shingrix (1 of 2) Never done   DTaP/Tdap/Td (1 - Tdap) 05/09/2012   FOOT EXAM  07/17/2023   DEXA SCAN  08/11/2023   HEMOGLOBIN A1C  08/22/2023   Health Maintenance Items Addressed: DEXA ordered, Diabetic Foot Exam recommended, Labs  Ordered: UACR and A1C, See Nurse Notes at the end of this note  Additional Screening:  Vision Screening: Recommended annual ophthalmology exams for early detection of glaucoma and other disorders of the eye. Would you like a referral to an eye doctor? No    Dental Screening: Recommended annual dental exams for proper oral hygiene  Community Resource Referral / Chronic Care Management: CRR required this visit?  No   CCM required this visit?  No   Plan:    I have personally reviewed and noted the following in the patient's chart:   Medical and social history Use of alcohol, tobacco or illicit drugs  Current medications and supplements including opioid prescriptions. Patient is not currently taking opioid prescriptions. Functional ability and status Nutritional status Physical activity Advanced directives List of other physicians Hospitalizations, surgeries, and ER visits in previous 12 months Vitals Screenings to include cognitive, depression, and falls Referrals and appointments  In addition, I have reviewed and discussed with patient certain preventive protocols, quality metrics, and best  practice recommendations. A written personalized care plan for preventive services as well as general preventive health recommendations were provided to patient.   Angenette Daily L Tarance Balan, CMA   10/09/2023   After Visit Summary: (Mail) Due to this being a telephonic visit, the after visit summary with patients personalized plan was offered to patient via mail   Notes: Patient is due for a Tdap and a Shingrix vaccine.  She is also due for a FOOT exam, a UACR and a A1C check, which she can get done HERE during her next office visit in September 2025.  Patient is due for a DEXA which order has been placed today.  Patient is also requesting a new meter to check her blood sugars.  Please advise.  She had no other concerns to address today.  Medical screening examination/treatment/procedure(s) were performed by non-physician practitioner and as supervising physician I was immediately available for consultation/collaboration.  I agree with above. Karlynn Noel, MD

## 2023-10-09 NOTE — Patient Instructions (Signed)
 Kayla Craig , Thank you for taking time out of your busy schedule to complete your Annual Wellness Visit with me. I enjoyed our conversation and look forward to speaking with you again next year. I, as well as your care team,  appreciate your ongoing commitment to your health goals. Please review the following plan we discussed and let me know if I can assist you in the future. Your Game plan/ To Do List    Referrals: If you haven't heard from the office you've been referred to, please reach out to them at the phone provided.  You have an order for:  [x]   Bone Density    Please call for appointment:  [facility] [address] [place], [place] [address] [phone]  Make sure to wear two-piece clothing.  No lotions, powders, or deodorants the day of the appointment. Make sure to bring picture ID and insurance card.  Bring list of medications you are currently taking including any supplements.   Follow up Visits: Next Medicare AWV with our clinical staff: 10/12/2024.   Have you seen your provider in the last 6 months (3 months if uncontrolled diabetes)? Yes Next Office Visit with your provider: 12/24/2023.  Clinician Recommendations:  Aim for 30 minutes of exercise or brisk walking, 6-8 glasses of water, and 5 servings of fruits and vegetables each day. You are due for a tetanus vaccine and a Shingles vaccine.  You can get both at your local pharmacy.  You are also due for a FOOT EXAM, a KIDNEY EVALUATION and a A1C check and should get these screening done during your next office visit.        This is a list of the screening recommended for you and due dates:  Health Maintenance  Topic Date Due   Yearly kidney health urinalysis for diabetes  Never done   Zoster (Shingles) Vaccine (1 of 2) Never done   DTaP/Tdap/Td vaccine (1 - Tdap) 05/09/2012   Complete foot exam   07/17/2023   DEXA scan (bone density measurement)  08/11/2023   Hemoglobin A1C  08/22/2023   Eye  exam for diabetics  10/25/2023   Flu Shot  11/08/2023   Yearly kidney function blood test for diabetes  02/22/2024   Medicare Annual Wellness Visit  10/08/2024   Pneumococcal Vaccine for age over 57  Completed   Hepatitis B Vaccine  Aged Out   HPV Vaccine  Aged Out   Meningitis B Vaccine  Aged Out   COVID-19 Vaccine  Discontinued    Advanced directives: (Copy Requested) Please bring a copy of your health care power of attorney and living will to the office to be added to your chart at your convenience. You can mail to [facility] [address] [place], [place] [address] or email to [email or web address] Advance Care Planning is important because it:  [x]  Makes sure you receive the medical care that is consistent with your values, goals, and preferences  [x]  It provides guidance to your family and loved ones and reduces their decisional burden about whether or not they are making the right decisions based on your wishes.  Follow the link provided in your after visit summary or read over the paperwork we have mailed to you to help you started getting your Advance Directives in place. If you need assistance in completing these, please reach out to us  so that we can help you!  See attachments for Preventive Care and Fall Prevention Tips.

## 2023-10-17 ENCOUNTER — Telehealth: Payer: Self-pay | Admitting: Internal Medicine

## 2023-10-17 NOTE — Telephone Encounter (Signed)
 PT now would like a referral sent to the following specialist Dr. Darlean recom::  Pulmonary follow up is as needed - happy to refer you to the pulmonary fibrosis clinic if you or Dr Geofm desire.

## 2023-10-17 NOTE — Telephone Encounter (Signed)
 Ok, then please schedule a visit with Mannam or Geronimo- this is what Dr Darlean means when he says fibrosis clinic. Thanks!  Also, it would be great if you can go ahead and mail her an ILD questionnaire.

## 2023-10-18 NOTE — Telephone Encounter (Signed)
 Appt made. ILD packet sent. NFN

## 2023-10-23 ENCOUNTER — Other Ambulatory Visit

## 2023-10-28 DIAGNOSIS — H35373 Puckering of macula, bilateral: Secondary | ICD-10-CM | POA: Diagnosis not present

## 2023-10-28 DIAGNOSIS — E119 Type 2 diabetes mellitus without complications: Secondary | ICD-10-CM | POA: Diagnosis not present

## 2023-10-28 DIAGNOSIS — H0102A Squamous blepharitis right eye, upper and lower eyelids: Secondary | ICD-10-CM | POA: Diagnosis not present

## 2023-10-28 DIAGNOSIS — Z961 Presence of intraocular lens: Secondary | ICD-10-CM | POA: Diagnosis not present

## 2023-10-28 DIAGNOSIS — H18513 Endothelial corneal dystrophy, bilateral: Secondary | ICD-10-CM | POA: Diagnosis not present

## 2023-10-28 LAB — HM DIABETES EYE EXAM

## 2023-10-29 ENCOUNTER — Other Ambulatory Visit: Payer: Self-pay | Admitting: Internal Medicine

## 2023-10-29 ENCOUNTER — Other Ambulatory Visit: Payer: Self-pay | Admitting: Cardiovascular Disease

## 2023-10-29 DIAGNOSIS — E1142 Type 2 diabetes mellitus with diabetic polyneuropathy: Secondary | ICD-10-CM | POA: Diagnosis not present

## 2023-10-29 DIAGNOSIS — E785 Hyperlipidemia, unspecified: Secondary | ICD-10-CM | POA: Diagnosis not present

## 2023-10-29 DIAGNOSIS — E1151 Type 2 diabetes mellitus with diabetic peripheral angiopathy without gangrene: Secondary | ICD-10-CM | POA: Diagnosis not present

## 2023-10-29 DIAGNOSIS — G8929 Other chronic pain: Secondary | ICD-10-CM | POA: Diagnosis not present

## 2023-10-29 DIAGNOSIS — R32 Unspecified urinary incontinence: Secondary | ICD-10-CM | POA: Diagnosis not present

## 2023-10-29 DIAGNOSIS — E663 Overweight: Secondary | ICD-10-CM | POA: Diagnosis not present

## 2023-10-29 DIAGNOSIS — E1169 Type 2 diabetes mellitus with other specified complication: Secondary | ICD-10-CM | POA: Diagnosis not present

## 2023-10-29 DIAGNOSIS — E039 Hypothyroidism, unspecified: Secondary | ICD-10-CM | POA: Diagnosis not present

## 2023-10-29 DIAGNOSIS — I739 Peripheral vascular disease, unspecified: Secondary | ICD-10-CM | POA: Diagnosis not present

## 2023-10-29 DIAGNOSIS — M1990 Unspecified osteoarthritis, unspecified site: Secondary | ICD-10-CM | POA: Diagnosis not present

## 2023-10-29 DIAGNOSIS — I10 Essential (primary) hypertension: Secondary | ICD-10-CM | POA: Diagnosis not present

## 2023-10-29 DIAGNOSIS — K219 Gastro-esophageal reflux disease without esophagitis: Secondary | ICD-10-CM | POA: Diagnosis not present

## 2023-11-05 ENCOUNTER — Ambulatory Visit (INDEPENDENT_AMBULATORY_CARE_PROVIDER_SITE_OTHER)
Admission: RE | Admit: 2023-11-05 | Discharge: 2023-11-05 | Disposition: A | Discharge status: Home / Self Care | Source: Ambulatory Visit | Attending: Internal Medicine | Admitting: Internal Medicine

## 2023-11-05 DIAGNOSIS — Z780 Asymptomatic menopausal state: Secondary | ICD-10-CM

## 2023-11-05 DIAGNOSIS — M8580 Other specified disorders of bone density and structure, unspecified site: Secondary | ICD-10-CM

## 2023-11-13 ENCOUNTER — Other Ambulatory Visit: Payer: Self-pay | Admitting: Internal Medicine

## 2023-11-14 IMAGING — CT CT CHEST HIGH RESOLUTION
2 of 7 series · 13 of 36 positions shown, 16 images · non-contrast
Comparison: 02/07/2021, 11/10/2020

CLINICAL DATA: IPF, weight loss



[Series 4: chest 2.00 br36 s3 cor soft · coronal · 0.60mm/px · 3 of 163 slices shown]
[im 33/163  lung]
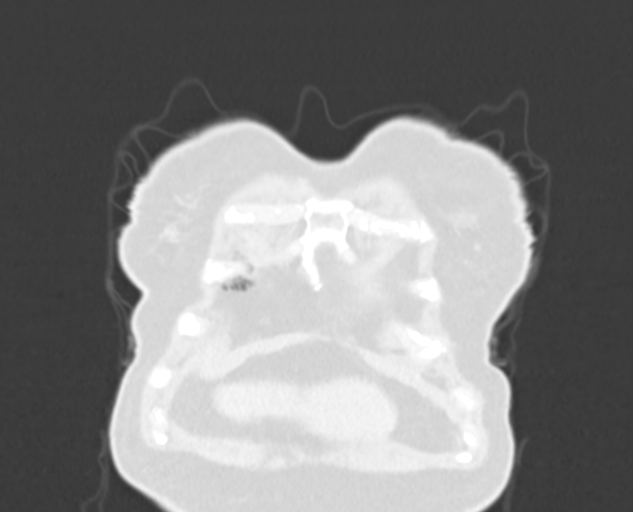
[im 65/163  lung]
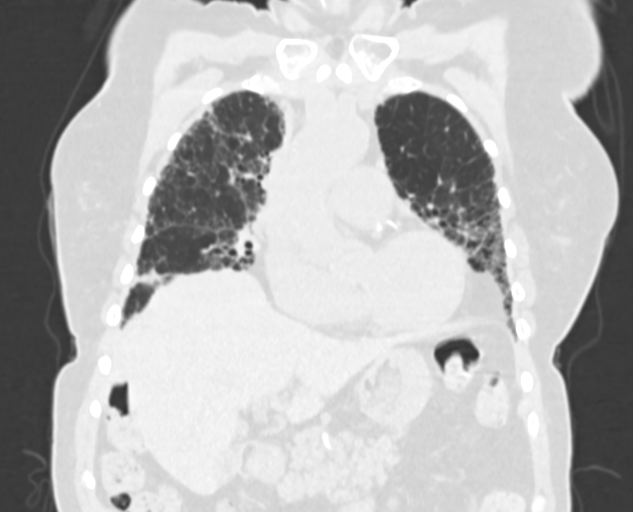
[im 98/163  lung]
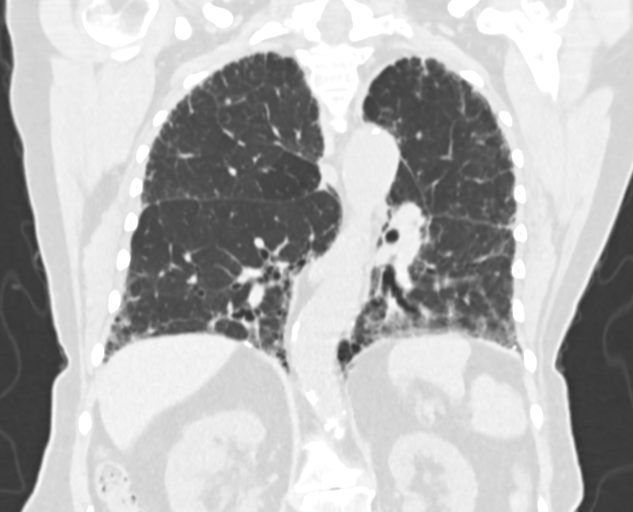

[Series 11: chest 1.00 br60 s3 high res thins 1x1 mm · axial · 0.63mm/px · z∈[+1469,+1707]mm · 10 of 286 slices shown, 13 images]
[im 24/286  mediastinal]
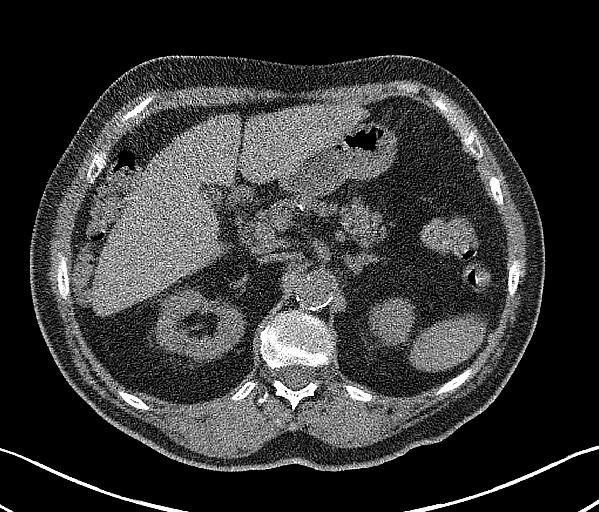
[im 24/286  lung]
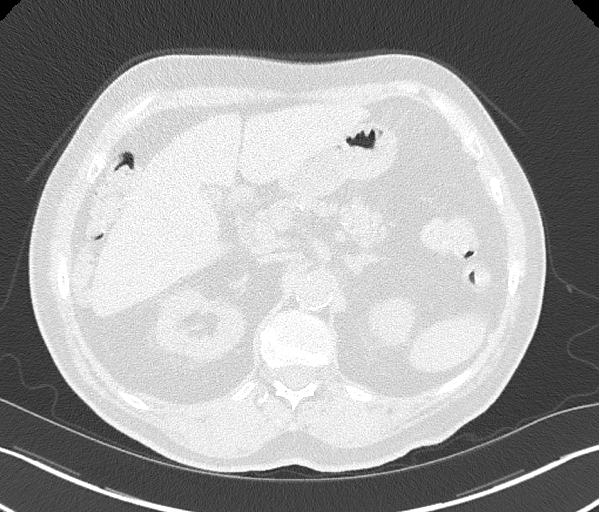
[im 48/286  lung]
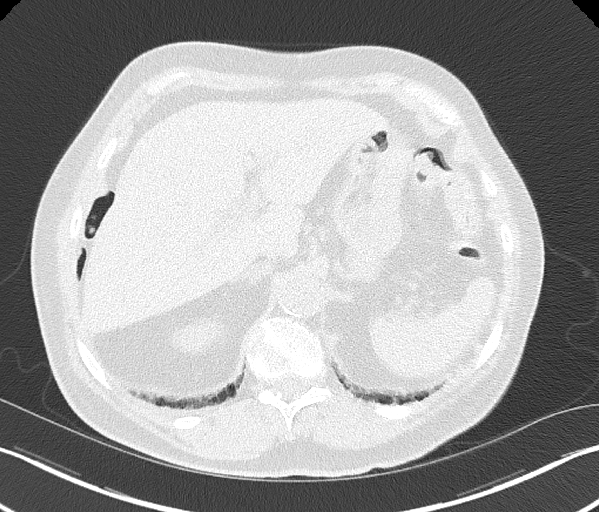
[im 72/286  lung]
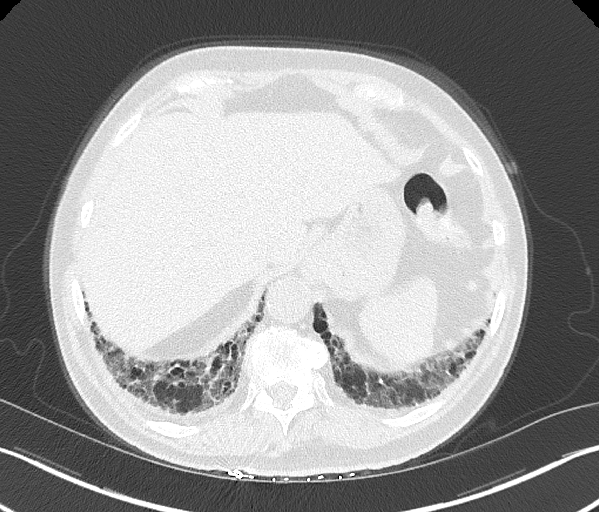
[im 96/286  lung]
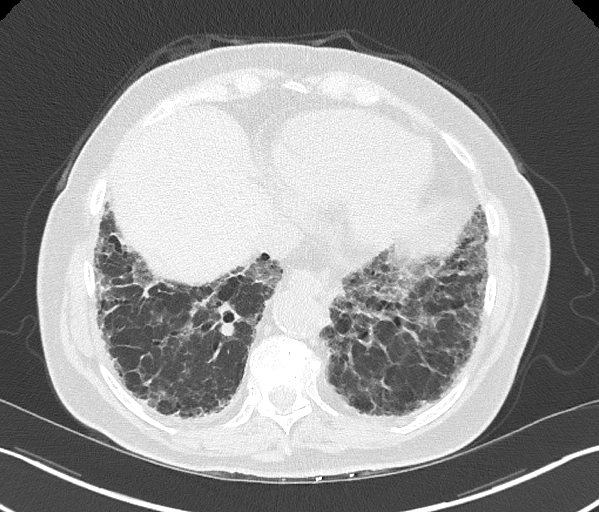
[im 119/286  mediastinal]
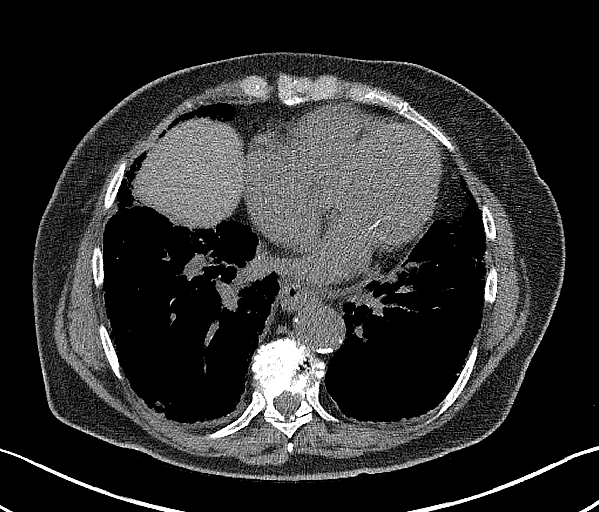
[im 119/286  lung]
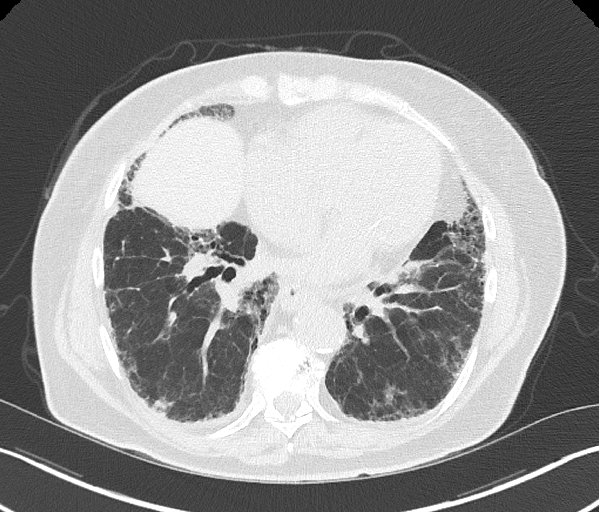
[im 167/286  lung]
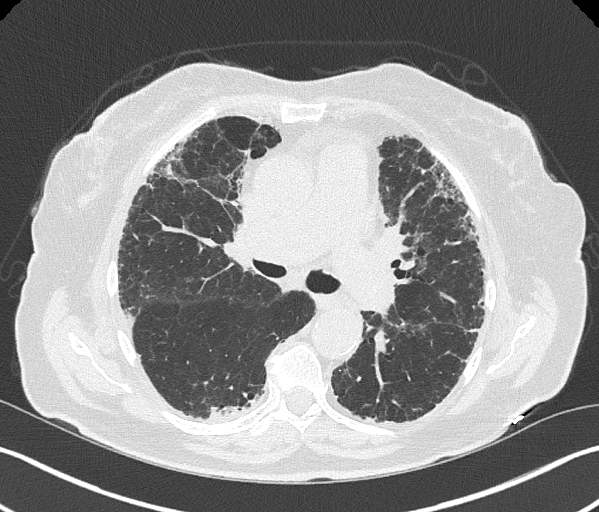
[im 191/286  lung]
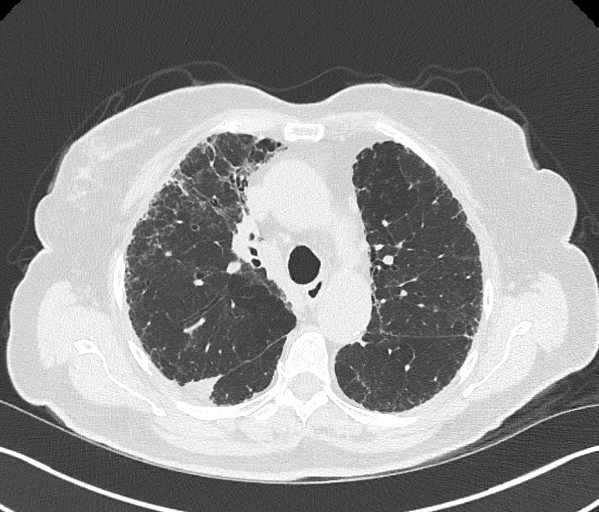
[im 214/286  lung]
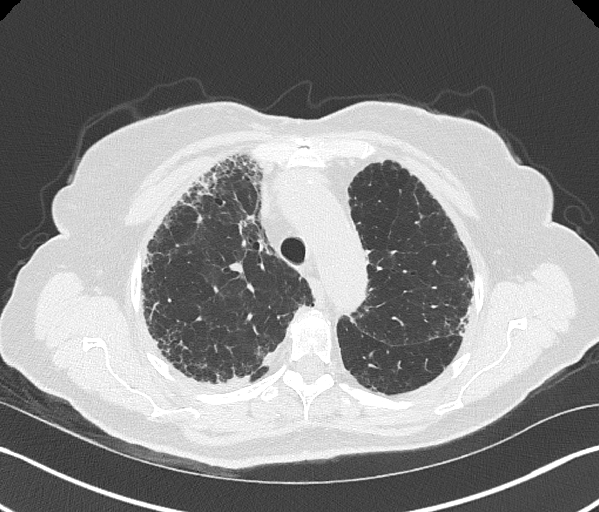
[im 238/286  mediastinal]
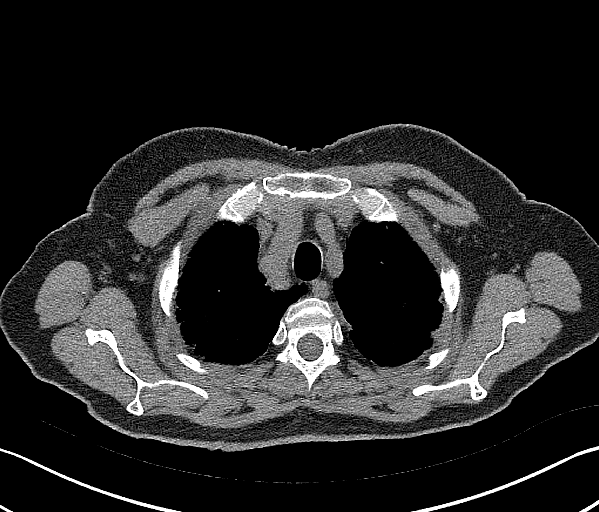
[im 238/286  lung]
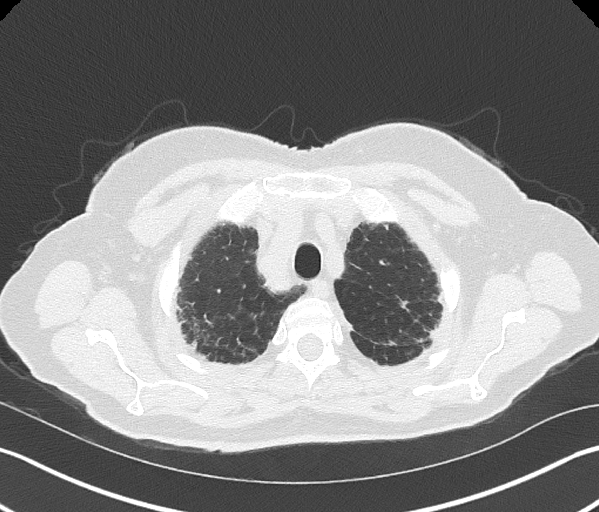
[im 262/286  lung]
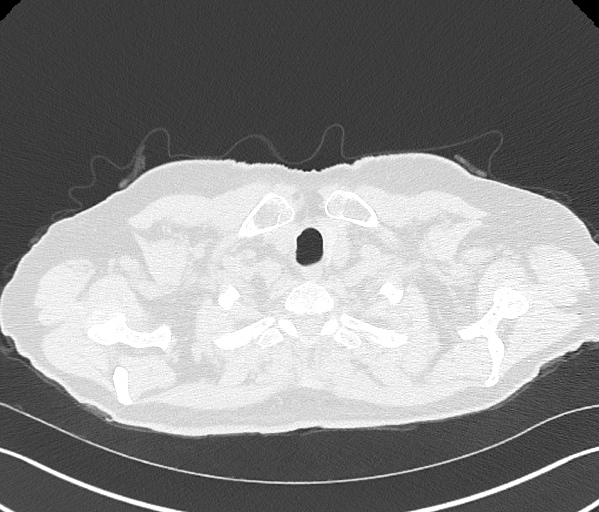

[13 of 36 positions shown; findings below may reference images not displayed]

FINDINGS: Cardiovascular: Aortic atherosclerosis. Normal heart size. Left
coronary artery calcifications. Enlargement of the main pulmonary
artery measuring up to 3.6 cm in caliber. No pericardial effusion.

Mediastinum/Nodes: No enlarged mediastinal, hilar, or axillary lymph
nodes. Thyroid gland, trachea, and esophagus demonstrate no
significant findings.

Lungs/Pleura: Unchanged, moderate pulmonary fibrosis in a pattern
with apical to basal gradient, featuring irregular peripheral
interstitial opacity, septal thickening, subpleural
bronchiolectasis, and evidence of honeycombing at the lung bases.
No pleural effusion or pneumothorax.

Upper Abdomen: No acute abnormality.

Musculoskeletal: No chest wall abnormality. No suspicious osseous
lesions identified.
IMPRESSION: 1. Unchanged, moderate pulmonary fibrosis in a pattern with apical
to basal gradient, featuring irregular peripheral interstitial
opacity, septal thickening, subpleural bronchiolectasis, and
evidence of honeycombing at the lung bases. Findings are in keeping
with diagnosis of IPF and consistent with UIP per consensus
guidelines: Diagnosis of Idiopathic Pulmonary Fibrosis: An Official
ATS/ERS/JRS/ALAT Clinical Practice Guideline. Am J Respir Crit Care
Med Vol 198, [place] 5, [id number][DATE].
2. Enlargement of the main pulmonary artery, as can be seen in
pulmonary hypertension.
3. Coronary artery disease.

Aortic Atherosclerosis ([id number]).

## 2023-11-15 ENCOUNTER — Telehealth: Payer: Self-pay | Admitting: Internal Medicine

## 2023-11-15 MED ORDER — FAMOTIDINE 20 MG PO TABS
20.0000 mg | ORAL_TABLET | Freq: Two times a day (BID) | ORAL | 0 refills | Status: DC | PRN
Start: 2023-11-15 — End: 2024-01-30

## 2023-11-15 NOTE — Telephone Encounter (Signed)
 Rx sent.

## 2023-11-15 NOTE — Telephone Encounter (Signed)
 Copied from CRM [id number]. Topic: Clinical - Medication Refill >> Nov 15, 2023  2:36 PM Gennette ORN wrote: Medication: famotidine  (PEPCID ) 20 MG tablet  Has the patient contacted their pharmacy? Yes (Agent: If no, request that the patient contact the pharmacy for the refill. If patient does not wish to contact the pharmacy document the reason why and proceed with request.) (Agent: If yes, when and what did the pharmacy advise?)  This is the patient's preferred pharmacy:  [facility] [address], [place] - [address] [place] [address] Phone: [phone] Fax: [phone]   Is this the correct pharmacy for this prescription? Yes If no, delete pharmacy and type the correct one.   Has the prescription been filled recently? Yes  Is the patient out of the medication? No  Has the patient been seen for an appointment in the last year OR does the patient have an upcoming appointment? Yes  Can we respond through MyChart? No  Agent: Please be advised that Rx refills may take up to 3 business days. We ask that you follow-up with your pharmacy.

## 2023-11-21 ENCOUNTER — Ambulatory Visit: Attending: General Practice | Admitting: Cardiology

## 2023-11-21 VITALS — BP 148/80 | HR 80 | Ht 64.0 in | Wt 155.9 lb

## 2023-11-21 DIAGNOSIS — I739 Peripheral vascular disease, unspecified: Secondary | ICD-10-CM

## 2023-11-21 DIAGNOSIS — E785 Hyperlipidemia, unspecified: Secondary | ICD-10-CM | POA: Diagnosis not present

## 2023-11-21 DIAGNOSIS — I10 Essential (primary) hypertension: Secondary | ICD-10-CM | POA: Diagnosis not present

## 2023-11-21 MED ORDER — CARVEDILOL 12.5 MG PO TABS: 12.5000 mg | ORAL_TABLET | Freq: Two times a day (BID) | ORAL | 3 refills | Status: DC

## 2023-11-21 NOTE — Progress Notes (Signed)
 Cardiology Office Note:  .   Date:  11/21/2023  ID:  Kayla Craig, DOB 1935-08-23, MRN [id number] PCP: Geofm Glade PARAS, MD  [facility] Providers Cardiologist:  Deatrice Cage, MD {  History of Present Illness: .   Kayla Craig is a 88 y.o. female with history of PAD, hypertension, type 2 diabetes, hyperlipidemia, GERD, osteoarthritis, pulmonary fibrosis followed by pulmonology     PAD Decreased ABIs 11/2019 0.78 on the right and 0.65 on the left. Duplex showed severe stenosis in the right popliteal artery with occluded posterior tibial artery. On the left, there was also severe stenosis in the popliteal artery extending into the TP trunk with occluded posterior tibial artery.   Syncope Heart monitor 04/2023 sinus rhythm.  5 episodes of VT, longest 5 beats.  70 episodes of SVT.  Longest lasting 1 minute 12 seconds.  PVC burden 2.1%.  Felt to be orthostatic.  Other Normal Lexiscan  10/2020. Echo 04/2021 preserved LVEF.  Mildly reduced RV.  Prominent LA septation?  Aortic sclerosis noted. Normal renal artery Dopplers 05/2022  Social history  One of her sons died many years ago.  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    Patient with history of PAD with severe popliteal artery stenosis bilaterally worse on the left side with extension into the TP trunk.  She has been asymptomatic so medical therapy recommended.  She was seen last 03/2023 and had orthostatic hypotension like symptoms.  2-week monitor ordered and overall benign.  From a PAD standpoint no significant changes.  Continued on medical therapy.  Today patient presents for follow-up.  Continues to not report any claudication symptoms.  She says that she does not have great mobility however she feels this is secondary to her hammertoe issues in which she sees podiatry, calluses and arthritic pains.  She has predominantly feet  and knee pain.  Also some vague left-sided thigh pain that she says is sore when she rubs it.  But no pain in her calf or exertional symptoms.  ROS: Denies: Chest pain, shortness of breath, orthopnea, peripheral edema, palpitations, decreased exercise intolerance, fatigue, lightheadedness.   Studies Reviewed: .     Risk Assessment/Calculations:       Physical Exam:   VS:  BP (!) 148/80   Pulse 80   Ht 5' 4 (1.626 m)   Wt 155 lb 14.4 oz (70.7 kg)   SpO2 95%   BMI 26.76 kg/m    Wt Readings from Last 3 Encounters:  11/21/23 155 lb 14.4 oz (70.7 kg)  10/09/23 153 lb (69.4 kg)  10/02/23 153 lb (69.4 kg)    GEN: Well nourished, well developed in no acute distress NECK: No JVD; No carotid bruits CARDIAC: RRR, no murmurs, rubs, gallops.  RESPIRATORY:  Clear to auscultation without rales, wheezing or rhonchi  ABDOMEN: Soft, non-tender, non-distended EXTREMITIES:  No edema; No deformity   ASSESSMENT AND PLAN: .    PAD Hyperlipidemia Severe popliteal artery stenosis bilaterally worse on the left side with extension into the TP trunk.  She has been asymptomatic so medical therapy recommended.  The pain that she describes otherwise is in her knees and feet. Sounds like its more related to her hammertoe and arthritic pain. Continue with aspirin, rosuvastatin  20 mg.  LDL 2 years ago was 69. Getting ABIs and lower extremity arterial Dopplers Repeat fasting lipid panel in 1 week  Hypertension -No evidence of renal artery stenosis 05/2022 Blood pressure 148/80 today. Continue with amlodipine  10 mg, increase carvedilol  from 6.25 mg to 12.5 mg twice daily, continue losartan  100 mg. She does not have a blood pressure cuff however she does reside in a facility where they can check this routinely.  I have asked her to lock her blood pressure and take this once to twice a day and bring us  report in 1 week when she gets her labs checked.  Orthostatic hypotension No longer an issue, has not had  any recurrences.  Had overall benign heart monitor 04/2023.  Had 70 episodes of SVT which she is asymptomatic from.  Up titration of beta-blocker should also help with this.  Type 2 diabetes A1c 6.2% 02/2023.  Well-controlled       Dispo: Follow-up with Dr. Darron in 2 to 4 months to go over vascular studies and follow-up on blood pressure. Also consider updating EKG next visit.  Signed, Thom LITTIE Sluder, PA-C

## 2023-11-21 NOTE — Patient Instructions (Addendum)
 Medication Instructions:  Your physician has recommended you make the following change in your medication:  INCREASE CARVEDILOL  TO 12.5 MG TWICE DAILY   *If you need a refill on your cardiac medications before your next appointment, please call your pharmacy*  Lab Work: TO BE DONE IN 1 WEEK: FASTING LIPIDS  If you have labs (blood work) drawn today and your tests are completely normal, you will receive your results only by: MyChart Message (if you have MyChart) OR A paper copy in the mail If you have any lab test that is abnormal or we need to change your treatment, we will call you to review the results.  Testing/Procedures: Your physician has requested that you have a lower extremity arterial duplex. This test is an ultrasound of the arteries in the legs or arms. It looks at arterial blood flow in the legs and arms. Allow one hour for Lower and Upper Arterial scans. There are no restrictions or special instructions.  Please note: We ask at that you not bring children with you during ultrasound (echo/ vascular) testing. Due to room size and safety concerns, children are not allowed in the ultrasound rooms during exams. Our front office staff cannot provide observation of children in our lobby area while testing is being conducted. An adult accompanying a patient to their appointment will only be allowed in the ultrasound room at the discretion of the ultrasound technician under special circumstances. We apologize for any inconvenience.    Your physician has requested that you have an ankle brachial index (ABI). During this test an ultrasound and blood pressure cuff are used to evaluate the arteries that supply the arms and legs with blood. Allow thirty minutes for this exam. There are no restrictions or special instructions.  Please note: We ask at that you not bring children with you during ultrasound (echo/ vascular) testing. Due to room size and safety concerns, children are not allowed in the  ultrasound rooms during exams. Our front office staff cannot provide observation of children in our lobby area while testing is being conducted. An adult accompanying a patient to their appointment will only be allowed in the ultrasound room at the discretion of the ultrasound technician under special circumstances. We apologize for any inconvenience.   Follow-Up: At [facility], you and your health needs are our priority.  As part of our continuing mission to provide you with exceptional heart care, our providers are all part of one team.  This team includes your primary Cardiologist (physician) and Advanced Practice Providers or APPs (Physician Assistants and Nurse Practitioners) who all work together to provide you with the care you need, when you need it.  Your next appointment:   2-4 month(s)  Provider:   Deatrice Cage, MD   We recommend signing up for the patient portal called MyChart.  Sign up information is provided on this After Visit Summary.  MyChart is used to connect with patients for Virtual Visits (Telemedicine).  Patients are able to view lab/test results, encounter notes, upcoming appointments, etc.  Non-urgent messages can be sent to your provider as well.   To learn more about what you can do with MyChart, go to [email or web address].   Other Instructions Please check your blood pressure 1-2 times per day for 1 week and bring readings when you come to get your labs and give them to  [facility], PA-C.

## 2023-11-28 DIAGNOSIS — I739 Peripheral vascular disease, unspecified: Secondary | ICD-10-CM | POA: Diagnosis not present

## 2023-11-28 LAB — LIPID PANEL

## 2023-11-29 ENCOUNTER — Ambulatory Visit: Payer: Self-pay | Admitting: Cardiology

## 2023-11-29 LAB — LIPID PANEL
Cholesterol, Total: 135 mg/dL (ref 100–199)
HDL: 60 mg/dL (ref >39)
LDL CALC COMMENT:: 2.3 ratio (ref 0.0–4.4)
LDL Chol Calc (NIH): 55 mg/dL (ref 0–99)
Triglycerides: 109 mg/dL (ref 0–149)
VLDL Cholesterol Cal: 20 mg/dL (ref 5–40)

## 2023-12-02 ENCOUNTER — Ambulatory Visit (HOSPITAL_BASED_OUTPATIENT_CLINIC_OR_DEPARTMENT_OTHER)
Admission: RE | Admit: 2023-12-02 | Discharge: 2023-12-02 | Disposition: A | Discharge status: Home / Self Care | Source: Ambulatory Visit | Attending: Cardiology

## 2023-12-02 ENCOUNTER — Ambulatory Visit (HOSPITAL_COMMUNITY)
Admission: RE | Admit: 2023-12-02 | Discharge: 2023-12-02 | Disposition: A | Discharge status: Home / Self Care | Source: Ambulatory Visit | Attending: Cardiology | Admitting: Cardiology

## 2023-12-02 DIAGNOSIS — I739 Peripheral vascular disease, unspecified: Secondary | ICD-10-CM | POA: Insufficient documentation

## 2023-12-02 LAB — VAS US ABI WITH/WO TBI
Left ABI: 0.68
Right ABI: 0.71

## 2023-12-16 ENCOUNTER — Telehealth: Payer: Self-pay

## 2023-12-16 NOTE — Telephone Encounter (Signed)
 Copied from CRM [id number]. Topic: General - Billing Inquiry >> Dec 13, 2023  2:59 PM Martinique E wrote: Reason for CRM: Patient stated she received a bill for $328 in regards to her AWV that was on 10/09/2023, patient did not know it was going to be that high just to answer health questions over the phone. Patient would like clarification on this amount. Callback number [phone].

## 2023-12-23 ENCOUNTER — Encounter: Payer: Self-pay | Admitting: Internal Medicine

## 2023-12-23 NOTE — Patient Instructions (Signed)
      Blood work was ordered.       Medications changes include :   start xyzal  at night.  Take omeprazole  40 mg daily 30 minutes prior to a meal     Return in about 6 months (around 06/22/2024) for Physical Exam.

## 2023-12-23 NOTE — Progress Notes (Unsigned)
 Subjective:    Patient ID: Kayla Craig, female    DOB: [birth year], 88 y.o.   MRN: [id number]     HPI [name] is here for follow up of her chronic medical problems.  Recently saw cardiology-PAD, hypertension, hyperlipidemia  No energy.  She probably sleeps about 6 hours at night.  She has to get up and go to the bathroom frequently and sometimes has difficulty getting back to sleep.  Back is hurting. Hip is ok.    Coughs all the time - rib soreness.    She is not exercising.    Medications and allergies reviewed with patient and updated if appropriate.  Current Outpatient Medications on File Prior to Visit  Medication Sig Dispense Refill   albuterol  (VENTOLIN  HFA) 108 (90 Base) MCG/ACT inhaler Inhale 2 puffs into the lungs every 6 (six) hours as needed (cough or shortness of breath). 8 g 1   amLODipine  (NORVASC ) 10 MG tablet Take 1 tablet by mouth once daily 90 tablet 0   aspirin 81 MG EC tablet Take 81 mg by mouth daily.     Calcium  Carbonate-Vit D-Min (CALCIUM  1200 PO) Take by mouth.     carvedilol  (COREG ) 12.5 MG tablet Take 1 tablet (12.5 mg total) by mouth 2 (two) times daily. (Patient taking differently: Take 12.5 mg by mouth 2 (two) times daily. Patient taking 6.5 mg due to swelling) 180 tablet 3   cholecalciferol (VITAMIN D3) 25 MCG (1000 UNIT) tablet Take 1,000 Units by mouth daily.     famotidine  (PEPCID ) 20 MG tablet Take 1 tablet (20 mg total) by mouth 2 (two) times daily as needed. 60 tablet 0   levothyroxine  (SYNTHROID ) 50 MCG tablet TAKE 1 TABLET BY MOUTH ONCE DAILY BEFORE BREAKFAST 90 tablet 0   losartan  (COZAAR ) 100 MG tablet Take 1 tablet (100 mg total) by mouth daily. 90 tablet 1   metFORMIN  (GLUCOPHAGE -XR) 500 MG 24 hr tablet Take 1 tablet by mouth once daily with breakfast 90 tablet 0   rosuvastatin  (CRESTOR ) 20 MG tablet Take 1 tablet (20 mg total) by mouth daily. 90 tablet 2   vitamin B-12 (CYANOCOBALAMIN ) 1000 MCG tablet Take 1,000 mcg by mouth  daily.     No current facility-administered medications on file prior to visit.     Review of Systems  Constitutional:  Positive for fatigue. Negative for fever.  HENT:  Positive for postnasal drip and trouble swallowing (occ).   Respiratory:  Positive for cough (dry cough- chronic). Negative for shortness of breath and wheezing.   Cardiovascular:  Negative for chest pain, palpitations and leg swelling.  Gastrointestinal:        No gerd  Musculoskeletal:  Positive for back pain.  Neurological:  Positive for headaches (rare). Negative for dizziness and light-headedness.  Psychiatric/Behavioral:  Positive for sleep disturbance (often 6 hrs or less - wakes frequently to urinate).        Objective:   Vitals:   12/24/23 1428  BP: 124/80  Pulse: 72  Temp: 98.6 F (37 C)  SpO2: 95%   BP Readings from Last 3 Encounters:  12/24/23 124/80  11/21/23 (!) 148/80  09/20/23 (!) 142/70   Wt Readings from Last 3 Encounters:  12/24/23 155 lb (70.3 kg)  11/21/23 155 lb 14.4 oz (70.7 kg)  10/09/23 153 lb (69.4 kg)   Body mass index is 26.61 kg/m.    Physical Exam Constitutional:      General: She is not in acute distress.  Appearance: Normal appearance.  HENT:     Head: Normocephalic and atraumatic.  Eyes:     Conjunctiva/sclera: Conjunctivae normal.  Cardiovascular:     Rate and Rhythm: Normal rate and regular rhythm.     Heart sounds: Normal heart sounds.  Pulmonary:     Effort: Pulmonary effort is normal. No respiratory distress.     Breath sounds: Normal breath sounds. No wheezing.  Musculoskeletal:     Cervical back: Neck supple.     Right lower leg: No edema.     Left lower leg: No edema.  Lymphadenopathy:     Cervical: No cervical adenopathy.  Skin:    General: Skin is warm and dry.     Findings: No rash.  Neurological:     Mental Status: She is alert. Mental status is at baseline.  Psychiatric:        Mood and Affect: Mood normal.        Behavior: Behavior  normal.        Lab Results  Component Value Date   WBC 8.5 02/22/2023   HGB 13.1 02/22/2023   HCT 40.3 02/22/2023   PLT 261.0 02/22/2023   GLUCOSE 94 02/22/2023   CHOL 135 11/28/2023   TRIG 109 11/28/2023   HDL 60 11/28/2023   LDLDIRECT 88.0 02/22/2023   LDLCALC 55 11/28/2023   ALT 12 02/22/2023   AST 17 02/22/2023   NA 139 02/22/2023   K 4.1 02/22/2023   CL 104 02/22/2023   CREATININE 1.01 02/22/2023   BUN 23 02/22/2023   CO2 28 02/22/2023   TSH 1.90 02/22/2023   HGBA1C 6.2 02/22/2023     Assessment & Plan:    See Problem List for Assessment and Plan of chronic medical problems.

## 2023-12-24 ENCOUNTER — Ambulatory Visit: Admitting: Internal Medicine

## 2023-12-24 VITALS — BP 124/80 | HR 72 | Temp 98.6°F | Ht 64.0 in | Wt 155.0 lb

## 2023-12-24 DIAGNOSIS — I152 Hypertension secondary to endocrine disorders: Secondary | ICD-10-CM | POA: Diagnosis not present

## 2023-12-24 DIAGNOSIS — E1159 Type 2 diabetes mellitus with other circulatory complications: Secondary | ICD-10-CM | POA: Diagnosis not present

## 2023-12-24 DIAGNOSIS — E785 Hyperlipidemia, unspecified: Secondary | ICD-10-CM | POA: Diagnosis not present

## 2023-12-24 DIAGNOSIS — E1169 Type 2 diabetes mellitus with other specified complication: Secondary | ICD-10-CM

## 2023-12-24 DIAGNOSIS — Z7984 Long term (current) use of oral hypoglycemic drugs: Secondary | ICD-10-CM

## 2023-12-24 DIAGNOSIS — E039 Hypothyroidism, unspecified: Secondary | ICD-10-CM | POA: Diagnosis not present

## 2023-12-24 DIAGNOSIS — R053 Chronic cough: Secondary | ICD-10-CM | POA: Diagnosis not present

## 2023-12-24 DIAGNOSIS — M810 Age-related osteoporosis without current pathological fracture: Secondary | ICD-10-CM

## 2023-12-24 DIAGNOSIS — K219 Gastro-esophageal reflux disease without esophagitis: Secondary | ICD-10-CM | POA: Diagnosis not present

## 2023-12-24 DIAGNOSIS — E538 Deficiency of other specified B group vitamins: Secondary | ICD-10-CM

## 2023-12-24 DIAGNOSIS — E118 Type 2 diabetes mellitus with unspecified complications: Secondary | ICD-10-CM

## 2023-12-24 DIAGNOSIS — E559 Vitamin D deficiency, unspecified: Secondary | ICD-10-CM

## 2023-12-24 DIAGNOSIS — J84112 Idiopathic pulmonary fibrosis: Secondary | ICD-10-CM | POA: Diagnosis not present

## 2023-12-24 DIAGNOSIS — R5383 Other fatigue: Secondary | ICD-10-CM

## 2023-12-24 MED ORDER — LEVOCETIRIZINE DIHYDROCHLORIDE 5 MG PO TABS: 5.0000 mg | ORAL_TABLET | Freq: Every evening | ORAL | 5 refills | Status: AC

## 2023-12-24 MED ORDER — OMEPRAZOLE 40 MG PO CPDR: 40.0000 mg | DELAYED_RELEASE_CAPSULE | Freq: Every day | ORAL | 3 refills | Status: AC

## 2023-12-24 NOTE — Assessment & Plan Note (Signed)
 Chronic ?Check B12 level ?

## 2023-12-24 NOTE — Assessment & Plan Note (Signed)
 Chronic Possible causes include PND, GERD and pulmonary fibrosis She does state PND-start Xyzal  5 mg nightly I do not think her GERD is controlled with her famotidine  20 mg twice daily as needed-continue that, but add omeprazole  40 mg daily Sees pulmonary next month

## 2023-12-24 NOTE — Assessment & Plan Note (Addendum)
 Chronic To establish with a new pulmonologist Has a chronic cough which is dry in nature-I do think this cough is probably multifactorial-PND and GERD likely contributing

## 2023-12-24 NOTE — Assessment & Plan Note (Addendum)
 Chronic DEXA up-to-date History of fractures (hands, feet)-related to falls DEXA with osteopenia with high FRAX Continue calcium  and vitamin D  daily I think her elevated FRAX is likely related to her history of fractures which were mild and related to falls Will hold off on medication for right now

## 2023-12-24 NOTE — Assessment & Plan Note (Signed)
 Chronic  Clinically euthyroid Check tsh and will titrate med dose if needed Currently taking levothyroxine  50 mcg daily

## 2023-12-24 NOTE — Assessment & Plan Note (Signed)
 Chronic Regular exercise and healthy diet encouraged Check lipid panel, CMP Continue Crestor 20 mg daily

## 2023-12-24 NOTE — Assessment & Plan Note (Signed)
 Chronic Associated with hyperlipidemia  Lab Results  Component Value Date   HGBA1C 6.2 02/22/2023   Sugars well controlled Check A1c  Continue metformin  XR 500 mg daily with breakfast Stressed diabetic diet

## 2023-12-24 NOTE — Assessment & Plan Note (Signed)
 Chronic Taking vitamin D daily Check vitamin D level

## 2023-12-24 NOTE — Assessment & Plan Note (Signed)
 Chronic She is not getting good sleep and her quality of sleep is not good which is likely contributing Blood work today to rule out other causes Encouraged regular exercise which would likely improve her energy level

## 2023-12-24 NOTE — Assessment & Plan Note (Addendum)
 Chronic Blood pressure is controlled CMP, CBC Continue amlodipine  10 mg daily, Coreg  6.25 mg twice daily ( not taking 12.5 mg ), losartan  100 mg daily

## 2023-12-24 NOTE — Assessment & Plan Note (Addendum)
 Chronic She states she has occasional GERD, but I do think she is having more than she realizes and some of her cough is related to reflux Continue famotidine  20 mg twice daily as needed Start omeprazole  40 mg daily 30 minutes prior to meal

## 2023-12-25 ENCOUNTER — Other Ambulatory Visit

## 2023-12-25 LAB — COMPREHENSIVE METABOLIC PANEL WITH GFR
ALT: 12 U/L (ref 0–35)
AST: 15 U/L (ref 0–37)
Albumin: 4.3 g/dL (ref 3.5–5.2)
Alkaline Phosphatase: 37 U/L — ABNORMAL LOW (ref 39–117)
BUN: 27 mg/dL — ABNORMAL HIGH (ref 6–23)
CO2: 22 meq/L (ref 19–32)
Calcium: 9.6 mg/dL (ref 8.4–10.5)
Chloride: 103 meq/L (ref 96–112)
Creatinine, Ser: 1.47 mg/dL — ABNORMAL HIGH (ref 0.40–1.20)
GFR: 32.2 mL/min — ABNORMAL LOW (ref >60.00)
Glucose, Bld: 144 mg/dL — ABNORMAL HIGH (ref 70–99)
Potassium: 4.4 meq/L (ref 3.5–5.1)
Sodium: 137 meq/L (ref 135–145)
Total Bilirubin: 0.5 mg/dL (ref 0.2–1.2)
Total Protein: 7.5 g/dL (ref 6.0–8.3)

## 2023-12-25 LAB — CBC
HCT: 37 % (ref 36.0–46.0)
Hemoglobin: 12.2 g/dL (ref 12.0–15.0)
MCHC: 32.9 g/dL (ref 30.0–36.0)
MCV: 84.6 fl (ref 78.0–100.0)
Platelets: 271 K/uL (ref 150.0–400.0)
RBC: 4.37 Mil/uL (ref 3.87–5.11)
RDW: 14.1 % (ref 11.5–15.5)
WBC: 8.3 K/uL (ref 4.0–10.5)

## 2023-12-25 LAB — HEMOGLOBIN A1C: Hgb A1c MFr Bld: 6.5 % (ref 4.6–6.5)

## 2023-12-25 LAB — TSH: TSH: 2.26 u[IU]/mL (ref 0.35–5.50)

## 2023-12-25 LAB — VITAMIN B12: Vitamin B-12: 710 pg/mL (ref 211–911)

## 2023-12-25 LAB — VITAMIN D 25 HYDROXY (VIT D DEFICIENCY, FRACTURES): VITD: 45.33 ng/mL (ref 30.00–100.00)

## 2023-12-26 LAB — MICROALBUMIN / CREATININE URINE RATIO
Creatinine,U: 139.1 mg/dL
Microalb Creat Ratio: 50.2 mg/g — ABNORMAL HIGH (ref 0.0–30.0)
Microalb, Ur: 7 mg/dL — ABNORMAL HIGH (ref 0.0–1.9)

## 2023-12-28 ENCOUNTER — Ambulatory Visit: Payer: Self-pay | Admitting: Internal Medicine

## 2023-12-28 DIAGNOSIS — E118 Type 2 diabetes mellitus with unspecified complications: Secondary | ICD-10-CM

## 2023-12-28 DIAGNOSIS — I152 Hypertension secondary to endocrine disorders: Secondary | ICD-10-CM

## 2023-12-30 ENCOUNTER — Encounter: Admitting: Pulmonary Disease

## 2024-01-02 ENCOUNTER — Telehealth: Payer: Self-pay

## 2024-01-02 ENCOUNTER — Other Ambulatory Visit: Payer: Self-pay | Admitting: Internal Medicine

## 2024-01-02 NOTE — Telephone Encounter (Signed)
 Copied from CRM #[id number]. Topic: Clinical - Lab/Test Results >> Jan 02, 2024  3:21 PM Thersia BROCKS wrote: Reason for CRM: Patient called in regarding a missed call, relay lab results to patient, patient stated she would also like that sent to her home address, patient was scheduled for a lab appointment for 3 weeks out, patient stated she understood and had no further questions

## 2024-01-03 NOTE — Telephone Encounter (Signed)
 Mailed out today

## 2024-01-21 ENCOUNTER — Encounter: Payer: Self-pay | Admitting: Internal Medicine

## 2024-01-21 ENCOUNTER — Other Ambulatory Visit (INDEPENDENT_AMBULATORY_CARE_PROVIDER_SITE_OTHER)

## 2024-01-21 ENCOUNTER — Ambulatory Visit: Admitting: Internal Medicine

## 2024-01-21 VITALS — BP 124/72 | HR 73 | Temp 98.3°F | Ht 64.0 in | Wt 154.6 lb

## 2024-01-21 DIAGNOSIS — E118 Type 2 diabetes mellitus with unspecified complications: Secondary | ICD-10-CM

## 2024-01-21 DIAGNOSIS — R7689 Other specified abnormal immunological findings in serum: Secondary | ICD-10-CM

## 2024-01-21 DIAGNOSIS — I152 Hypertension secondary to endocrine disorders: Secondary | ICD-10-CM | POA: Diagnosis not present

## 2024-01-21 DIAGNOSIS — R053 Chronic cough: Secondary | ICD-10-CM | POA: Diagnosis not present

## 2024-01-21 DIAGNOSIS — R0609 Other forms of dyspnea: Secondary | ICD-10-CM

## 2024-01-21 DIAGNOSIS — R911 Solitary pulmonary nodule: Secondary | ICD-10-CM | POA: Diagnosis not present

## 2024-01-21 DIAGNOSIS — J84112 Idiopathic pulmonary fibrosis: Secondary | ICD-10-CM

## 2024-01-21 DIAGNOSIS — I288 Other diseases of pulmonary vessels: Secondary | ICD-10-CM

## 2024-01-21 DIAGNOSIS — E1159 Type 2 diabetes mellitus with other circulatory complications: Secondary | ICD-10-CM

## 2024-01-21 LAB — BASIC METABOLIC PANEL WITH GFR
BUN: 20 mg/dL (ref 6–23)
CO2: 25 meq/L (ref 19–32)
Calcium: 9.6 mg/dL (ref 8.4–10.5)
Chloride: 102 meq/L (ref 96–112)
Creatinine, Ser: 1.22 mg/dL — ABNORMAL HIGH (ref 0.40–1.20)
GFR: 40.25 mL/min — ABNORMAL LOW (ref >60.00)
Glucose, Bld: 145 mg/dL — ABNORMAL HIGH (ref 70–99)
Potassium: 4.4 meq/L (ref 3.5–5.1)
Sodium: 136 meq/L (ref 135–145)

## 2024-01-21 LAB — NITRIC OXIDE: Nitric Oxide: 13

## 2024-01-21 NOTE — Progress Notes (Signed)
 [name] with history of hypertension, diabetes, hyperlipidemia, allergies, GERD on PPI, peripheral artery disease She has a dry nonproductive cough since March 2022.  No associated dyspnea fevers or chills.  Given Z-Pak by primary care in July 2022 Chest x-ray and follow-up CT showed interstitial lung disease and she has been referred to pulmonary for further evaluation  Has occasional joint pain.  No morning stiffness, difficulty swallowing, denies dry mouth, dry eyes  She has been evaluated by Dr. Jeannetta, rheumatology in 01/30/2021 for elevated ANA.  He did not feel that she had any connective tissue disease  Pets: No pets Occupation: Used to work in a [employer] Exposures: Was exposed to acetone while working.  No ongoing exposures.  No mold, hot tub, Jacuzzi.  No feather pillows or comforters Smoking history: Never smoker Travel history: No significant travel history Relevant family history: No family history of lung disease  Interim history: Diagnosis of IPF given after MDC in sept 2022. Started on Esbriet  in October 2022.  She took it for about a month and stopped due to weakness, fatigue, loss of weight and appetite.  She reports a couple of falls at home with syncope.    Now on Ofev  since March 2023. She is tolerating this med better. Dyspnea on exertion is stable.       History of Present Illness  09/13/2021  Pulmonary/ 1st office eval/Wert OFEV  x sev weeks but stopped Aug 14 2021 UGI intol   Chief Complaint  Patient presents with   Follow-up    Pt states she has been doing okay since last visit. States after being placed on OFEV  by Dr. Theophilus, she felt like it took a toll on her body.  Dyspnea:  no regular walking/ holds on to buggy since covid shots  Cough: better now present p supper on famotidine  40 mg one with breakfast   Sleep: one pillow  SABA use: none  Rec Pantoprazole  (protonix ) 40 mg  Take  30-60 min before first meal of the day and Pepcid   (famotidine )  40 mg after supper until return to office  GERD diet  Please schedule a follow up office visit in 6 weeks, call sooner if needed      02/20/2022  f/u ov/Wert re: UIP declines antifibrotics  maint on pepcid  bid pc   Chief Complaint  Patient presents with   Follow-up    Breathing is overall doing well. She has occ cough- dry cough.    Dyspnea:  limited by feet and legs not sob/ not checking sats with exertion as rec  Cough: sporadic - daytime and not on insp or with exertion  Sleeping: bed is flat 2 pillows SABA use: none  02: none  Rec Make sure you check your oxygen saturation at your highest level of activity to be sure it stays over 90%     08/21/2022  f/u ov/Wert re: UIP maint on pepcid  20 mg   Chief Complaint  Patient presents with   Follow-up    Area of pain on R mid-back area.  Cough persistent  R Chest Pain came on suddenly  and stayed s radicular features localized to lower R chest s rash, present 24/7 but does not awaken her or worsen with cough, position, breathing,exertion Dyspnea:  not slowed down by doe   Cough: twice daily sporadically, says not much of a problem though coughed hard several times during ov  Sleeping: bed is flat/ 2 pillows  SABA use: not using  02: none  Occ hb on prn pepcid , can't take ppi made her light headed             OV 01/21/2024  Subjective:  Patient ID: Kayla Craig Kayla Craig, female , DOB: [birth year] , age 18 y.o. , MRN: [id number] , ADDRESS: [address] [place] [id number] PCP Geofm Glade PARAS, MD Patient Care Team: Geofm Glade PARAS, MD as PCP - General (Internal Medicine) Darron Deatrice DELENA, MD as PCP - Cardiology (Cardiology) Camillo Golas, MD (Ophthalmology) Ernie Cough, MD (Orthopedic Surgery) Szabat, Toribio BROCKS, [facility] (Inactive) as Pharmacist (Pharmacist)  This Provider for this visit: Treatment Team:  Attending Provider: Geronimo Amel, MD    01/21/2024 -   Chief Complaint  Patient presents with    Interstitial Lung Disease    Pt states she coughs all the time Prod (phlegm clear)  SOB occurs when doing house work  Pt states during the day she has no energy, when eating pt coughs Pt also stated she is congested all the time       HPI Kayla Craig Kayla Craig 88 y.o. -returns for follow-up..  Is a transfer of care from Dr. Ozell Grays.  She had originally seen Dr. Theophilus 2-1/2 years ago.  After case conference discussion was diagnosed with IPF.  Initially started on pirfenidone  and then lost significant amount of weight.  She says she went from 180 pounds 132 pounds.  And then quit taking it.  Then per record review went on nintedanib and this increased liver function test and the drugs were withheld.  At this point in she switched care to Dr. Ozell America who followed expectantly.  Last seen towards end of 2023.  She is here because her cough is severe.  She has been getting worse for the last 1 year.  Present 24 hours a day.  Does not wake her up at night but as soon as she gets up she starts coughing.  If she talks she coughs more.  She is drinking water all the time to control her cough.  She also gags.  Cough is her main symptom requiring relief.  She also has worsening shortness of breath that is getting worse over time.  But definitely cough is the biggest quality-of-life issue.  Of note - CT scan showed enlarged pulmonary artery.  Previous echo showed elevated pulmonary pressures she has upcoming appointment Dr. Sherrod Shackle her cardiologist.  This on February 11, 2024  -There is also a 9 mm pulmonary nodule in the previous CT.  She has not followed up for this.  She does not seem fully aware of this.  Social - She lives alone in a senior apartment in [place].   SYMPTOM SCALE - ILD 01/21/2024  Current weight   O2 use ra  Shortness of Breath 0 -> 5 scale with 5 being worst (score 6 If unable to do)  At rest 3  Simple tasks - showers, clothes change, eating, shaving 3  Household  (dishes, doing bed, laundry) 3  Shopping 4  Walking level at own pace 4  Walking up Stairs 4  Total (30-36) Dyspnea Score 21  How bad is your cough? bad  How bad is your fatigue ok  How bad is appetiee ok  How bad is nausea ok  How bad is vomiting?  ok  How bad is diarrhea? none  How bad is anxiety? ok  How bad is depression ok  Any chronic pain - if so where and how  bad x     CT Chest data from date: APril 2024  - personally visualized and independently interpreted : yes - my findings are: definiee UIP sclerotic lesions.   IMPRESSION: 1. No definite CT correlate for the questioned abnormality on recent chest radiograph. 2. New 9 mm subpleural posterior right lower lobe nodule, borderline in size for PET. Recommend follow-up CT chest without contrast in 3 months in further evaluation as malignancy cannot be excluded. 3. Pulmonary fibrosis appears slightly progressive. Findings are consistent with UIP per consensus guidelines: Diagnosis of Idiopathic Pulmonary Fibrosis: An Official ATS/ERS/JRS/ALAT Clinical Practice Guideline. Am JINNY [employer] Crit Care Med Vol 198, Iss 5, [phone], Dec 08 2016. 4. Trace bilateral pleural effusions. 5. Aortic atherosclerosis (ICD10-I70.0). Coronary artery calcification. ulmonary arterial hypertension.     Electronically Signed   By: Newell Eke M.D.   On: 07/11/2022 09:19  PFT     Latest Ref Rng & Units 01/27/2021    1:36 PM  PFT Results  FVC-Pre L 1.73   FVC-Predicted Pre % 69   FVC-Post L 1.55   FVC-Predicted Post % 62   Pre FEV1/FVC % % 88   Post FEV1/FCV % % 89   FEV1-Pre L 1.52   FEV1-Predicted Pre % 82   FEV1-Post L 1.38   DLCO uncorrected ml/min/mmHg 10.13   DLCO UNC% % 54   DLVA Predicted % 82   TLC L 2.99   TLC % Predicted % 59   RV % Predicted % 45    NORML sress test 2022   Allergies  Allergen Reactions   Codeine  Nausea And Vomiting    Dizzy   Esbriet  [Pirfenidone ]    Nexletol [Bempedoic Acid]      Nausea and weakness    LAB RESULTS last 96 hours No results found.   Latest Reference Range & Units 12/06/20 11:34  Anti Nuclear Antibody (ANA) NEGATIVE  POSITIVE !  ANA Pattern 1  Nuclear, Speckled !  ANA Titer 1 titer 1:1,280 (H)  Cyclic Citrullin Peptide Ab UNITS <16  RA Latex Turbid. <14 IU/mL <14  ANA,IFA RA Diag Pnl w/rflx Tit/Patn  Rpt !  Anti-Jo-1 Ab (RDL) <20 Units <20  Anti-PL-7 Ab (RDL) Negative  Weak Positive !  Anti-PL-12 Ab (RDL) Negative  Negative  Anti-EJ Ab (RDL) Negative  Negative  Anti-OJ Ab (RDL) Negative  Negative  Anti-SRP Ab (RDL) Negative  Negative  Anti-Mi-2 Ab (RDL) Negative  Negative  Anti-TIF-1gamma Ab (RDL) <20 Units <20  Anti-MDA-5 Ab (CADM-140)(RDL) <20 Units <20  Anti-NXP-2 (P140) Ab (RDL) <20 Units <20  Anti-SAE1 Ab, IgG (RDL) <20 Units <20  Anti-PM/Scl-100 Ab (RDL) <20 Units <20  Anti-Ku Ab (RDL) Negative  Negative  Anti-SS-A 52kD Ab, IgG (RDL) <20 Units <20  Anti-U1 RNP Ab (RDL) <20 Units <20  Anti-U2 RNP Ab (RDL) Negative  Negative  Anti-U3 RNP (Fibrillarin)(RDL) Negative  Negative  SSA (Ro) (ENA) Antibody, IgG <1.0 NEG AI <1.0 NEG  SSB (La) (ENA) Antibody, IgG <1.0 NEG AI <1.0 NEG  Scleroderma (Scl-70) (ENA) Antibody, IgG <1.0 NEG AI <1.0 NEG  !: Data is abnormal (H): Data is abnormally high Rpt: View report in Results Review for more information   Latest Reference Range & Units 07/22/06 11:30 05/12/07 12:10 11/07/07 10:03 03/25/08 12:17 10/20/09 15:59 05/08/12 14:34 12/26/12 22:36 12/27/12 04:30 01/11/14 13:46 12/14/14 09:32 01/10/16 10:22 01/28/17 12:28 02/12/18 10:48 05/13/18 10:39 02/17/19 13:25 02/26/20 14:17 12/08/20 15:02 03/06/21 14:47 05/30/21 10:41 07/28/21 14:02 08/28/21 14:12 03/06/22 15:08 09/21/22 15:29 11/19/22  05:25 02/22/23 14:46 12/25/23 16:01 01/21/24 10:26  Creatinine 0.40 - 1.20 mg/dL 0.7 0.9 9.06 0.8 0.9 0.8 0.80 0.68 0.9 1.00 1.03 0.94 1.05 0.89 0.88 1.11 1.19 1.14 0.96 0.90 1.03 1.05 1.08 0.90 1.01 1.47 (H) 1.22 (H)   (H): Data is abnormally high   has a past medical history of B12 deficiency, Back pain, Goiter, Grief, Hypertension, TMJ (dislocation of temporomandibular joint), and Vitamin D  deficiency.   reports that she has never smoked. She has been exposed to tobacco smoke. She has never used smokeless tobacco.  Past Surgical History:  Procedure Laterality Date   EYE SURGERY  2010   cataract surgery with implants   KNEE SURGERY     Right Arthroscopy '05 Dr Ernie   THYROIDECTOMY     August '09 for goiter (Dr Roark)   TUBAL LIGATION      Allergies  Allergen Reactions   Codeine  Nausea And Vomiting    Dizzy   Esbriet  [Pirfenidone ]    Nexletol [Bempedoic Acid]     Nausea and weakness    Immunization History  Administered Date(s) Administered   Fluad Quad(high Dose 65+) 12/12/2018   INFLUENZA, HIGH DOSE SEASONAL PF 01/06/2018, 12/12/2018, 12/03/2020, 01/09/2022, 12/19/2022, 01/17/2024   Influenza Whole 01/31/2009, 02/28/2012   Influenza,inj,Quad PF,6+ Mos 01/11/2014   Influenza-Unspecified 01/29/2013, 12/22/2015, 12/08/2016, 01/09/2020   Moderna Sars-Covid-2 Vaccination 06/17/2019, 07/15/2019, 03/16/2020   Pneumococcal Conjugate-13 12/14/2014   Pneumococcal Polysaccharide-23 05/08/2012   Tetanus 05/08/2012    Family History  Problem Relation Age of Onset   Cancer Mother    Heart disease Father        CAD/MI   Heart disease Brother        CAD/MI   Heart disease Brother        CAD/MI   Cancer Brother    Diabetes Neg Hx      Current Outpatient Medications:    amLODipine  (NORVASC ) 10 MG tablet, Take 1 tablet by mouth once daily, Disp: 90 tablet, Rfl: 0   aspirin 81 MG EC tablet, Take 81 mg by mouth daily., Disp: , Rfl:    Calcium  Carbonate-Vit D-Min (CALCIUM  1200 PO), Take by mouth., Disp: , Rfl:    carvedilol  (COREG ) 12.5 MG tablet, Take 1 tablet (12.5 mg total) by mouth 2 (two) times daily., Disp: 180 tablet, Rfl: 3   cholecalciferol (VITAMIN D3) 25 MCG (1000 UNIT) tablet,  Take 1,000 Units by mouth daily., Disp: , Rfl:    famotidine  (PEPCID ) 20 MG tablet, Take 1 tablet (20 mg total) by mouth 2 (two) times daily as needed., Disp: 60 tablet, Rfl: 0   levocetirizine (XYZAL ) 5 MG tablet, Take 1 tablet (5 mg total) by mouth every evening., Disp: 30 tablet, Rfl: 5   levothyroxine  (SYNTHROID ) 50 MCG tablet, TAKE 1 TABLET BY MOUTH ONCE DAILY BEFORE BREAKFAST, Disp: 90 tablet, Rfl: 0   losartan  (COZAAR ) 100 MG tablet, Take 1 tablet (100 mg total) by mouth daily., Disp: 90 tablet, Rfl: 1   metFORMIN  (GLUCOPHAGE -XR) 500 MG 24 hr tablet, Take 1 tablet by mouth once daily with breakfast, Disp: 90 tablet, Rfl: 0   rosuvastatin  (CRESTOR ) 20 MG tablet, Take 1 tablet (20 mg total) by mouth daily., Disp: 90 tablet, Rfl: 2   vitamin B-12 (CYANOCOBALAMIN ) 1000 MCG tablet, Take 1,000 mcg by mouth daily., Disp: , Rfl:    albuterol  (VENTOLIN  HFA) 108 (90 Base) MCG/ACT inhaler, Inhale 2 puffs into the lungs every 6 (six) hours as needed (cough or shortness of breath). (Patient  not taking: Reported on 01/21/2024), Disp: 8 g, Rfl: 1   omeprazole  (PRILOSEC) 40 MG capsule, Take 1 capsule (40 mg total) by mouth daily. Take 30 minutes prior to a meal (Patient not taking: Reported on 01/21/2024), Disp: 90 capsule, Rfl: 3      Objective:   Vitals:   01/21/24 1601  BP: 124/72  Pulse: 73  Temp: 98.3 F (36.8 C)  TempSrc: Oral  SpO2: 94%  Weight: 154 lb 9.6 oz (70.1 kg)  Height: 5' 4 (1.626 m)    Estimated body mass index is 26.54 kg/m as calculated from the following:   Height as of this encounter: 5' 4 (1.626 m).   Weight as of this encounter: 154 lb 9.6 oz (70.1 kg).  @WEIGHTCHANGE @  Filed Weights   01/21/24 1601  Weight: 154 lb 9.6 oz (70.1 kg)     Physical Exam   General: No distress. Looks well O2 at rest: no Cane present: no Sitting in wheel chair: no Frail: no Obese: no Neuro: Alert and Oriented x 3. GCS 15. Speech normal Psych: Pleasant Resp:  Barrel Chest -  no.  Wheeze - no, Crackles - YES BASE, No overt respiratory distress CVS: Normal heart sounds. Murmurs - no Ext: Stigmata of Connective Tissue Disease - no HEENT: Normal upper airway. PEERL +. No post nasal drip        Assessment/     Assessment & Plan IPF (idiopathic pulmonary fibrosis) (HCC)  Positive ANA (antinuclear antibody)  Enlarged pulmonary artery (HCC)  DOE (dyspnea on exertion)  Right lower lobe pulmonary nodule  Chronic cough    PLAN Patient Instructions     ICD-10-CM   1. IPF (idiopathic pulmonary fibrosis) (HCC)  J84.112     2. Positive ANA (antinuclear antibody)  R76.89     3. Enlarged pulmonary artery (HCC)  I28.8     4. DOE (dyspnea on exertion)  R06.09     5. Right lower lobe pulmonary nodule  R91.1     6. Chronic cough  R05.3 Nitric oxide     Shortness of breath Chronic cough   - Exhaled nitric oxide test for asthma was normal  Plan - Do blood allergy workup -Capture other information with CT scan and blood the blood test as described above -    IPF (idiopathic pulmonary fibrosis) (HCC)  ANA positive  Prior intolerance to pirfenidone  with weight loss and elevated liver function test with nintedanib Need to assess current status  Plan - Do spirometry and DLCO - Do overnight pulse oximetry on room air - Do repeat high-resolution CT chest [last 1 was 18 months ago] -Repeat ANA, ESR, CCP, rheumatoid factor and QuantiFERON gold  - If ANA still significantly elevated refer rheumatology - Based on the above results discussed treatment option with Nerandomilast versus clinical trial with IV medication  #Enlarged pulmonary artery o on CT scan of the chest  Plan - Do echocardiogram please do this before February 11, 2024 cardiology visit] - Check blood BNP - If these abnormal then you might need a procedure called right heart catheterization (please discuss with Dr. Darron)  Right lower lobe 9 mm pulmonary nodule seen April  2024  Plan - Capture this information and the upcoming high-resolution CT chest   Follow-up - 6 weeks with nurse practitioner to discuss test results but ensure you complete all of the above -12 weeks with Dr. Geronimo BOSCH    Return for 6 weeks nurse practitioner in 12 weeks  with Dr. Geronimo.  ( Level 05 visit E&M 2024: Estb >= 40 min  in  visit type: on-site physical face to visit  in total care time and counseling or/and coordination of care by this undersigned MD - Dr Dorethia Geronimo. This includes one or more of the following on this same day 01/21/2024: pre-charting, chart review, note writing, documentation discussion of test results, diagnostic or treatment recommendations, prognosis, risks and benefits of management options, instructions, education, compliance or risk-factor reduction. It excludes time spent by the CMA or office staff in the care of the patient. Actual time 45 min)   SIGNATURE    Dr. Dorethia Geronimo, M.D., F.C.C.P,  Pulmonary and Critical Care Medicine Staff Physician, [facility] Center Director - Interstitial Lung Disease  Program  Pulmonary Fibrosis [facility] at [facility] [place], [place], [address]  Pager: [phone], If no answer or between  15:00h - 7:00h: call [phone] Telephone: [phone]  5:23 PM 01/21/2024

## 2024-01-21 NOTE — Patient Instructions (Addendum)
 ICD-10-CM   1. IPF (idiopathic pulmonary fibrosis) (HCC)  J84.112     2. Positive ANA (antinuclear antibody)  R76.89     3. Enlarged pulmonary artery (HCC)  I28.8     4. DOE (dyspnea on exertion)  R06.09     5. Right lower lobe pulmonary nodule  R91.1     6. Chronic cough  R05.3 Nitric oxide     Shortness of breath Chronic cough   - Exhaled nitric oxide test for asthma was normal  Plan - Do blood allergy workup -Capture other information with CT scan and blood the blood test as described above -    IPF (idiopathic pulmonary fibrosis) (HCC)  ANA positive  Prior intolerance to pirfenidone  with weight loss and elevated liver function test with nintedanib Need to assess current status  Plan - Do spirometry and DLCO - Do overnight pulse oximetry on room air - Do repeat high-resolution CT chest [last 1 was 18 months ago] -Repeat ANA, ESR, CCP, rheumatoid factor and QuantiFERON gold  - If ANA still significantly elevated refer rheumatology - Based on the above results discussed treatment option with Nerandomilast versus clinical trial with IV medication  #Enlarged pulmonary artery o on CT scan of the chest  Plan - Do echocardiogram please do this before February 11, 2024 cardiology visit] - Check blood BNP - If these abnormal then you might need a procedure called right heart catheterization (please discuss with Dr. Darron)  Right lower lobe 9 mm pulmonary nodule seen April 2024  Plan - Capture this information and the upcoming high-resolution CT chest   Follow-up - 6 weeks with nurse practitioner to discuss test results but ensure you complete all of the above -12 weeks with Dr. Geronimo

## 2024-01-22 ENCOUNTER — Ambulatory Visit: Payer: Self-pay | Admitting: Internal Medicine

## 2024-01-22 ENCOUNTER — Other Ambulatory Visit

## 2024-01-22 DIAGNOSIS — J84112 Idiopathic pulmonary fibrosis: Secondary | ICD-10-CM | POA: Diagnosis not present

## 2024-01-22 DIAGNOSIS — R053 Chronic cough: Secondary | ICD-10-CM

## 2024-01-22 DIAGNOSIS — R7689 Other specified abnormal immunological findings in serum: Secondary | ICD-10-CM

## 2024-01-22 DIAGNOSIS — R911 Solitary pulmonary nodule: Secondary | ICD-10-CM

## 2024-01-22 DIAGNOSIS — R0609 Other forms of dyspnea: Secondary | ICD-10-CM

## 2024-01-22 DIAGNOSIS — I288 Other diseases of pulmonary vessels: Secondary | ICD-10-CM

## 2024-01-22 LAB — CBC WITH DIFFERENTIAL/PLATELET
Basophils Absolute: 0 K/uL (ref 0.0–0.1)
Basophils Relative: 0.2 % (ref 0.0–3.0)
Eosinophils Absolute: 0.2 K/uL (ref 0.0–0.7)
Eosinophils Relative: 3.5 % (ref 0.0–5.0)
HCT: 37.2 % (ref 36.0–46.0)
Hemoglobin: 12.2 g/dL (ref 12.0–15.0)
Lymphocytes Relative: 23.6 % (ref 12.0–46.0)
Lymphs Abs: 1.6 K/uL (ref 0.7–4.0)
MCHC: 32.7 g/dL (ref 30.0–36.0)
MCV: 84.7 fl (ref 78.0–100.0)
Monocytes Absolute: 0.8 K/uL (ref 0.1–1.0)
Monocytes Relative: 11.5 % (ref 3.0–12.0)
Neutro Abs: 4 K/uL (ref 1.4–7.7)
Neutrophils Relative %: 61.2 % (ref 43.0–77.0)
Platelets: 251 K/uL (ref 150.0–400.0)
RBC: 4.4 Mil/uL (ref 3.87–5.11)
RDW: 14 % (ref 11.5–15.5)
WBC: 6.6 K/uL (ref 4.0–10.5)

## 2024-01-22 LAB — SEDIMENTATION RATE: Sed Rate: 19 mm/h (ref 0–30)

## 2024-01-22 LAB — BRAIN NATRIURETIC PEPTIDE: Pro B Natriuretic peptide (BNP): 34 pg/mL (ref 0.0–100.0)

## 2024-01-22 NOTE — Addendum Note (Signed)
 Addended by: ROLANDA POWELL SAILOR on: 01/22/2024 03:07 PM   Modules accepted: Orders

## 2024-01-23 ENCOUNTER — Telehealth: Payer: Self-pay | Admitting: Cardiovascular Disease

## 2024-01-23 NOTE — Telephone Encounter (Signed)
 Forwarding to provider.

## 2024-01-23 NOTE — Telephone Encounter (Signed)
 Pt is having an echo on 10/20 ordered by her pulmonologist. Pt wants to let Dr. Darron know she will be having this done prior to her 11/4 appt with him.

## 2024-01-24 LAB — ANTI-NUCLEAR AB-TITER (ANA TITER)
ANA TITER: 1:40 {titer} — ABNORMAL HIGH
ANA Titer 1: 1:1280 {titer} — ABNORMAL HIGH

## 2024-01-24 LAB — CYCLIC CITRUL PEPTIDE ANTIBODY, IGG: Cyclic Citrullin Peptide Ab: <16 U

## 2024-01-24 LAB — QUANTIFERON-TB GOLD PLUS
Mitogen-NIL: 8.43 [IU]/mL
NIL: 0.01 [IU]/mL
QuantiFERON-TB Gold Plus: NEGATIVE
TB1-NIL: 0 [IU]/mL
TB2-NIL: 0 [IU]/mL

## 2024-01-24 LAB — RHEUMATOID FACTOR: Rheumatoid fact SerPl-aCnc: <10 [IU]/mL (ref <14)

## 2024-01-24 LAB — ANA: Anti Nuclear Antibody (ANA): POSITIVE — AB

## 2024-01-25 LAB — ALLERGEN PROFILE, PERENNIAL ALLERGEN IGE

## 2024-01-27 ENCOUNTER — Ambulatory Visit: Payer: Self-pay | Admitting: Internal Medicine

## 2024-01-27 ENCOUNTER — Ambulatory Visit (HOSPITAL_COMMUNITY)
Admission: RE | Admit: 2024-01-27 | Discharge: 2024-01-27 | Disposition: A | Discharge status: Home / Self Care | Source: Ambulatory Visit | Attending: Cardiology | Admitting: Cardiology

## 2024-01-27 DIAGNOSIS — R0600 Dyspnea, unspecified: Secondary | ICD-10-CM

## 2024-01-27 DIAGNOSIS — I288 Other diseases of pulmonary vessels: Secondary | ICD-10-CM | POA: Diagnosis not present

## 2024-01-27 DIAGNOSIS — R7689 Other specified abnormal immunological findings in serum: Secondary | ICD-10-CM | POA: Diagnosis not present

## 2024-01-27 DIAGNOSIS — R0609 Other forms of dyspnea: Secondary | ICD-10-CM | POA: Diagnosis not present

## 2024-01-27 DIAGNOSIS — R053 Chronic cough: Secondary | ICD-10-CM | POA: Diagnosis not present

## 2024-01-27 DIAGNOSIS — R911 Solitary pulmonary nodule: Secondary | ICD-10-CM | POA: Diagnosis not present

## 2024-01-27 DIAGNOSIS — J84112 Idiopathic pulmonary fibrosis: Secondary | ICD-10-CM | POA: Diagnosis not present

## 2024-01-29 LAB — ECHOCARDIOGRAM COMPLETE
Area-P 1/2: 3.52 cm2
MV M vel: 6.16 m/s
MV Peak grad: 151.8 mmHg
P 1/2 time: 409 ms
Radius: 0.4 cm
S' Lateral: 2.4 cm

## 2024-01-30 ENCOUNTER — Other Ambulatory Visit: Payer: Self-pay | Admitting: Internal Medicine

## 2024-02-11 ENCOUNTER — Encounter: Payer: Self-pay | Admitting: Cardiovascular Disease

## 2024-02-11 ENCOUNTER — Ambulatory Visit: Attending: Cardiovascular Disease | Admitting: Cardiovascular Disease

## 2024-02-11 VITALS — BP 130/72 | HR 67 | Ht 64.0 in | Wt 154.4 lb

## 2024-02-11 DIAGNOSIS — I10 Essential (primary) hypertension: Secondary | ICD-10-CM | POA: Diagnosis not present

## 2024-02-11 DIAGNOSIS — I272 Pulmonary hypertension, unspecified: Secondary | ICD-10-CM | POA: Diagnosis not present

## 2024-02-11 DIAGNOSIS — I739 Peripheral vascular disease, unspecified: Secondary | ICD-10-CM

## 2024-02-11 DIAGNOSIS — E785 Hyperlipidemia, unspecified: Secondary | ICD-10-CM | POA: Diagnosis not present

## 2024-02-11 MED ORDER — CARVEDILOL 6.25 MG PO TABS: 6.2500 mg | ORAL_TABLET | Freq: Two times a day (BID) | ORAL | 1 refills | Status: AC

## 2024-02-11 NOTE — Progress Notes (Unsigned)
 Cardiology Office Note   Date:  02/11/2024   ID:  Kayla Craig, DOB [birth year], MRN [id number]  PCP:  Geofm Glade PARAS, MD  Cardiologist:   Deatrice Cage, MD   No chief complaint on file.      History of Present Illness: Kayla Craig is a 88 y.o. female who is here today for follow-up visit regarding peripheral arterial disease. She has no prior cardiac history.  She has multiple chronic medical conditions including essential hypertension, type 2 diabetes, hyperlipidemia, GERD and osteoarthritis.  She suffers from hammertoes on both sides but with significant discomfort affecting the right second toe with some discoloration.  She was initially planning to have surgery done and was referred for vascular evaluation before that. She underwent noninvasive vascular evaluation in August 2021 which showed an ABI of 0.78 on the right and 0.65 on the left. Duplex showed severe stenosis in the right popliteal artery with occluded posterior tibial artery.  On the left, there was also severe stenosis in the popliteal artery extending into the TP trunk with occluded posterior tibial artery.  She also has pulmonary fibrosis followed by pulmonary.  She had a Lexiscan  Myoview  in July 2022 that showed no evidence of ischemia with normal ejection fraction.    Renal artery duplex was done which showed no evidence of renal artery stenosis.  She reports having a syncopal episode recently when she stood up and felt dizzy followed by brief loss of consciousness.  No chest pain or worsening dyspnea. She denies calf claudication but she is limited by low back and hip discomfort.  Past Medical History:  Diagnosis Date   B12 deficiency    Back pain    Chronic   Goiter    Grief    Grief and loss related depression '08   Hypertension    TMJ (dislocation of temporomandibular joint)    Right Side   Vitamin D  deficiency     Past Surgical History:  Procedure Laterality Date   EYE SURGERY  2010    cataract surgery with implants   KNEE SURGERY     Right Arthroscopy '05 Dr Ernie   THYROIDECTOMY     August '09 for goiter (Dr Roark)   TUBAL LIGATION       Current Outpatient Medications  Medication Sig Dispense Refill   amLODipine  (NORVASC ) 10 MG tablet Take 1 tablet by mouth once daily 90 tablet 0   aspirin 81 MG EC tablet Take 81 mg by mouth daily.     Calcium  Carbonate-Vit D-Min (CALCIUM  1200 PO) Take by mouth.     carvedilol  (COREG ) 12.5 MG tablet Take 1 tablet (12.5 mg total) by mouth 2 (two) times daily. 180 tablet 3   cholecalciferol (VITAMIN D3) 25 MCG (1000 UNIT) tablet Take 1,000 Units by mouth daily.     famotidine  (PEPCID ) 20 MG tablet Take 1 tablet by mouth twice daily as needed 60 tablet 0   levocetirizine (XYZAL ) 5 MG tablet Take 1 tablet (5 mg total) by mouth every evening. 30 tablet 5   losartan  (COZAAR ) 100 MG tablet Take 1 tablet (100 mg total) by mouth daily. 90 tablet 1   metFORMIN  (GLUCOPHAGE -XR) 500 MG 24 hr tablet Take 1 tablet by mouth once daily with breakfast 90 tablet 0   rosuvastatin  (CRESTOR ) 20 MG tablet Take 1 tablet (20 mg total) by mouth daily. 90 tablet 2   vitamin B-12 (CYANOCOBALAMIN ) 1000 MCG tablet Take 1,000 mcg by mouth daily.  albuterol  (VENTOLIN  HFA) 108 (90 Base) MCG/ACT inhaler Inhale 2 puffs into the lungs every 6 (six) hours as needed (cough or shortness of breath). (Patient not taking: Reported on 02/11/2024) 8 g 1   levothyroxine  (SYNTHROID ) 50 MCG tablet TAKE 1 TABLET BY MOUTH ONCE DAILY BEFORE BREAKFAST (Patient not taking: Reported on 02/11/2024) 90 tablet 0   omeprazole  (PRILOSEC) 40 MG capsule Take 1 capsule (40 mg total) by mouth daily. Take 30 minutes prior to a meal (Patient not taking: Reported on 02/11/2024) 90 capsule 3   No current facility-administered medications for this visit.    Allergies:   Codeine , Esbriet  [pirfenidone ], and Nexletol [bempedoic acid]    Social History:  The patient  reports that she has never smoked.  She has been exposed to tobacco smoke. She has never used smokeless tobacco. She reports that she does not drink alcohol and does not use drugs.   Family History:  The patient's family history includes Cancer in her brother and mother; Heart disease in her brother, brother, and father.    ROS:  Please see the history of present illness.   Otherwise, review of systems are positive for none.   All other systems are reviewed and negative.    PHYSICAL EXAM: VS:  BP 130/72 (BP Location: Left Arm, Patient Position: Sitting)   Pulse 67   Ht 5' 4 (1.626 m)   Wt 154 lb 6.4 oz (70 kg)   SpO2 91%   BMI 26.50 kg/m  , BMI Body mass index is 26.5 kg/m. GEN: Well nourished, well developed, in no acute distress  HEENT: normal  Neck: no JVD, carotid bruits, or masses Cardiac: RRR; no murmurs, rubs, or gallops,no edema  Respiratory:  clear to auscultation bilaterally, normal work of breathing GI: soft, nontender, nondistended, + BS MS: no deformity or atrophy  Skin: warm and dry, no rash Neuro:  Strength and sensation are intact Psych: euthymic mood, full affect Vascular: Femoral pulses normal bilaterally.  Distal pulses are not palpable.    EKG:  EKG is  ordered today. EKG showed: Sinus rhythm with 1st degree A-V block with occasional Premature ventricular complexes Moderate voltage criteria for LVH, may be normal variant ( R in aVL , Cornell product ) When compared with ECG of 19-Mar-2023 09:58, Premature ventricular complexes are now Present   Recent Labs: 12/25/2023: ALT 12; TSH 2.26 01/21/2024: BUN 20; Creatinine, Ser 1.22; Potassium 4.4; Sodium 136 01/22/2024: Hemoglobin 12.2; Platelets 251.0; Pro B Natriuretic peptide (BNP) 34.0    Lipid Panel    Component Value Date/Time   CHOL 135 11/28/2023 0924   TRIG 109 11/28/2023 0924   HDL 60 11/28/2023 0924   CHOLHDL 2.3 11/28/2023 0924   CHOLHDL 4 02/22/2023 1446   VLDL 61.2 (H) 09/21/2022 1529   LDLCALC 55 11/28/2023 0924    LDLDIRECT 88.0 02/22/2023 1446      Wt Readings from Last 3 Encounters:  02/11/24 154 lb 6.4 oz (70 kg)  01/21/24 154 lb 9.6 oz (70.1 kg)  12/24/23 155 lb (70.3 kg)          No data to display            ASSESSMENT AND PLAN:  1.  Peripheral arterial disease: The patient has evidence of severe popliteal artery stenosis bilaterally slightly worse on the left side given the extension into the TP trunk.  She continues to be asymptomatic.  Continue medical therapy. Her low back and hip discomfort is not due to peripheral arterial  disease.  Her femoral pulses are normal bilaterally.  2.  Refractory hypertension: Blood pressure is not well-controlled on current medications.  Renal artery duplex showed no evidence of renal artery stenosis.  3.  Hyperlipidemia: She was switched from rosuvastatin  to atorvastatin  but went back to rosuvastatin  as she felt atorvastatin  might have caused her syncopal episode.  4.  Pulmonary fibrosis: Followed by pulmonary.  5.  Syncope: Likely due to orthostatic hypotension but we have to exclude arrhythmia.  I requested a 2-week ZIO monitor. Echocardiogram in 2023 showed normal LV systolic function.     Disposition:   FU with me in 6 months  Signed,  Deatrice Cage, MD  02/11/2024 11:40 AM    [facility]

## 2024-02-11 NOTE — Patient Instructions (Signed)
 Medication Instructions:  DECREASE the Carvedilol  to 6.25 mg twice daily  *If you need a refill on your cardiac medications before your next appointment, please call your pharmacy*  Lab Work: None ordered If you have labs (blood work) drawn today and your tests are completely normal, you will receive your results only by: MyChart Message (if you have MyChart) OR A paper copy in the mail If you have any lab test that is abnormal or we need to change your treatment, we will call you to review the results.  Testing/Procedures: None ordered  Follow-Up: At [facility], you and your health needs are our priority.  As part of our continuing mission to provide you with exceptional heart care, our providers are all part of one team.  This team includes your primary Cardiologist (physician) and Advanced Practice Providers or APPs (Physician Assistants and Nurse Practitioners) who all work together to provide you with the care you need, when you need it.  Your next appointment:   6 month(s)  Provider:   Deatrice Cage, MD    We recommend signing up for the patient portal called MyChart.  Sign up information is provided on this After Visit Summary.  MyChart is used to connect with patients for Virtual Visits (Telemedicine).  Patients are able to view lab/test results, encounter notes, upcoming appointments, etc.  Non-urgent messages can be sent to your provider as well.   To learn more about what you can do with MyChart, go to [email or web address].

## 2024-02-13 ENCOUNTER — Other Ambulatory Visit: Payer: Self-pay | Admitting: Internal Medicine

## 2024-02-14 ENCOUNTER — Ambulatory Visit (INDEPENDENT_AMBULATORY_CARE_PROVIDER_SITE_OTHER): Admitting: *Deleted

## 2024-02-14 ENCOUNTER — Telehealth: Payer: Self-pay | Admitting: *Deleted

## 2024-02-14 DIAGNOSIS — J84112 Idiopathic pulmonary fibrosis: Secondary | ICD-10-CM

## 2024-02-14 DIAGNOSIS — R911 Solitary pulmonary nodule: Secondary | ICD-10-CM

## 2024-02-14 DIAGNOSIS — R0609 Other forms of dyspnea: Secondary | ICD-10-CM

## 2024-02-14 DIAGNOSIS — R053 Chronic cough: Secondary | ICD-10-CM

## 2024-02-14 DIAGNOSIS — J849 Interstitial pulmonary disease, unspecified: Secondary | ICD-10-CM

## 2024-02-14 DIAGNOSIS — R7689 Other specified abnormal immunological findings in serum: Secondary | ICD-10-CM

## 2024-02-14 DIAGNOSIS — I288 Other diseases of pulmonary vessels: Secondary | ICD-10-CM

## 2024-02-14 LAB — PULMONARY FUNCTION TEST
DL/VA % pred: 70 %
DL/VA: 2.84 ml/min/mmHg/L
DLCO cor % pred: 52 %
DLCO cor: 9.6 ml/min/mmHg
DLCO unc % pred: 50 %
DLCO unc: 9.23 ml/min/mmHg
FEF 25-75 Pre: 1.47 L/s
FEF2575-%Pred-Pre: 133 %
FEV1-%Pred-Pre: 89 %
FEV1-Pre: 1.55 L
FEV1FVC-%Pred-Pre: 110 %
FEV6-%Pred-Pre: 86 %
FEV6-Pre: 1.91 L
FEV6FVC-%Pred-Pre: 106 %
FVC-%Pred-Pre: 82 %
FVC-Pre: 1.94 L
Pre FEV1/FVC ratio: 80 %
Pre FEV6/FVC Ratio: 100 %

## 2024-02-14 NOTE — Telephone Encounter (Signed)
 Patient will see TP on 02/27/24. Per plan below spiro/dlco performed today and blood work previously. ONO and HRCT were not ordered; is this something you still want?      01/21/2024 Plan - Do spirometry and DLCO - Do overnight pulse oximetry on room air - Do repeat high-resolution CT chest [last 1 was 18 months ago] -Repeat ANA, ESR, CCP, rheumatoid factor and QuantiFERON gold             - If ANA still significantly elevated refer rheumatology - Based on the above results discussed treatment option with Nerandomilast versus clinical trial with IV medication

## 2024-02-14 NOTE — Telephone Encounter (Signed)
 Blood alletey test normal But seriology - ANA strongly positive PFT abnromal  Plan  - yes need ono  - yes need HRCT  - in addition, need additional seriology - ordered given screening test poisitve        Latest Ref Rng & Units 02/14/2024   12:40 PM 01/27/2021    1:36 PM  PFT Results  FVC-Pre L 1.94  P 1.73   FVC-Predicted Pre % 82  P 69   FVC-Post L  1.55   FVC-Predicted Post %  62   Pre FEV1/FVC % % 80  P 88   Post FEV1/FCV % %  89   FEV1-Pre L 1.55  P 1.52   FEV1-Predicted Pre % 89  P 82   FEV1-Post L  1.38   DLCO uncorrected ml/min/mmHg 9.23  P 10.13   DLCO UNC% % 50  P 54   DLCO corrected ml/min/mmHg 9.60  P   DLCO COR %Predicted % 52  P   DLVA Predicted % 70  P 82   TLC L  2.99   TLC % Predicted %  59   RV % Predicted %  45     P Preliminary result

## 2024-02-14 NOTE — Progress Notes (Signed)
 Spirometry and diffusion capacity performed today.

## 2024-02-14 NOTE — Patient Instructions (Signed)
 Spirometry and diffusion capacity performed today.

## 2024-02-17 NOTE — Telephone Encounter (Signed)
 I called and spoke with the pt and notified of response per MR  I have placed orders for ONO and HRCT  She wishes to get the labs done at her upcoming ov with TP  Nothing further needed.

## 2024-02-21 ENCOUNTER — Other Ambulatory Visit

## 2024-02-23 ENCOUNTER — Ambulatory Visit: Payer: Self-pay | Admitting: Internal Medicine

## 2024-02-23 NOTE — Progress Notes (Signed)
 Trace positive ANA. Rs normal. Discuss with Tammy at followup

## 2024-02-26 ENCOUNTER — Other Ambulatory Visit: Payer: Self-pay | Admitting: Internal Medicine

## 2024-02-27 ENCOUNTER — Encounter: Payer: Self-pay | Admitting: Adult Health

## 2024-02-27 ENCOUNTER — Ambulatory Visit: Admitting: Adult Health

## 2024-02-27 VITALS — BP 116/53 | HR 80 | Temp 97.5°F | Ht 64.0 in | Wt 155.2 lb

## 2024-02-27 DIAGNOSIS — J849 Interstitial pulmonary disease, unspecified: Secondary | ICD-10-CM

## 2024-02-27 DIAGNOSIS — I272 Pulmonary hypertension, unspecified: Secondary | ICD-10-CM | POA: Diagnosis not present

## 2024-02-27 DIAGNOSIS — R0609 Other forms of dyspnea: Secondary | ICD-10-CM

## 2024-02-27 DIAGNOSIS — R7689 Other specified abnormal immunological findings in serum: Secondary | ICD-10-CM | POA: Diagnosis not present

## 2024-02-27 DIAGNOSIS — I739 Peripheral vascular disease, unspecified: Secondary | ICD-10-CM

## 2024-02-27 DIAGNOSIS — R053 Chronic cough: Secondary | ICD-10-CM

## 2024-02-27 NOTE — Patient Instructions (Addendum)
 CT chest as planned  Overnight oximetry test as planned.  Set up for PFT in 6 months  Activity as tolerated.  Labs today  Follow up with Dr. Geronimo in 6 months and As needed

## 2024-02-27 NOTE — Progress Notes (Signed)
 @Patient  ID: Kayla Craig, female    DOB: 08-Oct-1935, 88 y.o.   MRN: [id number]  Chief Complaint  Patient presents with   Medical Management of Chronic Issues    IPF f/u    Referring provider: Geronimo Amel, MD  HPI: 88 year old female never smoker followed for IPF  ILD workup:  Pets: No pets Occupation: Used to work in a transport planner Exposures: Was exposed to acetone while working.  No ongoing exposures.  No mold, hot tub, Jacuzzi.  No feather pillows or comforters Smoking history: Never smoker Travel history: No significant travel history Relevant family history: No family history of lung disease MDC 12/2020 >IPF, started on Esbriet  (stopped d/t wt loss)  Changed to Ofev  06/2021 (stopped due to elevated LFT)  Rheumatology Rice MD 01/2021- did not feel was CTD .    TEST/EVENTS : Reviewed 02/27/2024  Echo January 21, 2024 EF 60 to 65%, grade 1 diastolic dysfunction, right ventricular systolic function is normal, RV size is normal, moderately elevated pulmonary artery systolic pressure with RVSP at 52.3 mmHg  High-resolution CT chest July 10, 2022 peripheral and basilar predominant subpleural reticulation, ground glass, traction bronchiectasis and honeycombing slightly progressive since May 2023, new subpleural nodule right lower lobe.  HRCT chest pending 03/02/24>  October 2025 QuantiFERON gold negative, ANA titer positive (1:1,280) , CCP negative, rheumatoid factor negative, sed rate negative Allergen panel negative  Discussed the use of AI scribe software for clinical note transcription with the patient, who gave verbal consent to proceed.  History of Present Illness Kayla Craig is an 88 year old female with pulmonary fibrosis who presents for a 1 month follow-up visit.  She experiences chronic shortness of breath with activities. She is not on oxygen. Has felt episodes of coughing have been less frequent in the past couple of weeks. She previously used Ofev  and  Esbriet  for her fibrosis but discontinued them due to severe side effects, predominately GI side effects.  PFT earlier this month showed stable lung function compared to 2022.  FEV1 at 89%, ratio 80, FVC 82%, DLCO 50%.  (Slightly decreased from 54%) Labs showed elevated ANA . CTD serology pending.  CT chest is pending.Recent Echo showed modereate pulmonary hypertension  She has PAD followed by cardiology for severe popliteal artery stenosis bilaterally.    Has neuropathy. She experiences numbness in her toes every evening, which persists throughout the night. She uses a cream to alleviate the pain.   She lives alone, manages her grocery shopping and cleaning independently, and is still driving. She used to walk a lot but has reduced her activity due  foot pain.   She decline walk test or sit and stand test today.   She has had a flu shot and an RSV shot        Allergies  Allergen Reactions   Codeine  Nausea And Vomiting    Dizzy   Esbriet  [Pirfenidone ]    Nexletol [Bempedoic Acid]     Nausea and weakness   Ofev  [Nintedanib]     Loss of appetite and low energy.    Immunization History  Administered Date(s) Administered   Fluad Quad(high Dose 65+) 12/12/2018   INFLUENZA, HIGH DOSE SEASONAL PF 01/06/2018, 12/12/2018, 12/03/2020, 01/09/2022, 12/19/2022, 01/17/2024   Influenza Whole 01/31/2009, 02/28/2012   Influenza,inj,Quad PF,6+ Mos 01/11/2014   Influenza-Unspecified 01/29/2013, 12/22/2015, 12/08/2016, 01/09/2020   Moderna Sars-Covid-2 Vaccination 06/17/2019, 07/15/2019, 03/16/2020   Pneumococcal Conjugate-13 12/14/2014   Pneumococcal Polysaccharide-23 05/08/2012   Tetanus 05/08/2012  Past Medical History:  Diagnosis Date   B12 deficiency    Back pain    Chronic   Goiter    Grief    Grief and loss related depression '08   Hypertension    TMJ (dislocation of temporomandibular joint)    Right Side   Vitamin D  deficiency     Tobacco History: Social History    Tobacco Use  Smoking Status Never   Passive exposure: Past  Smokeless Tobacco Never   Counseling given: Not Answered   Outpatient Medications Prior to Visit  Medication Sig Dispense Refill   albuterol  (VENTOLIN  HFA) 108 (90 Base) MCG/ACT inhaler Inhale 2 puffs into the lungs every 6 (six) hours as needed (cough or shortness of breath). 8 g 1   amLODipine  (NORVASC ) 10 MG tablet Take 1 tablet by mouth once daily 90 tablet 0   aspirin 81 MG EC tablet Take 81 mg by mouth daily.     Calcium  Carbonate-Vit D-Min (CALCIUM  1200 PO) Take by mouth.     carvedilol  (COREG ) 6.25 MG tablet Take 1 tablet (6.25 mg total) by mouth 2 (two) times daily. 180 tablet 1   cholecalciferol (VITAMIN D3) 25 MCG (1000 UNIT) tablet Take 1,000 Units by mouth daily.     famotidine  (PEPCID ) 20 MG tablet Take 1 tablet by mouth twice daily as needed 60 tablet 2   levocetirizine (XYZAL ) 5 MG tablet Take 1 tablet (5 mg total) by mouth every evening. 30 tablet 5   levothyroxine  (SYNTHROID ) 50 MCG tablet TAKE 1 TABLET BY MOUTH ONCE DAILY BEFORE BREAKFAST 90 tablet 0   losartan  (COZAAR ) 100 MG tablet Take 1 tablet (100 mg total) by mouth daily. 90 tablet 1   metFORMIN  (GLUCOPHAGE -XR) 500 MG 24 hr tablet Take 1 tablet by mouth once daily with breakfast 90 tablet 0   rosuvastatin  (CRESTOR ) 20 MG tablet Take 1 tablet (20 mg total) by mouth daily. 90 tablet 2   vitamin B-12 (CYANOCOBALAMIN ) 1000 MCG tablet Take 1,000 mcg by mouth daily.     omeprazole  (PRILOSEC) 40 MG capsule Take 1 capsule (40 mg total) by mouth daily. Take 30 minutes prior to a meal (Patient not taking: Reported on 02/27/2024) 90 capsule 3   No facility-administered medications prior to visit.     Review of Systems:   Constitutional:   No  weight loss, night sweats,  Fevers, chills,+ fatigue, or  lassitude.  HEENT:   No headaches,  Difficulty swallowing,  Tooth/dental problems, or  Sore throat,                No sneezing, itching, ear ache, nasal  congestion, post nasal drip,   CV:  No chest pain,  Orthopnea, PND, swelling in lower extremities, anasarca, dizziness, palpitations, syncope.   GI  No heartburn, indigestion, abdominal pain, nausea, vomiting, diarrhea, change in bowel habits, loss of appetite, bloody stools.   Resp: .  No chest wall deformity  Skin: no rash or lesions.  GU: no dysuria, change in color of urine, no urgency or frequency.  No flank pain, no hematuria   MS:  No joint pain or swelling.  No decreased range of motion.  No back pain.    Physical Exam  BP (!) 116/53   Pulse 80   Temp (!) 97.5 F (36.4 C)   Ht 5' 4 (1.626 m) Comment: Per pt  Wt 155 lb 3.2 oz (70.4 kg)   SpO2 94% Comment: RA  BMI 26.64 kg/m   GEN: A/Ox3;  pleasant , NAD, elderly    HEENT:  Eschbach/AT,, NOSE-clear, THROAT-clear, no lesions, no postnasal drip or exudate noted.   NECK:  Supple w/ fair ROM; no JVD; normal carotid impulses w/o bruits; no thyromegaly or nodules palpated; no lymphadenopathy.    RESP  Clear  P & A; w/o, wheezes/ rales/ or rhonchi. no accessory muscle use, no dullness to percussion  CARD:  RRR, no m/r/g, no peripheral edema, pulses intact, no cyanosis or clubbing.  GI:   Soft & nt; nml bowel sounds; no organomegaly or masses detected.   Musco: Warm bil, no deformities or joint swelling noted.   Neuro: alert, no focal deficits noted.    Skin: Warm, no lesions or rashes     Lab Results:Reviewed 02/27/2024   CBC    Component Value Date/Time   WBC 6.6 01/22/2024 1510   RBC 4.40 01/22/2024 1510   HGB 12.2 01/22/2024 1510   HCT 37.2 01/22/2024 1510   PLT 251.0 01/22/2024 1510   MCV 84.7 01/22/2024 1510   MCH 27.9 11/19/2022 0440   MCHC 32.7 01/22/2024 1510   RDW 14.0 01/22/2024 1510   LYMPHSABS 1.6 01/22/2024 1510   MONOABS 0.8 01/22/2024 1510   EOSABS 0.2 01/22/2024 1510   BASOSABS 0.0 01/22/2024 1510    BMET    Component Value Date/Time   NA 136 01/21/2024 1026   K 4.4 01/21/2024 1026    CL 102 01/21/2024 1026   CO2 25 01/21/2024 1026   GLUCOSE 145 (H) 01/21/2024 1026   BUN 20 01/21/2024 1026   CREATININE 1.22 (H) 01/21/2024 1026   CALCIUM  9.6 01/21/2024 1026   GFRNONAA >60 11/19/2022 0525   GFRAA >90 12/27/2012 0430    BNP No results found for: BNP  ProBNP    Component Value Date/Time   PROBNP 34.0 01/22/2024 1510    Imaging: No results found.  Administration History     None          Latest Ref Rng & Units 02/14/2024   12:40 PM 01/27/2021    1:36 PM  PFT Results  FVC-Pre L 1.94  P 1.73   FVC-Predicted Pre % 82  P 69   FVC-Post L  1.55   FVC-Predicted Post %  62   Pre FEV1/FVC % % 80  P 88   Post FEV1/FCV % %  89   FEV1-Pre L 1.55  P 1.52   FEV1-Predicted Pre % 89  P 82   FEV1-Post L  1.38   DLCO uncorrected ml/min/mmHg 9.23  P 10.13   DLCO UNC% % 50  P 54   DLCO corrected ml/min/mmHg 9.60  P   DLCO COR %Predicted % 52  P   DLVA Predicted % 70  P 82   TLC L  2.99   TLC % Predicted %  59   RV % Predicted %  45     P Preliminary result    Lab Results  Component Value Date   NITRICOXIDE 13 01/21/2024        No data to display              Assessment & Plan:   Assessment and Plan Assessment & Plan Idiopathic pulmonary fibrosis with chronic cough  -clinically stable Patient declined walk test or sit stand test today.  Previous treatment with Ofev  was not tolerated due to significant weight loss and decreased appetite. She declined a new medication for pulmonary fibrosis-Nerandomilast  due to potential side effects, including diarrhea, decreased appetite,  and weight loss. HRCT is pending. PFT are essentially stable. Advised to repeat in 6 months .  Additional autoimmune and connective tissue serology is pending  Pulmonary hypertension associated IPF-noted on echo.  Right heart catheterization was discussed but declined  There is a potential need for oxygen therapy at night due to possible nocturnal hypoxemia. . An overnight  oximetry test is pending to assess nocturnal oxygen levels. Oxygen therapy at night will be considered if oximetry indicates hypoxemia.  Peripheral artery disease of lower extremities   Severe stenosis in the popliteal arteries.  Continue follow-up with cardiology  Plan  Patient Instructions  CT chest as planned  Overnight oximetry test as planned.  Set up for PFT in 6 months  Activity as tolerated.  Labs today  Follow up with Dr. Geronimo in 6 months and As needed              Madelin Stank, NP 02/27/2024  I spent  32 minutes dedicated to the care of this patient on the date of this encounter to include pre-visit review of records, face-to-face time with the patient discussing conditions above, post visit ordering of testing, clinical documentation with the electronic health record, making appropriate referrals as documented, and communicating necessary findings to members of the patients care team.

## 2024-03-02 ENCOUNTER — Ambulatory Visit
Admission: RE | Admit: 2024-03-02 | Discharge: 2024-03-02 | Disposition: A | Source: Ambulatory Visit | Attending: Internal Medicine | Admitting: Internal Medicine

## 2024-03-02 DIAGNOSIS — J84114 Acute interstitial pneumonitis: Secondary | ICD-10-CM | POA: Diagnosis not present

## 2024-03-02 DIAGNOSIS — J849 Interstitial pulmonary disease, unspecified: Secondary | ICD-10-CM

## 2024-03-02 DIAGNOSIS — I700 Atherosclerosis of aorta: Secondary | ICD-10-CM | POA: Diagnosis not present

## 2024-03-02 DIAGNOSIS — R7689 Other specified abnormal immunological findings in serum: Secondary | ICD-10-CM

## 2024-03-03 LAB — ALDOLASE: Aldolase: 4.4 U/L (ref <=8.1)

## 2024-03-03 LAB — MYOSITIS SPECIFIC II ANTIBODIES PANEL
EJ AB: <11 SI (ref <11)
JO-1 AB: <11 SI (ref <11)
MDA-5 AB: <11 SI (ref <11)
MI-2 ALPHA AB: <11 SI (ref <11)
MI-2 BETA AB: <11 SI (ref <11)
NXP-2 AB: <11 SI (ref <11)
OJ AB: <11 SI (ref <11)
PL-12 AB: <11 SI (ref <11)
PL-7 AB: 30 SI — ABNORMAL HIGH (ref <11)
SRP-AB: <11 SI (ref <11)
TIF-1y AB: 98 SI — ABNORMAL HIGH (ref <11)

## 2024-03-03 LAB — ANTI-SCLERODERMA ANTIBODY: Scleroderma (Scl-70) (ENA) Antibody, IgG: <1 AI

## 2024-03-03 LAB — SJOGRENS SYNDROME-B EXTRACTABLE NUCLEAR ANTIBODY: SSB (La) (ENA) Antibody, IgG: 1.9 AI — AB

## 2024-03-03 LAB — SJOGRENS SYNDROME-A EXTRACTABLE NUCLEAR ANTIBODY: SSA (Ro) (ENA) Antibody, IgG: <1 AI

## 2024-03-06 ENCOUNTER — Ambulatory Visit: Payer: Self-pay | Admitting: Internal Medicine

## 2024-03-06 NOTE — Telephone Encounter (Signed)
 There is pumonary fibrosis on CTchest and pulmonary hypertension. THis was discussed by Tammy but she has declined further Rx and workup  LEt her know to keep followup in 6 month

## 2024-03-09 NOTE — Telephone Encounter (Signed)
 I called and spoke with patient, advised of information per Dr. Geronimo.  She states she has been told that she has bad blood flow and would not be able to have any surgery.  She does not currently have a f/u in place as his schedule was not available at her LOV.  I advised her that she would received a letter in the mail reminding her to call to schedule a f/u closer to that time.  She verbalized understanding.  Nothing further needed.

## 2024-03-10 DIAGNOSIS — G4730 Sleep apnea, unspecified: Secondary | ICD-10-CM | POA: Diagnosis not present

## 2024-03-10 DIAGNOSIS — R0902 Hypoxemia: Secondary | ICD-10-CM | POA: Diagnosis not present

## 2024-03-12 ENCOUNTER — Telehealth: Payer: Self-pay | Admitting: *Deleted

## 2024-03-12 NOTE — Telephone Encounter (Signed)
 Copied from CRM #[id number]. Topic: General - Other >> Mar 09, 2024  1:01 PM Lavanda D wrote: Reason for CRM: Patient would like to speak with nurse Powell about a machine/breathing test. She said they are supposed to bring it today and is wondering if they will?  Called the pt and there was no answer- LMTCB.

## 2024-03-20 ENCOUNTER — Encounter: Payer: Self-pay | Admitting: Pharmacist

## 2024-03-20 NOTE — Progress Notes (Signed)
 Pharmacy Quality Measure Review  This patient is appearing on a report for being at risk of failing the adherence measure for cholesterol (statin), diabetes, and hypertension (ACEi/ARB) medications this calendar year.   Medication: Losartan  Last fill date: 02/09/24 for 90 day supply  Medication: Rosuvastatin  Last fill date: 01/30/24 for 90 day supply   Medication: Metformin  Last fill date: 01/30/24 for 90 day supply  Insurance report was not up to date. No action needed at this time.   Darrelyn Drum, PharmD, BCPS, CPP Clinical Pharmacist Practitioner [facility] [phone]

## 2024-03-24 NOTE — Telephone Encounter (Signed)
 Called regarding CRM below Called pt- pt was informed about pft and ct result per Dr Geronimo message dated 11/7 & 11/28. Patient said she doesn't understand results; told pt the doctor will discuss in detail result in upcoming apt/recall 04/20/2024. She verbalized understanding and no concern,

## 2024-03-27 ENCOUNTER — Encounter: Payer: Self-pay | Admitting: Internal Medicine

## 2024-03-27 ENCOUNTER — Ambulatory Visit: Payer: Self-pay | Admitting: Adult Health

## 2024-03-27 DIAGNOSIS — R7689 Other specified abnormal immunological findings in serum: Secondary | ICD-10-CM

## 2024-03-27 DIAGNOSIS — J849 Interstitial pulmonary disease, unspecified: Secondary | ICD-10-CM

## 2024-03-27 NOTE — Progress Notes (Signed)
 Called and spoke to pt - advised of ONO results per Madelin Stank, NP. Pt verbalized understanding, NFN.

## 2024-03-29 ENCOUNTER — Other Ambulatory Visit: Payer: Self-pay | Admitting: Internal Medicine

## 2024-04-01 NOTE — Telephone Encounter (Signed)
 Kayla Craig several autoimmune antibodies are positive including they myositis panel.I know she was declining lot of Rx with you. Since you saw her recently If you can reach out to her and shre these results + offer rheum referral will be great  I did put an order for rehum

## 2024-04-26 ENCOUNTER — Other Ambulatory Visit: Payer: Self-pay | Admitting: Cardiovascular Disease

## 2024-04-26 ENCOUNTER — Other Ambulatory Visit: Payer: Self-pay | Admitting: Internal Medicine

## 2024-04-29 NOTE — Telephone Encounter (Signed)
 Lipid Completed on 11/28/23

## 2024-05-05 ENCOUNTER — Other Ambulatory Visit: Payer: Self-pay | Admitting: Internal Medicine

## 2024-05-06 ENCOUNTER — Ambulatory Visit: Admitting: Internal Medicine

## 2024-05-06 ENCOUNTER — Ambulatory Visit: Payer: Self-pay | Admitting: Internal Medicine

## 2024-05-06 DIAGNOSIS — J849 Interstitial pulmonary disease, unspecified: Secondary | ICD-10-CM

## 2024-05-06 NOTE — Telephone Encounter (Signed)
 Noted. NFN

## 2024-05-06 NOTE — Telephone Encounter (Signed)
" °  FYI Only or Action Required?: Action required by provider: request for appointment, clinical question for provider, update on patient condition, and ER Refusal for syncopal episode two weeks ago that has not been evaluated for.  Patient is followed in Pulmonology for Interstitial Lung Disease, Cough, last seen on 02/27/2024 by Kayla [facility], Kayla RAMAN, Kayla Craig.  Called Nurse Triage reporting Cough.  Symptoms began patient states cough x years.  Interventions attempted: Nothing.  Symptoms are: gradually worsening.  Triage Disposition: Go to ED Now (or PCP Triage)  Patient/caregiver understands and will follow disposition?: No, wishes to speak with PCP       Message from Kayla Craig, Kayla Craig sent at 05/06/2024 10:41 AM EST  Reason for Triage: Passed out a week ago, coughing, full of phlem   Reason for Disposition  Patient sounds very sick or weak to the triager    Shortness of breath on exertion & recent syncopal episode of unknown cause with an unknown time unresponsive---pt unsure what happened  Answer Assessment - Initial Assessment Questions Patient called and advised that she has an appointment today She states that she is unable to get out of her apartment complex Patient lives alone Patient does not have a pulse oximeter and has not had to use her inhaler any  She saw Kayla Craig in October 2026 She states she coughs up a lot of phlegm--clear, when she eats she will cough so hard until she vomits. She also added that she has passed out three times in the past year She states the last time was a few weeks ago she was in the kitchen and passed out. She woke up and was flat on her back on the kitchen floor This happened two weeks ago Patient states she has not been evaluated since then Patient was advised that if she passed out and had no idea why she passed out  Patient was advised with a syncopal episode of unknown cause and her medical history it's recommended she be seen and evaluated  as soon as possible---this RN inquired about EMS transport to the [facility]  Patient denies fever, difficulty breathing--except with exertion she will get short of breath, chest pain, dizziness, vision changes   Patient states she coughs so much she feels weak and her ribs get sore  Patient states she has been coughing for years.  Patient states she only wants to see Kayla Craig. She does not want to see another provider. Patient states she needs to see Kayla Craig as soon as possible She inquired about today's visit possibly being changed to virtual or if she needs to reschedule it to a later date This RN did not cancel the appointment set for 1:45pm today in case Pulmonary office wants to change it to virtual   Patient is advised to call us  back if anything changes or with any further questions/concerns. Patient is advised that if anything worsens to go to the Emergency Room. Patient verbalized understanding.  Protocols used: Cough - Acute Productive-A-AH  "

## 2024-05-21 ENCOUNTER — Ambulatory Visit: Admitting: Internal Medicine

## 2024-06-23 ENCOUNTER — Encounter: Admitting: Internal Medicine

## 2024-07-14 ENCOUNTER — Ambulatory Visit: Admitting: Internal Medicine

## 2024-10-12 ENCOUNTER — Ambulatory Visit
